# Patient Record
Sex: Female | Born: 1935
Health system: Southern US, Community
[De-identification: ages and names within clinical notes are randomized; demographics above are authoritative.]

## PROBLEM LIST (undated history)

## (undated) DIAGNOSIS — I10 Essential (primary) hypertension: Secondary | ICD-10-CM

## (undated) DIAGNOSIS — M810 Age-related osteoporosis without current pathological fracture: Secondary | ICD-10-CM

## (undated) DIAGNOSIS — Z85828 Personal history of other malignant neoplasm of skin: Secondary | ICD-10-CM

## (undated) DIAGNOSIS — E785 Hyperlipidemia, unspecified: Secondary | ICD-10-CM

## (undated) HISTORY — DX: Hyperlipidemia, unspecified: E78.5

## (undated) HISTORY — DX: Age-related osteoporosis without current pathological fracture: M81.0

## (undated) HISTORY — DX: Essential (primary) hypertension: I10

## (undated) HISTORY — DX: Personal history of other malignant neoplasm of skin: Z85.828

---

## 1996-12-08 HISTORY — PX: ABDOMINAL HYSTERECTOMY: SHX81

## 1998-10-04 ENCOUNTER — Other Ambulatory Visit: Admission: RE | Admit: 1998-10-04 | Discharge: 1998-10-04 | Payer: Self-pay | Admitting: Family Medicine

## 1999-02-27 ENCOUNTER — Encounter: Payer: Self-pay | Admitting: Family Medicine

## 1999-02-27 ENCOUNTER — Ambulatory Visit (HOSPITAL_COMMUNITY): Admission: RE | Admit: 1999-02-27 | Discharge: 1999-02-27 | Payer: Self-pay | Admitting: Family Medicine

## 1999-10-04 ENCOUNTER — Encounter: Payer: Self-pay | Admitting: Family Medicine

## 1999-10-04 ENCOUNTER — Ambulatory Visit (HOSPITAL_COMMUNITY): Admission: RE | Admit: 1999-10-04 | Discharge: 1999-10-04 | Payer: Self-pay | Admitting: Family Medicine

## 2000-01-26 ENCOUNTER — Encounter: Payer: Self-pay | Admitting: *Deleted

## 2000-01-26 ENCOUNTER — Emergency Department (HOSPITAL_COMMUNITY): Admission: EM | Admit: 2000-01-26 | Discharge: 2000-01-26 | Payer: Self-pay | Admitting: *Deleted

## 2000-01-26 ENCOUNTER — Encounter: Payer: Self-pay | Admitting: Emergency Medicine

## 2000-02-04 ENCOUNTER — Other Ambulatory Visit: Admission: RE | Admit: 2000-02-04 | Discharge: 2000-02-04 | Payer: Self-pay | Admitting: Family Medicine

## 2000-03-23 ENCOUNTER — Other Ambulatory Visit: Admission: RE | Admit: 2000-03-23 | Discharge: 2000-03-23 | Payer: Self-pay | Admitting: *Deleted

## 2000-05-06 ENCOUNTER — Encounter: Admission: RE | Admit: 2000-05-06 | Discharge: 2000-05-06 | Payer: Self-pay | Admitting: Oncology

## 2000-05-06 ENCOUNTER — Encounter: Payer: Self-pay | Admitting: Oncology

## 2000-05-21 ENCOUNTER — Encounter (INDEPENDENT_AMBULATORY_CARE_PROVIDER_SITE_OTHER): Payer: Self-pay | Admitting: Specialist

## 2000-05-21 ENCOUNTER — Ambulatory Visit (HOSPITAL_COMMUNITY): Admission: RE | Admit: 2000-05-21 | Discharge: 2000-05-21 | Payer: Self-pay | Admitting: Oncology

## 2002-06-27 ENCOUNTER — Other Ambulatory Visit: Admission: RE | Admit: 2002-06-27 | Discharge: 2002-06-27 | Payer: Self-pay | Admitting: Family Medicine

## 2004-10-07 ENCOUNTER — Ambulatory Visit: Payer: Self-pay | Admitting: Nurse Practitioner

## 2004-11-06 ENCOUNTER — Ambulatory Visit: Payer: Self-pay | Admitting: Nurse Practitioner

## 2004-11-11 ENCOUNTER — Encounter: Admission: RE | Admit: 2004-11-11 | Discharge: 2004-11-11 | Payer: Self-pay | Admitting: Cardiology

## 2004-12-11 ENCOUNTER — Ambulatory Visit: Payer: Self-pay | Admitting: Nurse Practitioner

## 2005-02-14 ENCOUNTER — Ambulatory Visit: Payer: Self-pay | Admitting: Oncology

## 2005-03-12 ENCOUNTER — Ambulatory Visit: Payer: Self-pay | Admitting: Nurse Practitioner

## 2005-04-07 ENCOUNTER — Encounter (INDEPENDENT_AMBULATORY_CARE_PROVIDER_SITE_OTHER): Payer: Self-pay | Admitting: Nurse Practitioner

## 2005-04-07 LAB — CONVERTED CEMR LAB: Pap Smear: NORMAL

## 2005-04-14 ENCOUNTER — Other Ambulatory Visit: Admission: RE | Admit: 2005-04-14 | Discharge: 2005-04-14 | Payer: Self-pay | Admitting: Family Medicine

## 2005-04-14 ENCOUNTER — Ambulatory Visit: Payer: Self-pay | Admitting: Nurse Practitioner

## 2005-11-05 ENCOUNTER — Ambulatory Visit: Payer: Self-pay | Admitting: Nurse Practitioner

## 2006-03-13 ENCOUNTER — Ambulatory Visit: Payer: Self-pay | Admitting: Oncology

## 2006-03-16 LAB — CBC WITH DIFFERENTIAL/PLATELET
Basophils Absolute: 0 10*3/uL (ref 0.0–0.1)
HCT: 35.9 % (ref 34.8–46.6)
HGB: 11.8 g/dL (ref 11.6–15.9)
MONO#: 0.6 10*3/uL (ref 0.1–0.9)
NEUT#: 2.7 10*3/uL (ref 1.5–6.5)
NEUT%: 60.5 % (ref 39.6–76.8)
RDW: 14.7 % — ABNORMAL HIGH (ref 11.3–14.5)
WBC: 4.5 10*3/uL (ref 3.9–10.0)
lymph#: 1.1 10*3/uL (ref 0.9–3.3)

## 2006-03-17 LAB — COMPREHENSIVE METABOLIC PANEL
ALT: 8 U/L (ref 0–40)
Albumin: 4 g/dL (ref 3.5–5.2)
BUN: 21 mg/dL (ref 6–23)
CO2: 27 mEq/L (ref 19–32)
Calcium: 9.7 mg/dL (ref 8.4–10.5)
Chloride: 103 mEq/L (ref 96–112)
Creatinine, Ser: 0.9 mg/dL (ref 0.4–1.2)
Potassium: 3.8 mEq/L (ref 3.5–5.3)

## 2006-03-17 LAB — IGG, IGA, IGM
IgA: 286 mg/dL (ref 68–378)
IgM, Serum: 36 mg/dL — ABNORMAL LOW (ref 60–263)

## 2006-03-25 ENCOUNTER — Ambulatory Visit: Payer: Self-pay | Admitting: Nurse Practitioner

## 2006-04-15 ENCOUNTER — Ambulatory Visit (HOSPITAL_COMMUNITY): Admission: RE | Admit: 2006-04-15 | Discharge: 2006-04-15 | Payer: Self-pay | Admitting: Internal Medicine

## 2006-04-15 ENCOUNTER — Encounter (INDEPENDENT_AMBULATORY_CARE_PROVIDER_SITE_OTHER): Payer: Self-pay | Admitting: Interventional Cardiology

## 2006-12-11 ENCOUNTER — Ambulatory Visit: Payer: Self-pay | Admitting: Nurse Practitioner

## 2007-02-08 ENCOUNTER — Ambulatory Visit: Payer: Self-pay | Admitting: Nurse Practitioner

## 2007-03-12 ENCOUNTER — Ambulatory Visit: Payer: Self-pay | Admitting: Oncology

## 2007-03-15 LAB — CBC WITH DIFFERENTIAL/PLATELET
Basophils Absolute: 0 10*3/uL (ref 0.0–0.1)
EOS%: 0.7 % (ref 0.0–7.0)
Eosinophils Absolute: 0 10*3/uL (ref 0.0–0.5)
HCT: 35.5 % (ref 34.8–46.6)
HGB: 12.1 g/dL (ref 11.6–15.9)
MCH: 27.6 pg (ref 26.0–34.0)
MONO#: 0.5 10*3/uL (ref 0.1–0.9)
NEUT#: 2.2 10*3/uL (ref 1.5–6.5)
RDW: 13.8 % (ref 11.3–14.5)
WBC: 3.9 10*3/uL (ref 3.9–10.0)
lymph#: 1.1 10*3/uL (ref 0.9–3.3)

## 2007-03-17 LAB — COMPREHENSIVE METABOLIC PANEL
ALT: 11 U/L (ref 0–35)
AST: 15 U/L (ref 0–37)
Albumin: 4.1 g/dL (ref 3.5–5.2)
BUN: 17 mg/dL (ref 6–23)
CO2: 26 mEq/L (ref 19–32)
Calcium: 9.4 mg/dL (ref 8.4–10.5)
Chloride: 102 mEq/L (ref 96–112)
Potassium: 3.8 mEq/L (ref 3.5–5.3)

## 2007-03-17 LAB — IMMUNOFIXATION ELECTROPHORESIS: IgG (Immunoglobin G), Serum: 2740 mg/dL — ABNORMAL HIGH (ref 694–1618)

## 2007-03-17 LAB — FERRITIN: Ferritin: 91 ng/mL (ref 10–291)

## 2007-07-26 ENCOUNTER — Encounter (INDEPENDENT_AMBULATORY_CARE_PROVIDER_SITE_OTHER): Payer: Self-pay | Admitting: Nurse Practitioner

## 2007-07-26 DIAGNOSIS — F329 Major depressive disorder, single episode, unspecified: Secondary | ICD-10-CM

## 2007-07-26 DIAGNOSIS — I10 Essential (primary) hypertension: Secondary | ICD-10-CM | POA: Insufficient documentation

## 2007-07-26 DIAGNOSIS — Z9889 Other specified postprocedural states: Secondary | ICD-10-CM

## 2007-07-26 DIAGNOSIS — F17200 Nicotine dependence, unspecified, uncomplicated: Secondary | ICD-10-CM | POA: Insufficient documentation

## 2007-07-26 DIAGNOSIS — R195 Other fecal abnormalities: Secondary | ICD-10-CM

## 2007-07-26 DIAGNOSIS — K219 Gastro-esophageal reflux disease without esophagitis: Secondary | ICD-10-CM

## 2007-07-26 DIAGNOSIS — D509 Iron deficiency anemia, unspecified: Secondary | ICD-10-CM

## 2008-04-07 ENCOUNTER — Ambulatory Visit: Payer: Self-pay | Admitting: Internal Medicine

## 2008-04-07 ENCOUNTER — Encounter (INDEPENDENT_AMBULATORY_CARE_PROVIDER_SITE_OTHER): Payer: Self-pay | Admitting: Nurse Practitioner

## 2008-04-07 LAB — CONVERTED CEMR LAB
Albumin: 4.1 g/dL (ref 3.5–5.2)
BUN: 16 mg/dL (ref 6–23)
CO2: 25 meq/L (ref 19–32)
Calcium: 9.7 mg/dL (ref 8.4–10.5)
Chloride: 103 meq/L (ref 96–112)
Cholesterol: 218 mg/dL — ABNORMAL HIGH (ref 0–200)
Creatinine, Ser: 0.81 mg/dL (ref 0.40–1.20)
HDL: 52 mg/dL (ref >39)
Hemoglobin: 11.8 g/dL — ABNORMAL LOW (ref 12.0–15.0)
Lymphocytes Relative: 33 % (ref 12–46)
Lymphs Abs: 1.4 10*3/uL (ref 0.7–4.0)
Monocytes Absolute: 0.4 10*3/uL (ref 0.1–1.0)
Monocytes Relative: 10 % (ref 3–12)
Neutro Abs: 2.4 10*3/uL (ref 1.7–7.7)
Potassium: 4.2 meq/L (ref 3.5–5.3)
RBC: 4.39 M/uL (ref 3.87–5.11)
Total CHOL/HDL Ratio: 4.2
WBC: 4.3 10*3/uL (ref 4.0–10.5)

## 2008-10-31 ENCOUNTER — Ambulatory Visit: Payer: Self-pay | Admitting: Internal Medicine

## 2008-12-06 ENCOUNTER — Encounter (INDEPENDENT_AMBULATORY_CARE_PROVIDER_SITE_OTHER): Payer: Self-pay | Admitting: Internal Medicine

## 2008-12-06 ENCOUNTER — Ambulatory Visit: Payer: Self-pay | Admitting: Internal Medicine

## 2008-12-06 LAB — CONVERTED CEMR LAB
ALT: <8 units/L (ref 0–35)
AST: 14 units/L (ref 0–37)
Albumin: 3.9 g/dL (ref 3.5–5.2)
BUN: 19 mg/dL (ref 6–23)
CO2: 22 meq/L (ref 19–32)
Calcium: 9.4 mg/dL (ref 8.4–10.5)
Chloride: 104 meq/L (ref 96–112)
Cholesterol: 169 mg/dL (ref 0–200)
Creatinine, Ser: 0.77 mg/dL (ref 0.40–1.20)
Potassium: 3.7 meq/L (ref 3.5–5.3)
Total CHOL/HDL Ratio: 3.5

## 2009-02-01 ENCOUNTER — Ambulatory Visit: Payer: Self-pay | Admitting: Internal Medicine

## 2009-02-01 ENCOUNTER — Encounter (INDEPENDENT_AMBULATORY_CARE_PROVIDER_SITE_OTHER): Payer: Self-pay | Admitting: Internal Medicine

## 2009-02-01 LAB — CONVERTED CEMR LAB
Basophils Relative: 1 % (ref 0–1)
Eosinophils Absolute: 0 10*3/uL (ref 0.0–0.7)
Eosinophils Relative: 1 % (ref 0–5)
HCT: 36.6 % (ref 36.0–46.0)
Hemoglobin: 11.8 g/dL — ABNORMAL LOW (ref 12.0–15.0)
Lymphs Abs: 1.2 10*3/uL (ref 0.7–4.0)
MCHC: 32.2 g/dL (ref 30.0–36.0)
MCV: 85.5 fL (ref 78.0–100.0)
Monocytes Absolute: 0.5 10*3/uL (ref 0.1–1.0)
Monocytes Relative: 14 % — ABNORMAL HIGH (ref 3–12)
Neutrophils Relative %: 53 % (ref 43–77)
RBC: 4.28 M/uL (ref 3.87–5.11)
WBC: 3.7 10*3/uL — ABNORMAL LOW (ref 4.0–10.5)

## 2009-02-13 ENCOUNTER — Encounter: Admission: RE | Admit: 2009-02-13 | Discharge: 2009-02-13 | Payer: Self-pay | Admitting: Internal Medicine

## 2009-02-14 ENCOUNTER — Encounter: Admission: RE | Admit: 2009-02-14 | Discharge: 2009-02-14 | Payer: Self-pay | Admitting: Internal Medicine

## 2009-02-16 ENCOUNTER — Encounter: Payer: Self-pay | Admitting: Internal Medicine

## 2009-02-16 ENCOUNTER — Ambulatory Visit: Payer: Self-pay | Admitting: Internal Medicine

## 2009-02-16 DIAGNOSIS — I10 Essential (primary) hypertension: Secondary | ICD-10-CM | POA: Insufficient documentation

## 2009-02-17 DIAGNOSIS — I498 Other specified cardiac arrhythmias: Secondary | ICD-10-CM

## 2009-05-03 ENCOUNTER — Ambulatory Visit: Payer: Self-pay | Admitting: Oncology

## 2009-05-08 LAB — CBC WITH DIFFERENTIAL/PLATELET
BASO%: 0.8 % (ref 0.0–2.0)
EOS%: 1 % (ref 0.0–7.0)
LYMPH%: 32.4 % (ref 14.0–49.7)
MCH: 27.8 pg (ref 25.1–34.0)
MCHC: 33 g/dL (ref 31.5–36.0)
MONO#: 0.5 10*3/uL (ref 0.1–0.9)
Platelets: 223 10*3/uL (ref 145–400)
RBC: 4.23 10*6/uL (ref 3.70–5.45)
WBC: 3.9 10*3/uL (ref 3.9–10.3)

## 2009-05-10 LAB — COMPREHENSIVE METABOLIC PANEL
ALT: 11 U/L (ref 0–35)
AST: 17 U/L (ref 0–37)
Alkaline Phosphatase: 67 U/L (ref 39–117)
CO2: 24 mEq/L (ref 19–32)
Sodium: 142 mEq/L (ref 135–145)
Total Bilirubin: 0.3 mg/dL (ref 0.3–1.2)
Total Protein: 8.6 g/dL — ABNORMAL HIGH (ref 6.0–8.3)

## 2009-05-10 LAB — IMMUNOFIXATION ELECTROPHORESIS

## 2010-12-27 ENCOUNTER — Encounter (INDEPENDENT_AMBULATORY_CARE_PROVIDER_SITE_OTHER): Payer: Self-pay | Admitting: *Deleted

## 2010-12-27 LAB — CONVERTED CEMR LAB
Alkaline Phosphatase: 72 units/L (ref 39–117)
BUN: 17 mg/dL (ref 6–23)
CO2: 28 meq/L (ref 19–32)
Creatinine, Ser: 1.05 mg/dL (ref 0.40–1.20)
Eosinophils Absolute: 0 10*3/uL (ref 0.0–0.7)
Glucose, Bld: 72 mg/dL (ref 70–99)
HDL: 45 mg/dL (ref >39)
Hemoglobin: 11.2 g/dL — ABNORMAL LOW (ref 12.0–15.0)
LDL Cholesterol: 120 mg/dL — ABNORMAL HIGH (ref 0–99)
Lymphs Abs: 1.3 10*3/uL (ref 0.7–4.0)
MCHC: 31.4 g/dL (ref 30.0–36.0)
MCV: 82.6 fL (ref 78.0–100.0)
Monocytes Absolute: 1 10*3/uL (ref 0.1–1.0)
Monocytes Relative: 11 % (ref 3–12)
Neutrophils Relative %: 74 % (ref 43–77)
RBC: 4.32 M/uL (ref 3.87–5.11)
Sodium: 138 meq/L (ref 135–145)
TSH: 2.588 microintl units/mL (ref 0.350–4.500)
Total Bilirubin: 0.3 mg/dL (ref 0.3–1.2)
Total Protein: 8.5 g/dL — ABNORMAL HIGH (ref 6.0–8.3)
Triglycerides: 86 mg/dL (ref <150)
VLDL: 17 mg/dL (ref 0–40)
WBC: 9.1 10*3/uL (ref 4.0–10.5)

## 2011-01-07 ENCOUNTER — Ambulatory Visit (HOSPITAL_COMMUNITY)
Admission: RE | Admit: 2011-01-07 | Discharge: 2011-01-07 | Payer: Self-pay | Source: Home / Self Care | Attending: Family Medicine | Admitting: Family Medicine

## 2013-03-15 ENCOUNTER — Encounter: Payer: Self-pay | Admitting: Internal Medicine

## 2013-03-15 ENCOUNTER — Ambulatory Visit (INDEPENDENT_AMBULATORY_CARE_PROVIDER_SITE_OTHER): Payer: Medicare Other | Admitting: Internal Medicine

## 2013-03-15 VITALS — BP 130/80 | HR 56 | Temp 97.8°F | Resp 20 | Ht 66.0 in | Wt 149.0 lb

## 2013-03-15 DIAGNOSIS — E785 Hyperlipidemia, unspecified: Secondary | ICD-10-CM

## 2013-03-15 DIAGNOSIS — I10 Essential (primary) hypertension: Secondary | ICD-10-CM

## 2013-03-15 DIAGNOSIS — M810 Age-related osteoporosis without current pathological fracture: Secondary | ICD-10-CM

## 2013-03-15 DIAGNOSIS — E559 Vitamin D deficiency, unspecified: Secondary | ICD-10-CM

## 2013-03-15 NOTE — Progress Notes (Signed)
  Subjective:    Patient ID: Jasmine Fletcher, female    DOB: 05-10-1931, 79 y.o.   MRN: [id number]  CC- htn, vit d deficiency  HPI Pt here with her daughter. She has been taking ca-vit d supplement. Reviewed her meds. Has been complaint. bp under control at home. Is trying to be careful with her diet by taking more fruits and vegetables. Walking for exercise. No falls reported. No other complaints   Review of Systems  Constitutional: Negative for fever, chills, activity change, appetite change and fatigue.  HENT: Negative.   Eyes: Negative.   Respiratory: Negative for cough and shortness of breath.   Cardiovascular: Negative.  Negative for chest pain, palpitations and leg swelling.  Gastrointestinal: Negative for abdominal pain.  Genitourinary: Negative for dysuria.  Neurological: Negative for dizziness and headaches.  Hematological: Negative for adenopathy.  Psychiatric/Behavioral: Negative.        Objective:   Physical Exam  Constitutional: She is oriented to person, place, and time. She appears well-developed and well-nourished. No distress.  HENT:  Head: Normocephalic and atraumatic.  Right Ear: External ear normal.  Left Ear: External ear normal.  Eyes: Conjunctivae and EOM are normal. Pupils are equal, round, and reactive to light.  Neck: Normal range of motion. Neck supple. No thyromegaly present.  Cardiovascular: Normal rate and regular rhythm.   Pulmonary/Chest: Effort normal and breath sounds normal.  Abdominal: Soft. Bowel sounds are normal.  Musculoskeletal: Normal range of motion.  Neurological: She is alert and oriented to person, place, and time.  Skin: Skin is warm and dry. She is not diaphoretic.  Psychiatric: She has a normal mood and affect.   BP 130/80  Pulse 56  Temp(Src) 97.8 F (36.6 C) (Oral)  Resp 20  Ht 5\' 6"  (1.676 m)  Wt 149 lb (67.586 kg)  BMI 24.06 kg/m2  SpO2 97%      Assessment & Plan:   Vitamin d def- completed vit d once a week  course. To continue ca-vitd and will add vit d 1000 u daily.  Hyperlipidemia- continue simvastatin for now. ldl at goal. To taper simvastatin to 10 mg daily if repeat lipid panel is at goal  HTN- continue hctz 25 mg daily. Monitor bp. Salt restriction  Osteoporosis- continue alendronate and ca-vit d supplemet, fall precautions  Labs ordered- cbc, cmp

## 2013-03-15 NOTE — Patient Instructions (Signed)
Start taking OTC vitamin D 1000 u daily from now.  Get blood work a week prior to your next visit

## 2013-05-17 ENCOUNTER — Other Ambulatory Visit: Payer: Self-pay | Admitting: Internal Medicine

## 2013-06-14 ENCOUNTER — Other Ambulatory Visit: Payer: Self-pay | Admitting: Geriatric Medicine

## 2013-06-14 MED ORDER — HYDROCHLOROTHIAZIDE 25 MG PO TABS: 25.0000 mg | ORAL_TABLET | Freq: Every day | ORAL | Status: DC

## 2013-06-14 MED ORDER — ALENDRONATE SODIUM 10 MG PO TABS: 10.0000 mg | ORAL_TABLET | Freq: Every day | ORAL | Status: DC

## 2013-07-14 ENCOUNTER — Other Ambulatory Visit: Payer: Medicare Other

## 2013-07-14 DIAGNOSIS — I10 Essential (primary) hypertension: Secondary | ICD-10-CM

## 2013-07-15 LAB — COMPREHENSIVE METABOLIC PANEL
Albumin/Globulin Ratio: 0.8 — ABNORMAL LOW (ref 1.1–2.5)
Albumin: 3.7 g/dL (ref 3.5–4.7)
Alkaline Phosphatase: 73 IU/L (ref 39–117)
BUN/Creatinine Ratio: 25 (ref 11–26)
BUN: 24 mg/dL (ref 8–27)
CO2: 27 mmol/L (ref 18–29)
Creatinine, Ser: 0.96 mg/dL (ref 0.57–1.00)
GFR calc Af Amer: 65 mL/min/{1.73_m2} (ref >59)
GFR calc non Af Amer: 56 mL/min/{1.73_m2} — ABNORMAL LOW (ref >59)
Globulin, Total: 4.8 g/dL — ABNORMAL HIGH (ref 1.5–4.5)
Total Bilirubin: 0.1 mg/dL (ref 0.0–1.2)

## 2013-07-15 LAB — CBC WITH DIFFERENTIAL/PLATELET
Basophils Absolute: 0 10*3/uL (ref 0.0–0.2)
Basos: 1 % (ref 0–3)
Eosinophils Absolute: 0.1 10*3/uL (ref 0.0–0.4)
Immature Grans (Abs): 0 10*3/uL (ref 0.0–0.1)
MCH: 26 pg — ABNORMAL LOW (ref 26.6–33.0)
MCHC: 31.9 g/dL (ref 31.5–35.7)
MCV: 82 fL (ref 79–97)
Monocytes Absolute: 0.7 10*3/uL (ref 0.1–0.9)
Neutrophils Absolute: 2.5 10*3/uL (ref 1.4–7.0)
Neutrophils Relative %: 47 % (ref 40–74)
RBC: 4.19 x10E6/uL (ref 3.77–5.28)

## 2013-07-19 ENCOUNTER — Ambulatory Visit (INDEPENDENT_AMBULATORY_CARE_PROVIDER_SITE_OTHER): Payer: 59 | Admitting: Internal Medicine

## 2013-07-19 ENCOUNTER — Encounter: Payer: Self-pay | Admitting: Internal Medicine

## 2013-07-19 VITALS — BP 122/82 | HR 54 | Temp 98.2°F | Resp 14 | Wt 147.4 lb

## 2013-07-19 DIAGNOSIS — I10 Essential (primary) hypertension: Secondary | ICD-10-CM

## 2013-07-19 DIAGNOSIS — M810 Age-related osteoporosis without current pathological fracture: Secondary | ICD-10-CM

## 2013-07-19 DIAGNOSIS — D649 Anemia, unspecified: Secondary | ICD-10-CM

## 2013-07-19 DIAGNOSIS — I498 Other specified cardiac arrhythmias: Secondary | ICD-10-CM

## 2013-07-19 DIAGNOSIS — E785 Hyperlipidemia, unspecified: Secondary | ICD-10-CM

## 2013-07-19 NOTE — Progress Notes (Signed)
Patient ID: Jasmine Fletcher, female   DOB: January 07, 1931, 79 y.o.   MRN: [id number]  Chief Complaint  Patient presents with  . Medical Managment of Chronic Issues   No Known Allergies  HPI Here by herself for routine visit. Appetite is fair.No falls reported. bp at home has been in normal range. Tolerating fosamax well.   Review of Systems  Constitutional: Negative for fever, chills, activity change, appetite change and fatigue.  HENT: Negative.  wears corrective lenses, has dentures at top Eyes: Negative.   Respiratory: Negative for cough and shortness of breath.   Cardiovascular: Negative for chest pain, palpitation. Has ankle swelling which is chronic and is more towards end of the day Gastrointestinal: Negative for abdominal pain, nausea and vomiting Genitourinary: Negative for dysuria.  Neurological: Negative for dizziness and headaches.  Hematological: Negative for adenopathy.  Psychiatric/Behavioral: Negative.    Past Medical History  Diagnosis Date  . Hypertension   . Osteoporosis   . Hyperlipidemia    BP 122/82  Pulse 54  Temp(Src) 98.2 F (36.8 C) (Oral)  Resp 14  Wt 147 lb 6.4 oz (66.86 kg)  BMI 23.8 kg/m2  Physical Exam  Constitutional: She is oriented to person, place, and time. She appears well-developed and well-nourished. No distress.  HENT:   Head: Normocephalic and atraumatic.  Eyes: Conjunctivae and EOM are normal. Pupils are equal, round, and reactive to light.  Neck: Normal range of motion. Neck supple. No thyromegaly present.  Cardiovascular: Normal rate and regular rhythm.   Pulmonary/Chest: Effort normal and breath sounds normal.  Abdominal: Soft. Bowel sounds are normal.  Musculoskeletal: Normal range of motion.  Neurological: She is alert and oriented to person, place, and time.  Skin: Skin is warm and dry. She is not diaphoretic.  Psychiatric: She has a normal mood and affect.   Labs- CBC    Component Value Date/Time   WBC 5.4 07/14/2013 0841    WBC 9.1 12/27/2010 2025   WBC 3.9 05/08/2009 1413   RBC 4.19 07/14/2013 0841   RBC 4.32 12/27/2010 2025   RBC 4.23 05/08/2009 1413   HGB 10.9* 07/14/2013 0841   HGB 11.8 05/08/2009 1413   HCT 34.2 07/14/2013 0841   HCT 35.6 05/08/2009 1413   PLT 277 12/27/2010 2025   PLT 223 05/08/2009 1413   MCV 82 07/14/2013 0841   MCV 84.3 05/08/2009 1413   MCH 26.0* 07/14/2013 0841   MCH 27.8 05/08/2009 1413   MCHC 31.9 07/14/2013 0841   MCHC 31.4 12/27/2010 2025   MCHC 33.0 05/08/2009 1413   RDW 14.8 07/14/2013 0841   RDW 14.5 12/27/2010 2025   RDW 13.5 05/08/2009 1413   LYMPHSABS 2.0 07/14/2013 0841   LYMPHSABS 1.3 12/27/2010 2025   LYMPHSABS 1.3 05/08/2009 1413   MONOABS 1.0 12/27/2010 2025   MONOABS 0.5 05/08/2009 1413   EOSABS 0.1 07/14/2013 0841   EOSABS 0.0 12/27/2010 2025   BASOSABS 0.0 07/14/2013 0841   BASOSABS 0.0 12/27/2010 2025   BASOSABS 0.0 05/08/2009 1413    CMP     Component Value Date/Time   NA 140 07/14/2013 0841   NA 138 12/27/2010 2025   K 3.8 07/14/2013 0841   CL 100 07/14/2013 0841   CO2 27 07/14/2013 0841   GLUCOSE 85 07/14/2013 0841   GLUCOSE 72 12/27/2010 2025   BUN 24 07/14/2013 0841   BUN 17 12/27/2010 2025   CREATININE 0.96 07/14/2013 0841   CALCIUM 9.2 07/14/2013 0841   PROT 8.5 07/14/2013 0841  PROT 8.5* 12/27/2010 2025   ALBUMIN 3.7 12/27/2010 2025   AST 17 07/14/2013 0841   ALT 10 07/14/2013 0841   ALKPHOS 73 07/14/2013 0841   BILITOT 0.1 07/14/2013 0841   GFRNONAA 56* 07/14/2013 0841   GFRAA 65 07/14/2013 0841   07/19/13 ekg sinus bradycardia, HR 51/min, PR interval 196, qtc 435   Assessment/plan  HTN- continue hctz 25 mg daily. Monitor bp. Salt restriction  Osteoporosis- continue alendronate and ca-vit d supplemet, fall precautions  Hyperlipidemia- continue simvastatin for now. ldl at goal.   Bradycardia- sinus, asymptomatic. Avoid AV nodal agents. Monitor clinically for now  Anemia- hemoglobin remains stable. Monitor clinically for now

## 2013-09-13 ENCOUNTER — Other Ambulatory Visit: Payer: Self-pay | Admitting: *Deleted

## 2013-09-13 MED ORDER — SIMVASTATIN 20 MG PO TABS: ORAL_TABLET | ORAL | Status: DC

## 2013-10-12 ENCOUNTER — Other Ambulatory Visit: Payer: Self-pay | Admitting: Internal Medicine

## 2013-10-12 NOTE — Telephone Encounter (Signed)
Last BMD on file 2012

## 2013-11-16 ENCOUNTER — Ambulatory Visit: Payer: Medicare Other | Admitting: Internal Medicine

## 2013-11-29 ENCOUNTER — Encounter: Payer: Self-pay | Admitting: Internal Medicine

## 2013-11-29 ENCOUNTER — Ambulatory Visit (INDEPENDENT_AMBULATORY_CARE_PROVIDER_SITE_OTHER): Payer: 59 | Admitting: Internal Medicine

## 2013-11-29 VITALS — BP 120/74 | HR 60 | Temp 98.1°F | Wt 149.4 lb

## 2013-11-29 DIAGNOSIS — D509 Iron deficiency anemia, unspecified: Secondary | ICD-10-CM

## 2013-11-29 DIAGNOSIS — K219 Gastro-esophageal reflux disease without esophagitis: Secondary | ICD-10-CM

## 2013-11-29 DIAGNOSIS — M810 Age-related osteoporosis without current pathological fracture: Secondary | ICD-10-CM

## 2013-11-29 DIAGNOSIS — E785 Hyperlipidemia, unspecified: Secondary | ICD-10-CM

## 2013-11-29 DIAGNOSIS — E782 Mixed hyperlipidemia: Secondary | ICD-10-CM | POA: Insufficient documentation

## 2013-11-29 DIAGNOSIS — I10 Essential (primary) hypertension: Secondary | ICD-10-CM

## 2013-11-29 NOTE — Progress Notes (Signed)
Patient ID: Jasmine Fletcher, female   DOB: 07-15-1931, 79 y.o.   MRN: [id number]    No Known Allergies  Chief Complaint  Patient presents with  . Medical Managment of Chronic Issues    4 month f/u    HPI 79 y/o female patient is here for RV. She denies any complaints. She exercise on daily basis and tries to eat healthy. No falls reported. No skin concerns  Review of Systems   Constitutional: Negative for fever, chills, activity change, appetite change and fatigue.   HENT: Negative.  wears corrective lenses, has dentures at top Eyes: Negative.    Respiratory: Negative for cough and shortness of breath.    Cardiovascular: Negative for chest pain, palpitation. Has ankle swelling which is chronic and is more towards end of the day Gastrointestinal: Negative for abdominal pain, nausea and vomiting Genitourinary: Negative for dysuria.   Neurological: Negative for dizziness and headaches.   Hematological: Negative for adenopathy.   Psychiatric/Behavioral: Negative.    Past Medical History  Diagnosis Date  . Hypertension   . Osteoporosis   . Hyperlipidemia    Medication reviewed. See [facility]  Past Surgical History  Procedure Laterality Date  . Abdominal hysterectomy  1998   Physical exam BP 120/74  Pulse 60  Temp(Src) 98.1 F (36.7 C) (Oral)  Wt 149 lb 6.4 oz (67.767 kg)  SpO2 99%  General- elderly female in no acute distress Head- atraumatic, normocephalic Eyes- PERRLA, EOMI, no pallor, no icterus, no discharge Neck- no lymphadenopathy, no thyromegaly, no jugular vein distension, no carotid bruit Ears- left ear normal tympanic membrane and normal external ear canal , right ear normal tympanic membrane and normal external ear canal Cardiovascular- normal s1,s2, no murmurs/ rubs/ gallops Respiratory- bilateral clear to auscultation, no wheeze, no rhonchi, no crackles Abdomen- bowel sounds present, soft, non tender Musculoskeletal- able to move all 4 extremities Neurological-  no focal deficit Psychiatry- alert and oriented to person, place and time, normal mood and affect  Labs reviewed CBC    Component Value Date/Time   WBC 5.4 07/14/2013 0841   WBC 9.1 12/27/2010 2025   WBC 3.9 05/08/2009 1413   RBC 4.19 07/14/2013 0841   RBC 4.32 12/27/2010 2025   RBC 4.23 05/08/2009 1413   HGB 10.9* 07/14/2013 0841   HGB 11.8 05/08/2009 1413   HCT 34.2 07/14/2013 0841   HCT 35.6 05/08/2009 1413   PLT 277 12/27/2010 2025   PLT 223 05/08/2009 1413   MCV 82 07/14/2013 0841   MCV 84.3 05/08/2009 1413   MCH 26.0* 07/14/2013 0841   MCH 27.8 05/08/2009 1413   MCHC 31.9 07/14/2013 0841   MCHC 31.4 12/27/2010 2025   MCHC 33.0 05/08/2009 1413   RDW 14.8 07/14/2013 0841   RDW 14.5 12/27/2010 2025   RDW 13.5 05/08/2009 1413   LYMPHSABS 2.0 07/14/2013 0841   LYMPHSABS 1.3 12/27/2010 2025   LYMPHSABS 1.3 05/08/2009 1413   MONOABS 1.0 12/27/2010 2025   MONOABS 0.5 05/08/2009 1413   EOSABS 0.1 07/14/2013 0841   EOSABS 0.0 12/27/2010 2025   BASOSABS 0.0 07/14/2013 0841   BASOSABS 0.0 12/27/2010 2025   BASOSABS 0.0 05/08/2009 1413    CMP     Component Value Date/Time   NA 140 07/14/2013 0841   NA 138 12/27/2010 2025   K 3.8 07/14/2013 0841   CL 100 07/14/2013 0841   CO2 27 07/14/2013 0841   GLUCOSE 85 07/14/2013 0841   GLUCOSE 72 12/27/2010 2025   BUN 24  07/14/2013 0841   BUN 17 12/27/2010 2025   CREATININE 0.96 07/14/2013 0841   CALCIUM 9.2 07/14/2013 0841   PROT 8.5 07/14/2013 0841   PROT 8.5* 12/27/2010 2025   ALBUMIN 3.7 12/27/2010 2025   AST 17 07/14/2013 0841   ALT 10 07/14/2013 0841   ALKPHOS 73 07/14/2013 0841   BILITOT 0.1 07/14/2013 0841   GFRNONAA 56* 07/14/2013 0841   GFRAA 65 07/14/2013 0841    Assessment/plan  1. HYPERTENSION bp well controlled. Continue hydrochlorthiazide and aspirin - CMP; Future - CBC with Differential; Future  2. GERD Currently off all medication and symptoms under control  3. Osteoporosis, unspecified Continue alendronate with calcium and vitamin d, fall precautions and weight bearing  exercise  4. ANEMIA, HYPOCHROMIC Check cbc in a week to rule out worsening anemia. Currently asymptomatic. Continue MVI - CBC with Differential; Future  5. Other and unspecified hyperlipidemia Continue zocor 20 mg daily. Pt not fasting currently. Will check flp in 1-2 weeks with cmp - Lipid Panel; Future

## 2014-01-11 ENCOUNTER — Other Ambulatory Visit: Payer: Self-pay | Admitting: Internal Medicine

## 2014-01-29 ENCOUNTER — Emergency Department (HOSPITAL_COMMUNITY): Payer: PRIVATE HEALTH INSURANCE

## 2014-01-29 ENCOUNTER — Emergency Department (HOSPITAL_COMMUNITY)
Admission: EM | Admit: 2014-01-29 | Discharge: 2014-01-29 | Disposition: A | Discharge status: Home / Self Care | Payer: PRIVATE HEALTH INSURANCE | Attending: Emergency Medicine | Admitting: Emergency Medicine

## 2014-01-29 ENCOUNTER — Encounter (HOSPITAL_COMMUNITY): Payer: Self-pay | Admitting: Emergency Medicine

## 2014-01-29 DIAGNOSIS — I10 Essential (primary) hypertension: Secondary | ICD-10-CM | POA: Insufficient documentation

## 2014-01-29 DIAGNOSIS — D649 Anemia, unspecified: Secondary | ICD-10-CM

## 2014-01-29 DIAGNOSIS — R42 Dizziness and giddiness: Secondary | ICD-10-CM | POA: Insufficient documentation

## 2014-01-29 DIAGNOSIS — J069 Acute upper respiratory infection, unspecified: Secondary | ICD-10-CM | POA: Insufficient documentation

## 2014-01-29 DIAGNOSIS — Z79899 Other long term (current) drug therapy: Secondary | ICD-10-CM | POA: Insufficient documentation

## 2014-01-29 DIAGNOSIS — Z8739 Personal history of other diseases of the musculoskeletal system and connective tissue: Secondary | ICD-10-CM | POA: Insufficient documentation

## 2014-01-29 DIAGNOSIS — B9789 Other viral agents as the cause of diseases classified elsewhere: Secondary | ICD-10-CM

## 2014-01-29 DIAGNOSIS — R110 Nausea: Secondary | ICD-10-CM | POA: Insufficient documentation

## 2014-01-29 DIAGNOSIS — R630 Anorexia: Secondary | ICD-10-CM | POA: Insufficient documentation

## 2014-01-29 DIAGNOSIS — E860 Dehydration: Secondary | ICD-10-CM | POA: Insufficient documentation

## 2014-01-29 DIAGNOSIS — Z87891 Personal history of nicotine dependence: Secondary | ICD-10-CM | POA: Insufficient documentation

## 2014-01-29 DIAGNOSIS — E785 Hyperlipidemia, unspecified: Secondary | ICD-10-CM | POA: Insufficient documentation

## 2014-01-29 LAB — URINALYSIS, ROUTINE W REFLEX MICROSCOPIC
Bilirubin Urine: NEGATIVE
GLUCOSE, UA: NEGATIVE mg/dL
Ketones, ur: NEGATIVE mg/dL
LEUKOCYTES UA: NEGATIVE
Nitrite: NEGATIVE
Protein, ur: NEGATIVE mg/dL
SPECIFIC GRAVITY, URINE: 1.008 (ref 1.005–1.030)
Urobilinogen, UA: 0.2 mg/dL (ref 0.0–1.0)
pH: 6.5 (ref 5.0–8.0)

## 2014-01-29 LAB — COMPREHENSIVE METABOLIC PANEL
ALBUMIN: 3.2 g/dL — AB (ref 3.5–5.2)
ALT: 10 U/L (ref 0–35)
AST: 17 U/L (ref 0–37)
Alkaline Phosphatase: 69 U/L (ref 39–117)
BILIRUBIN TOTAL: 0.2 mg/dL — AB (ref 0.3–1.2)
BUN: 25 mg/dL — AB (ref 6–23)
CALCIUM: 9.1 mg/dL (ref 8.4–10.5)
CHLORIDE: 96 meq/L (ref 96–112)
CO2: 26 mEq/L (ref 19–32)
CREATININE: 0.93 mg/dL (ref 0.50–1.10)
GFR calc Af Amer: 66 mL/min — ABNORMAL LOW (ref >90)
GFR calc non Af Amer: 57 mL/min — ABNORMAL LOW (ref >90)
Glucose, Bld: 99 mg/dL (ref 70–99)
Potassium: 3.5 mEq/L — ABNORMAL LOW (ref 3.7–5.3)
Sodium: 135 mEq/L — ABNORMAL LOW (ref 137–147)
TOTAL PROTEIN: 9.5 g/dL — AB (ref 6.0–8.3)

## 2014-01-29 LAB — CBC WITH DIFFERENTIAL/PLATELET
BASOS ABS: 0 10*3/uL (ref 0.0–0.1)
Basophils Relative: 1 % (ref 0–1)
EOS ABS: 0 10*3/uL (ref 0.0–0.7)
EOS PCT: 0 % (ref 0–5)
HEMATOCRIT: 32.9 % — AB (ref 36.0–46.0)
HEMOGLOBIN: 10.6 g/dL — AB (ref 12.0–15.0)
Lymphocytes Relative: 33 % (ref 12–46)
Lymphs Abs: 1.4 10*3/uL (ref 0.7–4.0)
MCH: 26 pg (ref 26.0–34.0)
MCHC: 32.2 g/dL (ref 30.0–36.0)
MCV: 80.8 fL (ref 78.0–100.0)
MONO ABS: 0.6 10*3/uL (ref 0.1–1.0)
MONOS PCT: 14 % — AB (ref 3–12)
Neutro Abs: 2.1 10*3/uL (ref 1.7–7.7)
Neutrophils Relative %: 51 % (ref 43–77)
Platelets: 200 10*3/uL (ref 150–400)
RBC: 4.07 MIL/uL (ref 3.87–5.11)
RDW: 14 % (ref 11.5–15.5)
WBC: 4 10*3/uL (ref 4.0–10.5)

## 2014-01-29 LAB — URINE MICROSCOPIC-ADD ON

## 2014-01-29 MED ORDER — POTASSIUM CHLORIDE CRYS ER 20 MEQ PO TBCR
40.0000 meq | EXTENDED_RELEASE_TABLET | Freq: Once | ORAL | Status: AC
  Administered 2014-01-29: 40 meq via ORAL
  Filled 2014-01-29: qty 2

## 2014-01-29 MED ORDER — SODIUM CHLORIDE 0.9 % IV BOLUS (SEPSIS)
1000.0000 mL | Freq: Once | INTRAVENOUS | Status: AC
  Administered 2014-01-29: 1000 mL via INTRAVENOUS

## 2014-01-29 MED ORDER — ONDANSETRON HCL 4 MG/2ML IJ SOLN
4.0000 mg | Freq: Once | INTRAMUSCULAR | Status: AC
  Administered 2014-01-29: 4 mg via INTRAVENOUS
  Filled 2014-01-29: qty 2

## 2014-01-29 MED ORDER — ONDANSETRON HCL 4 MG PO TABS: 4.0000 mg | ORAL_TABLET | Freq: Four times a day (QID) | ORAL | Status: DC

## 2014-01-29 MED ORDER — BENZONATATE 100 MG PO CAPS: 100.0000 mg | ORAL_CAPSULE | Freq: Three times a day (TID) | ORAL | Status: DC

## 2014-01-29 NOTE — ED Notes (Signed)
Bed: YQ03 Expected date: 01/29/14 Expected time: 9:59 AM Means of arrival:  Comments: Flu like symptoms

## 2014-01-29 NOTE — ED Provider Notes (Signed)
CSN: [id number]     Arrival date & time 01/29/14  1001 History   First MD Initiated Contact with Patient 01/29/14 1017     Chief Complaint  Patient presents with  . Weakness  . Dizziness  . Nausea     (Consider location/radiation/quality/duration/timing/severity/associated sxs/prior Treatment) Patient is a 80 y.o. female presenting with URI. The history is provided by the patient and a relative. No language interpreter was used.  URI Presenting symptoms: congestion, cough, fatigue and rhinorrhea   Presenting symptoms: no fever and no sore throat   Congestion:    Location:  Nasal and chest   Interferes with sleep: no     Interferes with eating/drinking: no   Cough:    Cough characteristics:  Dry   Severity:  Moderate   Onset quality:  Gradual   Duration:  1 week   Timing:  Constant   Progression:  Unchanged   Chronicity:  New Severity:  Moderate Onset quality:  Gradual Duration:  1 week Timing:  Constant Progression:  Unchanged Chronicity:  New Relieved by:  Nothing Worsened by:  Nothing tried Ineffective treatments:  None tried Associated symptoms: no arthralgias, no headaches, no myalgias, no neck pain, no sinus pain, no sneezing, no swollen glands and no wheezing   Associated symptoms comment:  Generalized weakness, 3 episodes of lightheadedness while standing.  Risk factors: being elderly and sick contacts   Risk factors: no diabetes mellitus, no immunosuppression, no recent illness and no recent travel     Past Medical History  Diagnosis Date  . Hypertension   . Osteoporosis   . Hyperlipidemia    Past Surgical History  Procedure Laterality Date  . Abdominal hysterectomy  1998   Family History  Problem Relation Age of Onset  . Cancer Father    History  Substance Use Topics  . Smoking status: Former Smoker -- 1.00 packs/day  . Smokeless tobacco: Not on file  . Alcohol Use: No   OB History   Grav Para Term Preterm Abortions TAB SAB Ect Mult Living               Review of Systems  Constitutional: Positive for fatigue. Negative for fever, chills, diaphoresis, activity change and appetite change.  HENT: Positive for congestion and rhinorrhea. Negative for facial swelling, sneezing and sore throat.   Eyes: Negative for photophobia and discharge.  Respiratory: Positive for cough. Negative for chest tightness, shortness of breath and wheezing.   Cardiovascular: Negative for chest pain, palpitations and leg swelling.  Gastrointestinal: Negative for nausea, vomiting, abdominal pain and diarrhea.  Endocrine: Negative for polydipsia and polyuria.  Genitourinary: Negative for dysuria, frequency, difficulty urinating and pelvic pain.  Musculoskeletal: Negative for arthralgias, back pain, myalgias, neck pain and neck stiffness.  Skin: Negative for color change and wound.  Allergic/Immunologic: Negative for immunocompromised state.  Neurological: Negative for facial asymmetry, weakness, numbness and headaches.  Hematological: Does not bruise/bleed easily.  Psychiatric/Behavioral: Negative for confusion and agitation.      Allergies  Review of patient's allergies indicates no known allergies.  Home Medications   Current Outpatient Rx  Name  Route  Sig  Dispense  Refill  . alendronate (FOSAMAX) 10 MG tablet   Oral   Take 10 mg by mouth daily before breakfast. BONE DENSITY DUE, 1 by mouth daily on empty stomach with a FULL GLASS OF WATER         . hydrochlorothiazide (HYDRODIURIL) 25 MG tablet   Oral   Take 25 mg  by mouth daily.         . simvastatin (ZOCOR) 20 MG tablet   Oral   Take 20 mg by mouth daily.         . benzonatate (TESSALON) 100 MG capsule   Oral   Take 1 capsule (100 mg total) by mouth every 8 (eight) hours.   21 capsule   0   . ondansetron (ZOFRAN) 4 MG tablet   Oral   Take 1 tablet (4 mg total) by mouth every 6 (six) hours.   12 tablet   0    BP 149/72  Pulse 68  Temp(Src) 97.9 F (36.6 C) (Oral)   Resp 16  SpO2 96% Physical Exam  Constitutional: She is oriented to person, place, and time. She appears well-developed and well-nourished. No distress.  HENT:  Head: Normocephalic and atraumatic.  Mouth/Throat: No oropharyngeal exudate.  Eyes: Pupils are equal, round, and reactive to light.  Neck: Normal range of motion. Neck supple.  Cardiovascular: Normal rate, regular rhythm and normal heart sounds.  Exam reveals no gallop and no friction rub.   No murmur heard. Pulmonary/Chest: Effort normal and breath sounds normal. No respiratory distress. She has no wheezes. She has no rales.  Abdominal: Soft. Bowel sounds are normal. She exhibits no distension and no mass. There is no tenderness. There is no rebound and no guarding.  Musculoskeletal: Normal range of motion. She exhibits no edema and no tenderness.  Neurological: She is alert and oriented to person, place, and time. She has normal strength. She displays no tremor. No cranial nerve deficit or sensory deficit. She exhibits normal muscle tone. She displays a negative Romberg sign. Coordination and gait normal. GCS eye subscore is 4. GCS verbal subscore is 5. GCS motor subscore is 6.  Skin: Skin is warm and dry.  Psychiatric: She has a normal mood and affect.    ED Course  Procedures (including critical care time) Labs Review Labs Reviewed  CBC WITH DIFFERENTIAL - Abnormal; Notable for the following:    Hemoglobin 10.6 (*)    HCT 32.9 (*)    Monocytes Relative 14 (*)    All other components within normal limits  COMPREHENSIVE METABOLIC PANEL - Abnormal; Notable for the following:    Sodium 135 (*)    Potassium 3.5 (*)    BUN 25 (*)    Total Protein 9.5 (*)    Albumin 3.2 (*)    Total Bilirubin 0.2 (*)    GFR calc non Af Amer 57 (*)    GFR calc Af Amer 66 (*)    All other components within normal limits  URINALYSIS, ROUTINE W REFLEX MICROSCOPIC - Abnormal; Notable for the following:    Hgb urine dipstick MODERATE (*)    All  other components within normal limits  URINE CULTURE  URINE MICROSCOPIC-ADD ON   Imaging Review Dg Chest 2 View  01/29/2014   CLINICAL DATA:  Cough and congestion for 1 week  EXAM: CHEST  2 VIEW  COMPARISON:  None.  FINDINGS: Mild cardiac enlargement. No pleural effusion or edema identified. No airspace consolidation. The lungs are hyperinflated and there are coarsened interstitial markings bilaterally. Biapical scarring is noted. On the lateral radiograph there is an indeterminate nodular opacity in the projection of the thoracic spine measuring 2.1 cm.  IMPRESSION: 1. No acute findings. 2. Indeterminate nodular density in the projection of the upper thoracic spine. Suggest further assessment with nonemergent CT of the chest. 3. COPD.   Electronically  Signed   By: Kerby Moors M.D.   On: 01/29/2014 11:26    EKG Interpretation   None       MDM   Final diagnoses:  Viral URI with cough  Lightheadedness  Mild dehydration  Anemia    Pt is a 80 y.o. female with Pmhx as above who presents with about 1 week of dry cough, congestion, rhinorrhea, anorexia, generalized weakness, and now with several episodes of lightheadedness while standing. No CP, SOB, palpitations, syncope, n/v, d/a, urinary symptoms. On PE, VSS pt in NAD.  No focal neuro findings, though pt did have lightheadedness that was exhaustable when standing. W/U with mild hyponatermia, hypokalemia, mild BUN elevation. CXR negative.  Suspect viral URI/influenza-like illness.  Pt given 1L NS, PO KCL, then ambulated to bathroom several times w/o difficulty.  Will d/c home w/ rx for tessalon & zofran.  Have rec supportive care. Return precautions given for new or worsening symptoms including SOB, CP, inability to tolerate PO.          Neta Ehlers, MD 01/29/14 1800

## 2014-01-29 NOTE — ED Notes (Signed)
Ambulated Pt to restroom, She did very well ambulating to and from..no need for much assistance.

## 2014-01-29 NOTE — Discharge Instructions (Signed)
Dizziness Dizziness is a common problem. It is a feeling of unsteadiness or lightheadedness. You may feel like you are about to faint. Dizziness can lead to injury if you stumble or fall. A person of any age group can suffer from dizziness, but dizziness is more common in older adults. CAUSES  Dizziness can be caused by many different things, including:  Middle ear problems.  Standing for too long.  Infections.  An allergic reaction.  Aging.  An emotional response to something, such as the sight of blood.  Side effects of medicines.  Fatigue.  Problems with circulation or blood pressure.  Excess use of alcohol, medicines, or illegal drug use.  Breathing too fast (hyperventilation).  An arrhythmia or problems with your heart rhythm.  Low red blood cell count (anemia).  Pregnancy.  Vomiting, diarrhea, fever, or other illnesses that cause dehydration.  Diseases or conditions such as Parkinson's disease, high blood pressure (hypertension), diabetes, and thyroid problems.  Exposure to extreme heat. DIAGNOSIS  To find the cause of your dizziness, your caregiver may do a physical exam, lab tests, radiologic imaging scans, or an electrocardiography test (ECG).  TREATMENT  Treatment of dizziness depends on the cause of your symptoms and can vary greatly. HOME CARE INSTRUCTIONS   Drink enough fluids to keep your urine clear or pale yellow. This is especially important in very hot weather. In the elderly, it is also important in cold weather.  If your dizziness is caused by medicines, take them exactly as directed. When taking blood pressure medicines, it is especially important to get up slowly.  Rise slowly from chairs and steady yourself until you feel okay.  In the morning, first sit up on the side of the bed. When this seems okay, stand slowly while holding onto something until you know your balance is fine.  If you need to stand in one place for a long time, be sure to  move your legs often. Tighten and relax the muscles in your legs while standing.  If dizziness continues to be a problem, have someone stay with you for a day or two. Do this until you feel you are well enough to stay alone. Have the person call your caregiver if he or she notices changes in you that are concerning.  Do not drive or use heavy machinery if you feel dizzy.  Do not drink alcohol. SEEK IMMEDIATE MEDICAL CARE IF:   Your dizziness or lightheadedness gets worse.  You feel nauseous or vomit.  You develop problems with talking, walking, weakness, or using your arms, hands, or legs.  You are not thinking clearly or you have difficulty forming sentences. It may take a friend or family member to determine if your thinking is normal.  You develop chest pain, abdominal pain, shortness of breath, or sweating.  Your vision changes.  You notice any bleeding.  You have side effects from medicine that seems to be getting worse rather than better. MAKE SURE YOU:   Understand these instructions.  Will watch your condition.  Will get help right away if you are not doing well or get worse. Document Released: 05/20/2001 Document Revised: 02/16/2012 Document Reviewed: 06/13/2011 [facility] Patient Information 2014 [place], [place]. Upper Respiratory Infection, Adult An upper respiratory infection (URI) is also known as the common cold. It is often caused by a type of germ (virus). Colds are easily spread (contagious). You can pass it to others by kissing, coughing, sneezing, or drinking out of the same glass. Usually, you  get better in 1 or 2 weeks.  HOME CARE   Only take medicine as told by your doctor.  Use a warm mist humidifier or breathe in steam from a hot shower.  Drink enough water and fluids to keep your pee (urine) clear or pale yellow.  Get plenty of rest.  Return to work when your temperature is back to normal or as told by your doctor. You may use a face mask and wash  your hands to stop your cold from spreading. GET HELP RIGHT AWAY IF:   After the first few days, you feel you are getting worse.  You have questions about your medicine.  You have chills, shortness of breath, or brown or red spit (mucus).  You have yellow or brown snot (nasal discharge) or pain in the face, especially when you bend forward.  You have a fever, puffy (swollen) neck, pain when you swallow, or white spots in the back of your throat.  You have a bad headache, ear pain, sinus pain, or chest pain.  You have a high-pitched whistling sound when you breathe in and out (wheezing).  You have a lasting cough or cough up blood.  You have sore muscles or a stiff neck. MAKE SURE YOU:   Understand these instructions.  Will watch your condition.  Will get help right away if you are not doing well or get worse. Document Released: 05/12/2008 Document Revised: 02/16/2012 Document Reviewed: 03/31/2011 [facility] Patient Information 2014 [place], [place].

## 2014-01-29 NOTE — ED Notes (Signed)
Pt from home c/o dry cough, nausea, inability to eat, weakness x3 days. Pt denies CP, SOB, abd pain, dysuria, frequency. Pt is A&O and in NAD

## 2014-01-29 NOTE — ED Notes (Signed)
Per EMS-pt from home with c/o of genearlized weakness, nausea when eating, dizziness upon standing. Denies chest pain, SOB. CBG 120.

## 2014-01-30 LAB — URINE CULTURE
Colony Count: NO GROWTH
Culture: NO GROWTH

## 2014-02-01 ENCOUNTER — Ambulatory Visit (INDEPENDENT_AMBULATORY_CARE_PROVIDER_SITE_OTHER): Payer: 59 | Admitting: Nurse Practitioner

## 2014-02-01 ENCOUNTER — Encounter: Payer: Self-pay | Admitting: Nurse Practitioner

## 2014-02-01 VITALS — BP 118/78 | HR 68 | Temp 97.8°F | Resp 16 | Wt 145.6 lb

## 2014-02-01 DIAGNOSIS — B9789 Other viral agents as the cause of diseases classified elsewhere: Principal | ICD-10-CM

## 2014-02-01 DIAGNOSIS — J069 Acute upper respiratory infection, unspecified: Secondary | ICD-10-CM

## 2014-02-01 NOTE — Progress Notes (Signed)
Patient ID: Jasmine Fletcher, female   DOB: [birth year], 80 y.o.   MRN: [id number]    PCP: Blanchie Serve, MD  No Known Allergies  Chief Complaint  Patient presents with  . Hospitalization Follow-up    ER f/u from 01/29/14 cough/Anemia/lightheadedness/mild dehydration/SOB    HPI:  Pt is a 80 y.o. female with Pmhx of HTN, OP, anemia, previous smoker, hyperlipidemia who went to the ED on 01/29/14  with about 1 week of dry cough, congestion, rhinorrhea, anorexia, generalized weakness, and now with several episodes of lightheadedness while standing; ED doctor Suspect viral URI/influenza-like illness and she was given 1L NS, PO KCL, then ambulated to bathroom several times w/o difficulty.  was d/c home w/ rx for tessalon & zofran and was rec supportive care. She is here today to follow up; doing okay but still feels weak has been laying around since she has not felt well. Cough and congestion has improved; able to maintain good PO intake.    Review of Systems:  Review of Systems  Constitutional: Negative for fever, chills and malaise/fatigue.  HENT: Negative for congestion and sore throat.   Respiratory: Positive for cough. Negative for shortness of breath and wheezing.   Cardiovascular: Negative for chest pain and palpitations.  Gastrointestinal: Negative for nausea, vomiting, abdominal pain, diarrhea and constipation.  Genitourinary: Negative for dysuria and urgency.  Musculoskeletal: Negative for myalgias.  Neurological: Positive for weakness. Negative for dizziness and headaches.     Past Medical History  Diagnosis Date  . Hypertension   . Osteoporosis   . Hyperlipidemia    Past Surgical History  Procedure Laterality Date  . Abdominal hysterectomy  1998   Social History:   reports that she has quit smoking. She does not have any smokeless tobacco history on file. She reports that she does not drink alcohol. Her drug history is not on file.  Family History  Problem Relation Age  of Onset  . Cancer Father     Medications: Patient's Medications  New Prescriptions   No medications on file  Previous Medications   ALENDRONATE (FOSAMAX) 10 MG TABLET    Take 10 mg by mouth daily before breakfast. BONE DENSITY DUE, 1 by mouth daily on empty stomach with a FULL GLASS OF WATER   BENZONATATE (TESSALON) 100 MG CAPSULE    Take 1 capsule (100 mg total) by mouth every 8 (eight) hours.   HYDROCHLOROTHIAZIDE (HYDRODIURIL) 25 MG TABLET    Take 25 mg by mouth daily.   ONDANSETRON (ZOFRAN) 4 MG TABLET    Take 1 tablet (4 mg total) by mouth every 6 (six) hours.   SIMVASTATIN (ZOCOR) 20 MG TABLET    Take 20 mg by mouth daily.  Modified Medications   No medications on file  Discontinued Medications   No medications on file     Physical Exam:  Filed Vitals:   02/01/14 0929  BP: 118/78  Pulse: 68  Temp: 97.8 F (36.6 C)  TempSrc: Oral  Resp: 16  Weight: 145 lb 9.6 oz (66.044 kg)  SpO2: 97%    Physical Exam  Vitals reviewed. Constitutional: She is well-developed, well-nourished, and in no distress.  HENT:  Mouth/Throat: Oropharynx is clear and moist. No oropharyngeal exudate.  Eyes: Conjunctivae and EOM are normal. Pupils are equal, round, and reactive to light.  Neck: Normal range of motion. Neck supple.  Cardiovascular: Normal rate, regular rhythm and normal heart sounds.   Pulmonary/Chest: Effort normal and breath sounds normal. No respiratory distress.  Abdominal: Soft. Bowel sounds are normal. She exhibits no distension.  Musculoskeletal: She exhibits no edema and no tenderness.  Neurological: She is alert.  Skin: Skin is warm and dry.  Psychiatric: Affect normal.     Labs reviewed: Basic Metabolic Panel:  Recent Labs  07/14/13 0841 01/29/14 1024  NA 140 135*  K 3.8 3.5*  CL 100 96  CO2 27 26  GLUCOSE 85 99  BUN 24 25*  CREATININE 0.96 0.93  CALCIUM 9.2 9.1   Liver Function Tests:  Recent Labs  07/14/13 0841 01/29/14 1024  AST 17 17  ALT  10 10  ALKPHOS 73 69  BILITOT 0.1 0.2*  PROT 8.5 9.5*  ALBUMIN  --  3.2*   CBC:  Recent Labs  07/14/13 0841 01/29/14 1024  WBC 5.4 4.0  NEUTROABS 2.5 2.1  HGB 10.9* 10.6*  HCT 34.2 32.9*  MCV 82 80.8  PLT  --  200     Assessment/Plan  1. Viral URI with cough -improved; cont symptom management -to increase activity slowly -encouraged good nutrition and PO intake - Basic metabolic panel to follow up electrolytes -discussed return precautions

## 2014-02-01 NOTE — Patient Instructions (Signed)
Increase activity slowly; eat 3 meals a day with snacks; important to have good nutrition to help you regain strength  Follow up if symptoms get worse   Will get blood work today

## 2014-02-02 LAB — BASIC METABOLIC PANEL
BUN/Creatinine Ratio: 17 (ref 11–26)
BUN: 16 mg/dL (ref 8–27)
CO2: 25 mmol/L (ref 18–29)
Calcium: 8.8 mg/dL (ref 8.7–10.3)
Chloride: 92 mmol/L — ABNORMAL LOW (ref 97–108)
Creatinine, Ser: 0.93 mg/dL (ref 0.57–1.00)
GFR, EST AFRICAN AMERICAN: 67 mL/min/{1.73_m2} (ref >59)
GFR, EST NON AFRICAN AMERICAN: 58 mL/min/{1.73_m2} — AB (ref >59)
Glucose: 97 mg/dL (ref 65–99)
POTASSIUM: 3.8 mmol/L (ref 3.5–5.2)
Sodium: 133 mmol/L — ABNORMAL LOW (ref 134–144)

## 2014-02-08 ENCOUNTER — Other Ambulatory Visit: Payer: Self-pay | Admitting: Internal Medicine

## 2014-02-08 ENCOUNTER — Encounter: Payer: Self-pay | Admitting: *Deleted

## 2014-03-24 ENCOUNTER — Other Ambulatory Visit: Payer: 59

## 2014-03-24 DIAGNOSIS — I10 Essential (primary) hypertension: Secondary | ICD-10-CM

## 2014-03-24 DIAGNOSIS — D509 Iron deficiency anemia, unspecified: Secondary | ICD-10-CM

## 2014-03-24 DIAGNOSIS — E785 Hyperlipidemia, unspecified: Secondary | ICD-10-CM

## 2014-03-25 LAB — CBC WITH DIFFERENTIAL/PLATELET
Basophils Absolute: 0 10*3/uL (ref 0.0–0.2)
Basos: 1 %
EOS: 1 %
Eosinophils Absolute: 0 10*3/uL (ref 0.0–0.4)
HEMATOCRIT: 33.2 % — AB (ref 34.0–46.6)
Hemoglobin: 10.9 g/dL — ABNORMAL LOW (ref 11.1–15.9)
Immature Grans (Abs): 0 10*3/uL (ref 0.0–0.1)
Immature Granulocytes: 0 %
LYMPHS ABS: 1.2 10*3/uL (ref 0.7–3.1)
Lymphs: 28 %
MCH: 26.4 pg — ABNORMAL LOW (ref 26.6–33.0)
MCHC: 32.8 g/dL (ref 31.5–35.7)
MCV: 80 fL (ref 79–97)
Monocytes Absolute: 0.7 10*3/uL (ref 0.1–0.9)
Monocytes: 17 %
NEUTROS ABS: 2.3 10*3/uL (ref 1.4–7.0)
Neutrophils Relative %: 53 %
RBC: 4.13 x10E6/uL (ref 3.77–5.28)
RDW: 15.2 % (ref 12.3–15.4)
WBC: 4.3 10*3/uL (ref 3.4–10.8)

## 2014-03-25 LAB — LIPID PANEL
CHOL/HDL RATIO: 3.2 ratio (ref 0.0–4.4)
Cholesterol, Total: 145 mg/dL (ref 100–199)
HDL: 46 mg/dL (ref >39)
LDL Calculated: 74 mg/dL (ref 0–99)
Triglycerides: 127 mg/dL (ref 0–149)
VLDL Cholesterol Cal: 25 mg/dL (ref 5–40)

## 2014-03-25 LAB — COMPREHENSIVE METABOLIC PANEL
A/G RATIO: 0.7 — AB (ref 1.1–2.5)
ALK PHOS: 67 IU/L (ref 39–117)
ALT: 9 IU/L (ref 0–32)
AST: 18 IU/L (ref 0–40)
Albumin: 3.6 g/dL (ref 3.5–4.7)
BUN/Creatinine Ratio: 20 (ref 11–26)
BUN: 17 mg/dL (ref 8–27)
CALCIUM: 9.6 mg/dL (ref 8.7–10.3)
CO2: 30 mmol/L — ABNORMAL HIGH (ref 18–29)
CREATININE: 0.84 mg/dL (ref 0.57–1.00)
Chloride: 98 mmol/L (ref 97–108)
GFR, EST AFRICAN AMERICAN: 76 mL/min/{1.73_m2} (ref >59)
GFR, EST NON AFRICAN AMERICAN: 66 mL/min/{1.73_m2} (ref >59)
GLOBULIN, TOTAL: 4.9 g/dL — AB (ref 1.5–4.5)
Glucose: 82 mg/dL (ref 65–99)
Potassium: 3.9 mmol/L (ref 3.5–5.2)
Sodium: 139 mmol/L (ref 134–144)
Total Bilirubin: 0.3 mg/dL (ref 0.0–1.2)
Total Protein: 8.5 g/dL (ref 6.0–8.5)

## 2014-03-28 ENCOUNTER — Ambulatory Visit (INDEPENDENT_AMBULATORY_CARE_PROVIDER_SITE_OTHER): Payer: 59 | Admitting: Internal Medicine

## 2014-03-28 ENCOUNTER — Encounter: Payer: Self-pay | Admitting: Internal Medicine

## 2014-03-28 ENCOUNTER — Other Ambulatory Visit: Payer: Self-pay | Admitting: *Deleted

## 2014-03-28 VITALS — BP 140/82 | HR 60 | Temp 97.5°F | Resp 10 | Ht 67.5 in | Wt 148.8 lb

## 2014-03-28 DIAGNOSIS — D509 Iron deficiency anemia, unspecified: Secondary | ICD-10-CM

## 2014-03-28 DIAGNOSIS — E785 Hyperlipidemia, unspecified: Secondary | ICD-10-CM

## 2014-03-28 DIAGNOSIS — M810 Age-related osteoporosis without current pathological fracture: Secondary | ICD-10-CM

## 2014-03-28 DIAGNOSIS — I10 Essential (primary) hypertension: Secondary | ICD-10-CM

## 2014-03-28 MED ORDER — ALENDRONATE SODIUM 10 MG PO TABS: ORAL_TABLET | ORAL | Status: DC

## 2014-03-28 MED ORDER — ALENDRONATE SODIUM 10 MG PO TABS
10.0000 mg | ORAL_TABLET | Freq: Every day | ORAL | Status: DC
Start: 2014-03-28 — End: 2014-03-28

## 2014-03-28 MED ORDER — FERROUS SULFATE 325 (65 FE) MG PO TABS: 325.0000 mg | ORAL_TABLET | Freq: Every day | ORAL | Status: DC

## 2014-03-28 MED ORDER — SIMVASTATIN 10 MG PO TABS: 10.0000 mg | ORAL_TABLET | Freq: Every day | ORAL | Status: DC

## 2014-03-28 NOTE — Telephone Encounter (Signed)
[facility] faxed a note wanting clarification on medication. Rx says to take one tablet by mouth daily before breakfast on empty stomach with a full glass of water. Rx corrected to Once Weekly and faxed back.

## 2014-03-28 NOTE — Progress Notes (Signed)
Patient ID: Jasmine Fletcher, female   DOB: 1931-11-29, 80 y.o.   MRN: [id number]    Chief Complaint  Patient presents with  . Medical Management of Chronic Issues    4 month follow-up, discuss labs (copy printed)    No Known Allergies  HPI Patient is here for routine follow up visit.  Has been getting tired preparing meals. She lives alone and finds it tedious preparing meal for 1 person. Appetite is fair. Weight has been stable She denies any other concerns She lives by herself, has her daughter in [place] and son in [place]. She is independent with her activities of daily living. She does not drive (has never driven) Compliant with her medications  Review of Systems  Constitutional: Negative for fever, chills, weight loss, malaise/fatigue and diaphoresis.       Walking for her exercise and is careful with her dietary intake. She also does yoga and does gardening  HENT: Negative for congestion, ear discharge, ear pain and sore throat.   Eyes: Negative for blurred vision, double vision and discharge.       Wears corrective glasses, has eye appointment in June 2015  Respiratory: Negative for cough, hemoptysis, sputum production, shortness of breath and wheezing.   Cardiovascular: Positive for leg swelling. Negative for chest pain, palpitations, orthopnea and claudication.       Chronic ankle swelling towards the end of the day  Gastrointestinal: Negative for heartburn, nausea, vomiting, abdominal pain, constipation, blood in stool and melena.  Genitourinary: Negative for dysuria, urgency, frequency and flank pain.  Musculoskeletal: Negative for back pain, falls, joint pain, myalgias and neck pain.  Skin: Negative for itching and rash.  Neurological: Negative for dizziness, focal weakness, loss of consciousness, weakness and headaches.  Psychiatric/Behavioral: Negative for depression, suicidal ideas, hallucinations, memory loss and substance abuse. The patient is not nervous/anxious and  does not have insomnia.    Past Medical History  Diagnosis Date  . Hypertension   . Osteoporosis   . Hyperlipidemia    Past Surgical History  Procedure Laterality Date  . Abdominal hysterectomy  1998   Current Outpatient Prescriptions on File Prior to Visit  Medication Sig Dispense Refill  . hydrochlorothiazide (HYDRODIURIL) 25 MG tablet take 1 tablet by mouth once daily for blood pressure  30 tablet  3   No current facility-administered medications on file prior to visit.   Physical exam BP 140/82  Pulse 60  Temp(Src) 97.5 F (36.4 C) (Oral)  Resp 10  Ht 5' 7.5" (1.715 m)  Wt 148 lb 12.8 oz (67.495 kg)  BMI 22.95 kg/m2  SpO2 99%  General- elderly female in no acute distress Head- atraumatic, normocephalic Eyes- PERRLA, EOMI, no pallor, no icterus, no discharge Neck- no lymphadenopathy, no thyromegaly, no jugular vein distension, no carotid bruit Ears- left ear normal tympanic membrane and normal external ear canal , right ear normal tympanic membrane and normal external ear canal Cardiovascular- normal s1,s2, no murmurs/ rubs/ gallops Respiratory- bilateral clear to auscultation, no wheeze, no rhonchi, no crackles Abdomen- bowel sounds present, soft, non tender Musculoskeletal- able to move all 4 extremities Neurological- no focal deficit Psychiatry- alert and oriented to person, place and time, normal mood and affect   Labs- CBC    Component Value Date/Time   WBC 4.3 03/24/2014 0916   WBC 4.0 01/29/2014 1024   WBC 3.9 05/08/2009 1413   RBC 4.13 03/24/2014 0916   RBC 4.07 01/29/2014 1024   RBC 4.23 05/08/2009 1413   HGB  10.9* 03/24/2014 0916   HGB 11.8 05/08/2009 1413   HCT 33.2* 03/24/2014 0916   HCT 35.6 05/08/2009 1413   PLT 200 01/29/2014 1024   PLT 223 05/08/2009 1413   MCV 80 03/24/2014 0916   MCV 84.3 05/08/2009 1413   MCH 26.4* 03/24/2014 0916   MCH 26.0 01/29/2014 1024   MCH 27.8 05/08/2009 1413   MCHC 32.8 03/24/2014 0916   MCHC 32.2 01/29/2014 1024   MCHC 33.0  05/08/2009 1413   RDW 15.2 03/24/2014 0916   RDW 14.0 01/29/2014 1024   RDW 13.5 05/08/2009 1413   LYMPHSABS 1.2 03/24/2014 0916   LYMPHSABS 1.4 01/29/2014 1024   LYMPHSABS 1.3 05/08/2009 1413   MONOABS 0.6 01/29/2014 1024   MONOABS 0.5 05/08/2009 1413   EOSABS 0.0 03/24/2014 0916   EOSABS 0.0 01/29/2014 1024   BASOSABS 0.0 03/24/2014 0916   BASOSABS 0.0 01/29/2014 1024   BASOSABS 0.0 05/08/2009 1413    CMP     Component Value Date/Time   NA 139 03/24/2014 0916   NA 135* 01/29/2014 1024   K 3.9 03/24/2014 0916   CL 98 03/24/2014 0916   CO2 30* 03/24/2014 0916   GLUCOSE 82 03/24/2014 0916   GLUCOSE 99 01/29/2014 1024   BUN 17 03/24/2014 0916   BUN 25* 01/29/2014 1024   CREATININE 0.84 03/24/2014 0916   CALCIUM 9.6 03/24/2014 0916   PROT 8.5 03/24/2014 0916   PROT 9.5* 01/29/2014 1024   ALBUMIN 3.2* 01/29/2014 1024   AST 18 03/24/2014 0916   ALT 9 03/24/2014 0916   ALKPHOS 67 03/24/2014 0916   BILITOT 0.3 03/24/2014 0916   GFRNONAA 66 03/24/2014 0916   GFRAA 76 03/24/2014 0916   Lipid Panel     Component Value Date/Time   CHOL 182 12/27/2010 2025   TRIG 127 03/24/2014 0916   HDL 46 03/24/2014 0916   HDL 45 12/27/2010 2025   CHOLHDL 3.2 03/24/2014 0916   CHOLHDL 4.0 Ratio 12/27/2010 2025   VLDL 17 12/27/2010 2025   LDLCALC 74 03/24/2014 0916   LDLCALC 120* 12/27/2010 2025   Assessment/plan  1. Osteoporosis, unspecified Continue alendronate, check dexa scan and consider stopping alendronate given her being on it for > 4 years. Continue ca0vit d - DG Bone Density; Future  2. Other and unspecified hyperlipidemia Reviewed lipid panel. Will reduce zocor to 10 mg daily for now and monitor  3. HYPERTENSION, BENIGN ESSENTIAL  stable bp readings. Continue HCTZ 25 mg daily. Continue her daily activities  4. ANEMIA, HYPOCHROMIC Start ferrous sulfate 325 mg daily for now. Monitor cbc periodically

## 2014-03-28 NOTE — Patient Instructions (Signed)
You can cut your simvastatin into half for a total of 10 mg until you run out of your current supply and then pick the new script of 10 mg daily

## 2014-04-21 ENCOUNTER — Ambulatory Visit (HOSPITAL_COMMUNITY)
Admission: RE | Admit: 2014-04-21 | Discharge: 2014-04-21 | Disposition: A | Discharge status: Home / Self Care | Payer: PRIVATE HEALTH INSURANCE | Source: Ambulatory Visit | Attending: Internal Medicine | Admitting: Internal Medicine

## 2014-04-21 DIAGNOSIS — M810 Age-related osteoporosis without current pathological fracture: Secondary | ICD-10-CM

## 2014-04-21 DIAGNOSIS — Z780 Asymptomatic menopausal state: Secondary | ICD-10-CM | POA: Insufficient documentation

## 2014-04-21 DIAGNOSIS — Z13820 Encounter for screening for osteoporosis: Secondary | ICD-10-CM | POA: Insufficient documentation

## 2014-05-16 ENCOUNTER — Other Ambulatory Visit: Payer: Self-pay | Admitting: Internal Medicine

## 2014-05-23 ENCOUNTER — Ambulatory Visit (INDEPENDENT_AMBULATORY_CARE_PROVIDER_SITE_OTHER): Payer: 59 | Admitting: Internal Medicine

## 2014-05-23 ENCOUNTER — Encounter: Payer: Self-pay | Admitting: Internal Medicine

## 2014-05-23 VITALS — BP 158/76 | HR 58 | Temp 97.6°F | Resp 18 | Ht 67.5 in | Wt 149.8 lb

## 2014-05-23 DIAGNOSIS — M810 Age-related osteoporosis without current pathological fracture: Secondary | ICD-10-CM

## 2014-05-23 NOTE — Progress Notes (Signed)
Patient ID: Jasmine Fletcher, female   DOB: 1930/12/29, 80 y.o.   MRN: [id number]    Chief Complaint  Patient presents with  . Follow-up   No Known Allergies  HPI 80 y.o female patient is here for follow up on her dexa scan. She has history of osteoporosis. No falls reported. Taking alendronate at present.  ROS Denies bone pain Denies chest pain or dyspnea  Past Medical History  Diagnosis Date  . Hypertension   . Osteoporosis   . Hyperlipidemia    Medication reviewed. See [facility]  Physical exam BP 158/76  Pulse 58  Temp(Src) 97.6 F (36.4 C) (Oral)  Resp 18  Ht 5' 7.5" (1.715 m)  Wt 149 lb 12.8 oz (67.949 kg)  BMI 23.10 kg/m2  SpO2 96%  gen- elderly female thin built in no acute distress HEENT- no pallor, no icterus, MMM cvs- normal s1,s2, rrr respi-CTAB Extremities- able to move all 4, no leg edema  Assessment/plan  1. Osteoporosis, unspecified Reviewed dexa scan. 04/21/14 t score is -3.4 with t score -3.4 in 2012 and -2.7 in 2010. Has been on alendronate for > 4 years. Will stop alendronate with her BMD unchanged. Will have her on prolia injection every 6 months instead to help with her bone density to help increase bone mass by decreasing bone resorption. Reviewed her renal function- last in 4/15 normal. Will get form filled out and once approved by insurance, will need to call patient with appointment

## 2014-05-25 ENCOUNTER — Other Ambulatory Visit: Payer: Self-pay | Admitting: Internal Medicine

## 2014-06-14 ENCOUNTER — Other Ambulatory Visit: Payer: Self-pay | Admitting: Internal Medicine

## 2014-07-12 ENCOUNTER — Encounter: Payer: Self-pay | Admitting: Internal Medicine

## 2014-07-25 ENCOUNTER — Encounter: Payer: Self-pay | Admitting: Internal Medicine

## 2014-07-25 ENCOUNTER — Ambulatory Visit (INDEPENDENT_AMBULATORY_CARE_PROVIDER_SITE_OTHER): Payer: 59 | Admitting: Internal Medicine

## 2014-07-25 VITALS — BP 122/70 | HR 54 | Temp 97.8°F | Ht 67.5 in | Wt 152.0 lb

## 2014-07-25 DIAGNOSIS — I498 Other specified cardiac arrhythmias: Secondary | ICD-10-CM

## 2014-07-25 DIAGNOSIS — I10 Essential (primary) hypertension: Secondary | ICD-10-CM

## 2014-07-25 DIAGNOSIS — M810 Age-related osteoporosis without current pathological fracture: Secondary | ICD-10-CM

## 2014-07-25 DIAGNOSIS — Z23 Encounter for immunization: Secondary | ICD-10-CM

## 2014-07-25 DIAGNOSIS — H6120 Impacted cerumen, unspecified ear: Secondary | ICD-10-CM

## 2014-07-25 DIAGNOSIS — Z1211 Encounter for screening for malignant neoplasm of colon: Secondary | ICD-10-CM

## 2014-07-25 DIAGNOSIS — G3184 Mild cognitive impairment, so stated: Secondary | ICD-10-CM | POA: Insufficient documentation

## 2014-07-25 DIAGNOSIS — Z0000 Encounter for general adult medical examination without abnormal findings: Secondary | ICD-10-CM

## 2014-07-25 DIAGNOSIS — D509 Iron deficiency anemia, unspecified: Secondary | ICD-10-CM

## 2014-07-25 DIAGNOSIS — H6123 Impacted cerumen, bilateral: Secondary | ICD-10-CM

## 2014-07-25 MED ORDER — CALCIUM CARBONATE-VITAMIN D 500-200 MG-UNIT PO TABS: 1.0000 | ORAL_TABLET | Freq: Two times a day (BID) | ORAL | Status: DC

## 2014-07-25 MED ORDER — CARBAMIDE PEROXIDE 6.5 % OT SOLN: 5.0000 [drp] | Freq: Two times a day (BID) | OTIC | Status: DC

## 2014-07-25 MED ORDER — FERROUS SULFATE 325 (65 FE) MG PO TABS: 325.0000 mg | ORAL_TABLET | Freq: Every day | ORAL | Status: DC

## 2014-07-25 MED ORDER — HYDROCHLOROTHIAZIDE 25 MG PO TABS: ORAL_TABLET | ORAL | Status: DC

## 2014-07-25 NOTE — Progress Notes (Signed)
Patient ID: Jasmine Fletcher, female   DOB: 02-15-1931, 80 y.o.   MRN: [id number]      No Known Allergies  Chief Complaint: annual exam  HPI:  80 y/o female patient is here for her annual exam. She has history of HTN, chronic bradycardia, GERD, osteoporosis, anemia and hyperlipidemia She denies any complaints this visit. Walks four times a week with her group of friends for exercise. Due for prevnar vaccine. uptodate with other immunizations. Does not want shingles vaccine Colonoscopy in past normal as per pt Does not want further mammogram and pelvic exam Her reflux symptom has completely resolved Normal kidney function on review of labs Denies any pain Independent with her ADLs. Has stopped driving. No use of assistive device   Wt Readings from Last 3 Encounters:  07/25/14 152 lb (68.947 kg)  05/23/14 149 lb 12.8 oz (67.949 kg)  03/28/14 148 lb 12.8 oz (67.495 kg)   Review of Systems:  Constitutional: Negative for fever, chills, weight loss, malaise/fatigue and diaphoresis.  HENT: Negative for congestion, hearing loss and sore throat.   Eyes: Negative for eye pain, blurred vision, double vision and discharge. eye doctor appointment next month Respiratory: Negative for cough, sputum production, shortness of breath and wheezing.   Cardiovascular: Negative for chest pain, palpitations, orthopnea. Has chronic ankle and foot swelling which improves with rest and leg elevation  Gastrointestinal: Negative for heartburn, nausea, vomiting, abdominal pain, diarrhea and constipation.  Genitourinary: Negative for dysuria, urgency, frequency, hematuria and flank pain.  Musculoskeletal: Negative for back pain, falls, joint pain and myalgias.  Skin: Negative for itching and rash.  Neurological: Negative for weakness,dizziness, tingling, focal weakness and headaches.  Psychiatric/Behavioral:  The patient is not nervous/anxious.  mood has been ok overall, felt low last week with her sister being  diagnosed with cancer. Has mild memory loss   Past Medical History  Diagnosis Date  . Hypertension   . Osteoporosis   . Hyperlipidemia    Past Surgical History  Procedure Laterality Date  . Abdominal hysterectomy  1998   Social History:   reports that she has quit smoking. She does not have any smokeless tobacco history on file. She reports that she does not drink alcohol. Her drug history is not on file.  Family History  Problem Relation Age of Onset  . Cancer Father     Medications: Patient's Medications  New Prescriptions   No medications on file  Previous Medications   ALENDRONATE (FOSAMAX) 10 MG TABLET    take 1 tablet by mouth once daily ON A EMPTY STOMACH WITH A FULL GLASS OF WATER   FERROUS SULFATE 325 (65 FE) MG TABLET    Take 1 tablet (325 mg total) by mouth daily with breakfast.   HYDROCHLOROTHIAZIDE (HYDRODIURIL) 25 MG TABLET    take 1 tablet by mouth once daily for blood pressure   SIMVASTATIN (ZOCOR) 10 MG TABLET    Take 1 tablet (10 mg total) by mouth daily.  Modified Medications   No medications on file  Discontinued Medications   No medications on file     Physical Exam: Filed Vitals:   07/25/14 0950  BP: 122/70  Pulse: 54  Temp: 97.8 F (36.6 C)  TempSrc: Oral  Height: 5' 7.5" (1.715 m)  Weight: 152 lb (68.947 kg)  SpO2: 98%    General- elderly female in no acute distress Head- atraumatic, normocephalic Eyes- PERRLA, EOMI, no pallor, no icterus, no discharge Neck- no lymphadenopathy, no thyromegaly, no jugular vein distension,  no carotid bruit Ears- impacted cerumen in both ears Throat- moist mucus membrane, normal oropharynx, upper dentures, poor lower dentition Nose- normal nasal mucosa, no maxillary or frontal sinus tenderness Chest- no chest wall deformities, no chest wall tenderness Breast- no masses, no palpable lumps, normal nipple and areola exam, no axillary lymphadenopathy Cardiovascular- normal s1,s2, no murmurs/ rubs/  gallops Respiratory- bilateral clear to auscultation, no wheeze, no rhonchi, no crackles, no use of accessory muscles Abdomen- bowel sounds present, soft, non tender, no organomegaly, no abdominal bruits, no guarding or rigidity, no CVA tenderness Pelvic exam- refused Musculoskeletal- able to move all 4 extremities, no spinal and paraspinal tenderness, steady gait, no use of assistive device, normal range of motion, foot and ankle edema 1+ with trace leg edema  Neurological- no focal deficit, normal reflexes, normal muscle strength, normal sensation to fine touch and vibration Skin- warm and dry, several moles in her back and chest, pt mentions unchanged from before Psychiatry- alert and oriented to person, place and time, normal mood and affect   Labs reviewed: Basic Metabolic Panel:  Recent Labs  01/29/14 1024 02/01/14 0954 03/24/14 0916  NA 135* 133* 139  K 3.5* 3.8 3.9  CL 96 92* 98  CO2 26 25 30*  GLUCOSE 99 97 82  BUN 25* 16 17  CREATININE 0.93 0.93 0.84  CALCIUM 9.1 8.8 9.6   Liver Function Tests:  Recent Labs  01/29/14 1024 03/24/14 0916  AST 17 18  ALT 10 9  ALKPHOS 69 67  BILITOT 0.2* 0.3  PROT 9.5* 8.5  ALBUMIN 3.2*  --    No results found for this basename: LIPASE, AMYLASE,  in the last 8760 hours No results found for this basename: AMMONIA,  in the last 8760 hours CBC:  Recent Labs  01/29/14 1024 03/24/14 0916  WBC 4.0 4.3  NEUTROABS 2.1 2.3  HGB 10.6* 10.9*  HCT 32.9* 33.2*  MCV 80.8 80  PLT 200  --    Lipid Panel     Component Value Date/Time   CHOL 182 12/27/2010 2025   TRIG 127 03/24/2014 0916   HDL 46 03/24/2014 0916   HDL 45 12/27/2010 2025   CHOLHDL 3.2 03/24/2014 0916   CHOLHDL 4.0 Ratio 12/27/2010 2025   VLDL 17 12/27/2010 2025   LDLCALC 74 03/24/2014 0916   LDLCALC 120* 12/27/2010 2025   Lab Results  Component Value Date   TSH 2.588 12/27/2010   07/11/14 vit d 50.8  EKG: from 2014 reviewed with sinus bradycardia  07/25/14 mmse  27/30 and failed clock draw  Assessment/Plan  1. HYPERTENSION, BENIGN ESSENTIAL bp stable. Continue hctz current dose, leg elevation at rest and use ted hose for now  2. BRADYCARDIA Sinus and asymptomatic, monitor clinically  3. Osteoporosis, unspecified Stop fosamax. prolia paperwork has been faxed. dexa t score -3.4. Will have her take ca-vit d supplement  4. ANEMIA, HYPOCHROMIC Stable. Normal hb/hct and mcv. Continue ferrous sulfate 325 mg daily  5. Routine general medical examination at a health care facility the patient was counseled regarding the appropriate use of alcohol, regular self-examination of the breasts on a monthly basis, prevention of dental and periodontal disease, diet, regular sustained exercise for at least 30 minutes 5 times per week, routine screening interval for mammogram as recommended by the [facility] and ACOG, the proper use of sunscreen and protective clothing, tobacco use, and recommended schedule for GI hemoccult testing, colonoscopy, cholesterol, thyroid and diabetes screening. FOBT card is provided. Normal breast exam.  Advance care planning done- pt has HCPOA and living will, will bring copies to office next OV  6. Mild cognitive impairment Pt is alert and oriented. Her vascular problems likely contributing some. Monitor clinically for now. Patient does not want any medication. Continue statin. Patient will be started on baby aspirin once a day  7. Impacted cerumen, bilateral Debrox ear drop for both ears, if persists, consider ent referral for cerumen removal  8. Need for prophylactic vaccination and inoculation against viral hepatitis - Pneumococcal conjugate vaccine 13-valent  9. Special screening for malignant neoplasms, colon - Fecal occult blood, imunochemical; Future   Blanchie Serve, MD  [facility] [phone] (Monday-Friday 8 am - 5 pm) [phone] (afterhours)

## 2014-07-25 NOTE — Progress Notes (Signed)
Passed clock drawing 

## 2014-07-28 ENCOUNTER — Encounter: Payer: Self-pay | Admitting: *Deleted

## 2014-07-31 ENCOUNTER — Other Ambulatory Visit: Payer: Self-pay | Admitting: Internal Medicine

## 2014-08-04 ENCOUNTER — Ambulatory Visit (INDEPENDENT_AMBULATORY_CARE_PROVIDER_SITE_OTHER): Payer: 59 | Admitting: Internal Medicine

## 2014-08-04 ENCOUNTER — Ambulatory Visit: Payer: Self-pay

## 2014-08-04 VITALS — BP 130/74 | HR 55

## 2014-08-04 DIAGNOSIS — M810 Age-related osteoporosis without current pathological fracture: Secondary | ICD-10-CM

## 2014-08-04 MED ORDER — DENOSUMAB 60 MG/ML ~~LOC~~ SOLN
60.0000 mg | Freq: Once | SUBCUTANEOUS | Status: AC
  Administered 2014-08-04: 60 mg via SUBCUTANEOUS

## 2014-08-04 NOTE — Progress Notes (Signed)
Patient ID: Jasmine Fletcher, female   DOB: 12-29-1930, 80 y.o.   MRN: [id number] prolia shot given by MA

## 2014-12-09 ENCOUNTER — Other Ambulatory Visit: Payer: Self-pay | Admitting: Internal Medicine

## 2015-01-30 ENCOUNTER — Ambulatory Visit (INDEPENDENT_AMBULATORY_CARE_PROVIDER_SITE_OTHER): Payer: Medicare Other | Admitting: Internal Medicine

## 2015-01-30 ENCOUNTER — Encounter: Payer: Self-pay | Admitting: Internal Medicine

## 2015-01-30 VITALS — BP 152/80 | HR 69 | Temp 97.7°F | Resp 18 | Ht 67.2 in | Wt 159.2 lb

## 2015-01-30 DIAGNOSIS — D509 Iron deficiency anemia, unspecified: Secondary | ICD-10-CM

## 2015-01-30 DIAGNOSIS — I10 Essential (primary) hypertension: Secondary | ICD-10-CM | POA: Diagnosis not present

## 2015-01-30 DIAGNOSIS — M810 Age-related osteoporosis without current pathological fracture: Secondary | ICD-10-CM | POA: Diagnosis not present

## 2015-01-30 DIAGNOSIS — E785 Hyperlipidemia, unspecified: Secondary | ICD-10-CM | POA: Diagnosis not present

## 2015-01-30 DIAGNOSIS — Z23 Encounter for immunization: Secondary | ICD-10-CM

## 2015-01-30 MED ORDER — LISINOPRIL 5 MG PO TABS: 5.0000 mg | ORAL_TABLET | Freq: Every day | ORAL | Status: DC

## 2015-01-30 MED ORDER — ALENDRONATE SODIUM 70 MG PO TABS: 70.0000 mg | ORAL_TABLET | ORAL | Status: DC

## 2015-01-30 NOTE — Patient Instructions (Signed)
Check blood pressure twice a week at the centre and fax/ drop a copy of your bp reading over the next 3-4 weeks. i would review that and adjust bp medication further if needed

## 2015-01-30 NOTE — Progress Notes (Signed)
Patient ID: Jasmine Fletcher, female   DOB: [birth year], 81 y.o.   MRN: [id number]    Chief Complaint  Patient presents with  . Medical Management of Chronic Issues   No Known Allergies  HPI 81 y/o female patient is here for her routine visit.  She has history of HTN, osteoporosis, anemia and hyperlipidemia She has been going to adult enrichment centre and has joined cooking classes. Her bp has been running high at home- 140-160/ 80-90 She walks for exercise and tries to eat healthy She denies any complaints this visit. Independent with her ADLs. Has stopped driving. No use of assistive device Has somewhat stressful situation at home with her sisters having health issues    Review of Systems Constitutional: Negative for fever, chills, malaise/fatigue and diaphoresis.  HENT: Negative for congestion, hearing loss and sore throat.   Eyes: Negative for eye pain, blurred vision, double vision and discharge. Wears eye glasses Respiratory: Negative for cough, sputum production, shortness of breath and wheezing.   Cardiovascular: Negative for chest pain, palpitations, orthopnea. Has increased ankle and foot swelling which somewhat improves with rest and leg elevation  Gastrointestinal: Negative for heartburn, nausea, vomiting, abdominal pain, diarrhea and constipation.  Genitourinary: Negative for dysuria, urgency, frequency, hematuria and flank pain.  Musculoskeletal: Negative for back pain, falls, joint pain and myalgias.  Skin: Negative for itching and rash.  Neurological: Negative for weakness,dizziness, tingling, focal weakness and headaches.  Psychiatric/Behavioral:  The patient is not nervous/anxious.  mood has been ok overall   Past Medical History  Diagnosis Date  . Hypertension   . Osteoporosis   . Hyperlipidemia    Current Outpatient Prescriptions on File Prior to Visit  Medication Sig Dispense Refill  . aspirin 81 MG tablet Take 81 mg by mouth daily.    . calcium-vitamin D  (OSCAL 500/200 D-3) 500-200 MG-UNIT per tablet Take 1 tablet by mouth 2 (two) times daily. 60 tablet 3  . ferrous sulfate 325 (65 FE) MG tablet Take 1 tablet (325 mg total) by mouth daily with breakfast. 30 tablet 3  . hydrochlorothiazide (HYDRODIURIL) 25 MG tablet take 1 tablet by mouth once daily for blood pressure 30 tablet 3  . simvastatin (ZOCOR) 10 MG tablet Take 1 tablet (10 mg total) by mouth daily. 90 tablet 3  . carbamide peroxide (DEBROX) 6.5 % otic solution Place 5 drops into both ears 2 (two) times daily. (Patient not taking: Reported on 01/30/2015) 15 mL 0   No current facility-administered medications on file prior to visit.   Physical exam BP 152/80 mmHg  Pulse 69  Temp(Src) 97.7 F (36.5 C) (Oral)  Resp 18  Ht 5' 7.2" (1.707 m)  Wt 159 lb 3.2 oz (72.213 kg)  BMI 24.78 kg/m2  SpO2 95%  General- elderly female in no acute distress Head- atraumatic, normocephalic Eyes- PERRLA, EOMI, no pallor, no icterus, no discharge Neck- no lymphadenopathy, no thyromegaly Throat- moist mucus membrane Cardiovascular- normal s1,s2, no murmurs, 1+ pitting ankle edema Respiratory- bilateral clear to auscultation, no wheeze, no rhonchi, no crackles, no use of accessory muscles Abdomen- bowel sounds present, soft, non tender Musculoskeletal- able to move all 4 extremities, no spinal and paraspinal tenderness, steady gait, no use of assistive device, normal range of motion   Neurological- no focal deficit Skin- warm and dry Psychiatry- alert and oriented to person, place and time, normal mood and affect  Wt Readings from Last 3 Encounters:  01/30/15 159 lb 3.2 oz (72.213 kg)  07/25/14 152  lb (68.947 kg)  05/23/14 149 lb 12.8 oz (67.949 kg)   Labs  CMP Latest Ref Rng 03/24/2014 02/01/2014 01/29/2014  Glucose 65 - 99 mg/dL 82 97 99  BUN 8 - 27 mg/dL 17 16 25(H)  Creatinine 0.57 - 1.00 mg/dL 0.84 0.93 0.93  Sodium 134 - 144 mmol/L 139 133(L) 135(L)  Potassium 3.5 - 5.2 mmol/L 3.9 3.8  3.5(L)  Chloride 97 - 108 mmol/L 98 92(L) 96  CO2 18 - 29 mmol/L 30(H) 25 26  Calcium 8.7 - 10.3 mg/dL 9.6 8.8 9.1  Total Protein 6.0 - 8.5 g/dL 8.5 - 9.5(H)  Albumin 3.5 - 4.7 g/dL 3.6 - -  Total Bilirubin 0.0 - 1.2 mg/dL 0.3 - 0.2(L)  Alkaline Phos 39 - 117 IU/L 67 - 69  AST 0 - 40 IU/L 18 - 17  ALT 0 - 32 IU/L 9 - 10   CBC Latest Ref Rng 03/24/2014 01/29/2014 07/14/2013  WBC 3.4 - 10.8 x10E3/uL 4.3 4.0 5.4  Hemoglobin 11.1 - 15.9 g/dL 10.9(L) 10.6(L) 10.9(L)  Hematocrit 34.0 - 46.6 % 33.2(L) 32.9(L) 34.2  Platelets 150 - 400 K/uL - 200 -   Lipid Panel     Component Value Date/Time   CHOL 182 12/27/2010 2025   TRIG 127 03/24/2014 0916   HDL 46 03/24/2014 0916   HDL 45 12/27/2010 2025   CHOLHDL 3.2 03/24/2014 0916   CHOLHDL 4.0 Ratio 12/27/2010 2025   VLDL 17 12/27/2010 2025   LDLCALC 74 03/24/2014 0916   LDLCALC 120* 12/27/2010 2025    Assessment/plan  1. Uncontrolled hypertension Elevated bp reading in office and at home. Add lisinopril 5 mg daily, continue hctz 25 mg daily, monitor bp 2/week and send copy of readings in a month for review to adjust med if needed. Has gained several pounds since last visit, advised on weight loss, diet and exercise and reduction of salt intake. Warning signs with elevated bp explained - CMP - CMP; Future - CMP  2. Anemia, iron deficiency Has not been taking ferrous sulfate at present. Resume this. Also check iron store to assess for need to increase the dosing - CBC with Differential - Ferritin - Iron and TIBC - CBC with Differential; Future - CBC with Differential  3. Osteoporosis Unable to take prolia further with cost issue. Will have her on weekly regimen of fosamax for now and see if pt is eligible for assistance program to help with the cost as pt has been on fosamax before and continued to have bone loss. Weight bearing exercise encouraged. Pt has stopped taking ca-vit d, advised to resume it. Pt agrees  4.  Hyperlipidemia Continue zocor 10 mg daily, check flp prior to next visit - CMP; Future - Lipid Panel; Future - CMP - Lipid Panel  5. Encounter for immunization Flu vaccine provided

## 2015-01-31 LAB — CBC WITH DIFFERENTIAL/PLATELET
BASOS: 1 %
Basophils Absolute: 0.1 10*3/uL (ref 0.0–0.2)
EOS ABS: 0 10*3/uL (ref 0.0–0.4)
EOS: 1 %
HEMATOCRIT: 33.6 % — AB (ref 34.0–46.6)
Hemoglobin: 10.9 g/dL — ABNORMAL LOW (ref 11.1–15.9)
IMMATURE GRANULOCYTES: 0 %
Immature Grans (Abs): 0 10*3/uL (ref 0.0–0.1)
LYMPHS: 30 %
Lymphocytes Absolute: 1.3 10*3/uL (ref 0.7–3.1)
MCH: 26.7 pg (ref 26.6–33.0)
MCHC: 32.4 g/dL (ref 31.5–35.7)
MCV: 82 fL (ref 79–97)
MONOCYTES: 14 %
Monocytes Absolute: 0.6 10*3/uL (ref 0.1–0.9)
NEUTROS ABS: 2.4 10*3/uL (ref 1.4–7.0)
NEUTROS PCT: 54 %
Platelets: 238 10*3/uL (ref 150–379)
RBC: 4.09 x10E6/uL (ref 3.77–5.28)
RDW: 15.1 % (ref 12.3–15.4)
WBC: 4.3 10*3/uL (ref 3.4–10.8)

## 2015-01-31 LAB — COMPREHENSIVE METABOLIC PANEL
ALT: 7 IU/L (ref 0–32)
AST: 13 IU/L (ref 0–40)
Albumin/Globulin Ratio: 0.7 — ABNORMAL LOW (ref 1.1–2.5)
Albumin: 3.6 g/dL (ref 3.5–4.7)
Alkaline Phosphatase: 60 IU/L (ref 39–117)
BILIRUBIN TOTAL: 0.3 mg/dL (ref 0.0–1.2)
BUN/Creatinine Ratio: 26 (ref 11–26)
BUN: 24 mg/dL (ref 8–27)
CALCIUM: 9.3 mg/dL (ref 8.7–10.3)
CHLORIDE: 102 mmol/L (ref 97–108)
CO2: 25 mmol/L (ref 18–29)
Creatinine, Ser: 0.91 mg/dL (ref 0.57–1.00)
GFR, EST AFRICAN AMERICAN: 68 mL/min/{1.73_m2} (ref >59)
GFR, EST NON AFRICAN AMERICAN: 59 mL/min/{1.73_m2} — AB (ref >59)
Globulin, Total: 5.3 g/dL — ABNORMAL HIGH (ref 1.5–4.5)
Glucose: 85 mg/dL (ref 65–99)
Potassium: 4.2 mmol/L (ref 3.5–5.2)
Sodium: 140 mmol/L (ref 134–144)
TOTAL PROTEIN: 8.9 g/dL — AB (ref 6.0–8.5)

## 2015-01-31 LAB — LIPID PANEL
Chol/HDL Ratio: 3.3 ratio units (ref 0.0–4.4)
Cholesterol, Total: 160 mg/dL (ref 100–199)
HDL: 48 mg/dL (ref >39)
LDL Calculated: 88 mg/dL (ref 0–99)
TRIGLYCERIDES: 119 mg/dL (ref 0–149)
VLDL CHOLESTEROL CAL: 24 mg/dL (ref 5–40)

## 2015-01-31 LAB — IRON AND TIBC
IRON: 45 ug/dL (ref 27–139)
Iron Saturation: 18 % (ref 15–55)
Total Iron Binding Capacity: 245 ug/dL — ABNORMAL LOW (ref 250–450)
UIBC: 200 ug/dL (ref 118–369)

## 2015-01-31 LAB — FERRITIN: Ferritin: 107 ng/mL (ref 15–150)

## 2015-02-02 ENCOUNTER — Telehealth: Payer: Self-pay

## 2015-02-02 NOTE — Telephone Encounter (Signed)
-----   Message from Blanchie Serve, MD sent at 02/02/2015  9:36 AM EST ----- You continue to have mild anemia and this likely is in setting of your chronic disease hypertension. Will monitor clinically. Normal cholesterol level. Your iron store is normal. Will stop your iron supplement

## 2015-02-02 NOTE — Telephone Encounter (Signed)
Spoke with patient, patient verbalized understanding of results. Removed Iron supplement from medication list. Copy of report mailed.  FYI- patient is unable to check blood pressure twice weekly as directed by Dr.Pandey. Patient said she will try her best to check as often as she can.

## 2015-03-28 ENCOUNTER — Other Ambulatory Visit: Payer: Self-pay | Admitting: Internal Medicine

## 2015-04-11 ENCOUNTER — Other Ambulatory Visit: Payer: Self-pay | Admitting: *Deleted

## 2015-04-11 MED ORDER — HYDROCHLOROTHIAZIDE 25 MG PO TABS: ORAL_TABLET | ORAL | Status: DC

## 2015-08-01 ENCOUNTER — Ambulatory Visit: Payer: Medicare Other | Admitting: Internal Medicine

## 2015-08-15 ENCOUNTER — Other Ambulatory Visit: Payer: Self-pay | Admitting: Internal Medicine

## 2015-08-17 ENCOUNTER — Encounter: Payer: Self-pay | Admitting: Internal Medicine

## 2015-08-17 ENCOUNTER — Ambulatory Visit (INDEPENDENT_AMBULATORY_CARE_PROVIDER_SITE_OTHER): Payer: Medicare Other | Admitting: Internal Medicine

## 2015-08-17 VITALS — BP 110/72 | HR 64 | Temp 97.8°F | Resp 20 | Ht 67.0 in | Wt 155.2 lb

## 2015-08-17 DIAGNOSIS — E785 Hyperlipidemia, unspecified: Secondary | ICD-10-CM

## 2015-08-17 DIAGNOSIS — I10 Essential (primary) hypertension: Secondary | ICD-10-CM | POA: Diagnosis not present

## 2015-08-17 DIAGNOSIS — M810 Age-related osteoporosis without current pathological fracture: Secondary | ICD-10-CM

## 2015-08-17 DIAGNOSIS — D509 Iron deficiency anemia, unspecified: Secondary | ICD-10-CM | POA: Diagnosis not present

## 2015-08-17 MED ORDER — DENOSUMAB 60 MG/ML ~~LOC~~ SOLN
60.0000 mg | Freq: Once | SUBCUTANEOUS | Status: AC
  Administered 2015-08-17: 60 mg via SUBCUTANEOUS

## 2015-08-17 NOTE — Progress Notes (Signed)
Patient ID: Jasmine Fletcher, female   DOB: Dec 12, 1930, 81 y.o.   MRN: [id number]    Location:    [facility]   Place of Service:  OFFICE   Chief Complaint  Patient presents with  . Medical Management of Chronic Issues    6 month follow-up and prolia injection    HPI:  81 yo female seen today for f/u. She reports feeling well overall. She goes to the [facility] daily and volunteers in various projects. She needs Prolia injection today. She has not had her flu shot yet.   Increased stressors due to loss of sister from cancer (metastatic liver/lung/renal) at 75.  HTN - stable on lisinopril. No CP/SOB/palpitations  Hyperlipidemia - stable on statin. She maintains low fat low cholesterol diet. No myalgias   Past Medical History  Diagnosis Date  . Hypertension   . Osteoporosis   . Hyperlipidemia     Past Surgical History  Procedure Laterality Date  . Abdominal hysterectomy  1998    Patient Care Team: Blanchie Serve, MD as PCP - General (Internal Medicine)  Social History   Social History  . Marital Status: Single    Spouse Name: N/A  . Number of Children: N/A  . Years of Education: N/A   Occupational History  . Not on file.   Social History Main Topics  . Smoking status: Former Smoker -- 1.00 packs/day  . Smokeless tobacco: Never Used  . Alcohol Use: No  . Drug Use: No  . Sexual Activity: Not on file   Other Topics Concern  . Not on file   Social History Narrative     reports that she has quit smoking. She has never used smokeless tobacco. She reports that she does not drink alcohol or use illicit drugs.  No Known Allergies  Medications: Patient's Medications  New Prescriptions   No medications on file  Previous Medications   ALENDRONATE (FOSAMAX) 70 MG TABLET    Take 1 tablet (70 mg total) by mouth every 7 (seven) days. Take with a full glass of water on an empty stomach.   ASPIRIN 81 MG TABLET    Take 81 mg by mouth daily.   CALCIUM-VITAMIN D (OSCAL  500/200 D-3) 500-200 MG-UNIT PER TABLET    Take 1 tablet by mouth 2 (two) times daily.   CARBAMIDE PEROXIDE (DEBROX) 6.5 % OTIC SOLUTION    Place 5 drops into both ears 2 (two) times daily.   HYDROCHLOROTHIAZIDE (HYDRODIURIL) 25 MG TABLET    take 1 tablet by mouth once daily   LISINOPRIL (PRINIVIL,ZESTRIL) 5 MG TABLET    Take 1 tablet (5 mg total) by mouth daily.   SIMVASTATIN (ZOCOR) 10 MG TABLET    take 1 tablet by mouth once daily  Modified Medications   No medications on file  Discontinued Medications   No medications on file    Review of Systems  Constitutional: Negative for fever, chills, diaphoresis, activity change, appetite change and fatigue.  HENT: Negative for ear pain and sore throat.   Eyes: Negative for visual disturbance.  Respiratory: Negative for cough, chest tightness and shortness of breath.   Cardiovascular: Negative for chest pain, palpitations and leg swelling.  Gastrointestinal: Negative for nausea, vomiting, abdominal pain, diarrhea, constipation and blood in stool.  Genitourinary: Negative for dysuria.  Musculoskeletal: Negative for arthralgias.  Neurological: Negative for dizziness, tremors, numbness and headaches.  Psychiatric/Behavioral: Negative for sleep disturbance. The patient is not nervous/anxious.     Filed Vitals:  08/17/15 1042  BP: 110/72  Pulse: 64  Temp: 97.8 F (36.6 C)  TempSrc: Oral  Resp: 20  Height: 5\' 7"  (1.702 m)  Weight: 155 lb 3.2 oz (70.398 kg)  SpO2: 98%   Body mass index is 24.3 kg/(m^2).  Physical Exam  Constitutional: She appears well-developed and well-nourished.  HENT:  Mouth/Throat: Oropharynx is clear and moist. No oropharyngeal exudate.  Eyes: Pupils are equal, round, and reactive to light. No scleral icterus.  Neck: Neck supple. Carotid bruit is not present. No tracheal deviation present. No thyromegaly present.  Cardiovascular: Normal rate, regular rhythm, normal heart sounds and intact distal pulses.  Exam  reveals no gallop and no friction rub.   No murmur heard. No LE edema b/l. no calf TTP.   Pulmonary/Chest: Effort normal and breath sounds normal. No stridor. No respiratory distress. She has no wheezes. She has no rales.  Abdominal: Soft. Bowel sounds are normal. She exhibits no distension and no mass. There is no hepatomegaly. There is no tenderness. There is no rebound and no guarding.  Lymphadenopathy:    She has no cervical adenopathy.  Neurological: She is alert.  Skin: Skin is warm and dry. No rash noted.  Psychiatric: She has a normal mood and affect. Her behavior is normal. Thought content normal.     Labs reviewed: No visits with results within 3 Month(s) from this visit. Latest known visit with results is:  Office Visit on 01/30/2015  Component Date Value Ref Range Status  . WBC 01/30/2015 4.3  3.4 - 10.8 x10E3/uL Final  . RBC 01/30/2015 4.09  3.77 - 5.28 x10E6/uL Final  . Hemoglobin 01/30/2015 10.9* 11.1 - 15.9 g/dL Final  . HCT 01/30/2015 33.6* 34.0 - 46.6 % Final  . MCV 01/30/2015 82  79 - 97 fL Final  . MCH 01/30/2015 26.7  26.6 - 33.0 pg Final  . MCHC 01/30/2015 32.4  31.5 - 35.7 g/dL Final  . RDW 01/30/2015 15.1  12.3 - 15.4 % Final  . Platelets 01/30/2015 238  150 - 379 x10E3/uL Final  . Neutrophils Relative % 01/30/2015 54   Final  . Lymphs 01/30/2015 30   Final  . Monocytes 01/30/2015 14   Final  . Eos 01/30/2015 1   Final  . Basos 01/30/2015 1   Final  . Neutrophils Absolute 01/30/2015 2.4  1.4 - 7.0 x10E3/uL Final  . Lymphocytes Absolute 01/30/2015 1.3  0.7 - 3.1 x10E3/uL Final  . Monocytes Absolute 01/30/2015 0.6  0.1 - 0.9 x10E3/uL Final  . Eosinophils Absolute 01/30/2015 0.0  0.0 - 0.4 x10E3/uL Final  . Basophils Absolute 01/30/2015 0.1  0.0 - 0.2 x10E3/uL Final  . Immature Granulocytes 01/30/2015 0   Final  . Immature Grans (Abs) 01/30/2015 0.0  0.0 - 0.1 x10E3/uL Final  . Glucose 01/30/2015 85  65 - 99 mg/dL Final  . BUN 01/30/2015 24  8 - 27 mg/dL  Final  . Creatinine, Ser 01/30/2015 0.91  0.57 - 1.00 mg/dL Final  . GFR calc non Af Amer 01/30/2015 59* >59 mL/min/1.73 Final  . GFR calc Af Amer 01/30/2015 68  >59 mL/min/1.73 Final  . BUN/Creatinine Ratio 01/30/2015 26  11 - 26 Final  . Sodium 01/30/2015 140  134 - 144 mmol/L Final  . Potassium 01/30/2015 4.2  3.5 - 5.2 mmol/L Final  . Chloride 01/30/2015 102  97 - 108 mmol/L Final  . CO2 01/30/2015 25  18 - 29 mmol/L Final  . Calcium 01/30/2015 9.3  8.7 -  10.3 mg/dL Final  . Total Protein 01/30/2015 8.9* 6.0 - 8.5 g/dL Final  . Albumin 01/30/2015 3.6  3.5 - 4.7 g/dL Final  . Globulin, Total 01/30/2015 5.3* 1.5 - 4.5 g/dL Final  . Albumin/Globulin Ratio 01/30/2015 0.7* 1.1 - 2.5 Final  . Bilirubin Total 01/30/2015 0.3  0.0 - 1.2 mg/dL Final  . Alkaline Phosphatase 01/30/2015 60  39 - 117 IU/L Final  . AST 01/30/2015 13  0 - 40 IU/L Final  . ALT 01/30/2015 7  0 - 32 IU/L Final  . Ferritin 01/30/2015 107  15 - 150 ng/mL Final  . Total Iron Binding Capacity 01/30/2015 245* 250 - 450 ug/dL Final  . UIBC 01/30/2015 200  118 - 369 ug/dL Final  . Iron 01/30/2015 45  27 - 139 ug/dL Final  . Iron Saturation 01/30/2015 18  15 - 55 % Final  . Cholesterol, Total 01/30/2015 160  100 - 199 mg/dL Final  . Triglycerides 01/30/2015 119  0 - 149 mg/dL Final  . HDL 01/30/2015 48  >39 mg/dL Final   Comment: According to ATP-III Guidelines, HDL-C >59 mg/dL is considered a negative risk factor for CHD.   Marland Kitchen VLDL Cholesterol Cal 01/30/2015 24  5 - 40 mg/dL Final  . LDL Calculated 01/30/2015 88  0 - 99 mg/dL Final  . Chol/HDL Ratio 01/30/2015 3.3  0.0 - 4.4 ratio units Final   Comment:                                   T. Chol/HDL Ratio                                             Men  Women                               1/2 Avg.Risk  3.4    3.3                                   Avg.Risk  5.0    4.4                                2X Avg.Risk  9.6    7.1                                3X Avg.Risk 23.4    11.0     No results found.   Assessment/Plan   ICD-9-CM ICD-10-CM   1. Osteoporosis - stable 733.00 M81.0   2. HYPERTENSION, BENIGN ESSENTIAL - stable 401.1 I10 CMP  3. Hyperlipidemia - stable 272.4 E78.5 Lipid Panel  4. Anemia, iron deficiency - stable 280.9 D50.9 CBC with Differential     Ferritin    STOP fosamax as you are taking Prolia injections for osteoporosis  Continue current medications as ordered  Continue healthy diet and exercise routine  RTO in 2-3 weeks for flu shot. [facility]  Follow up in 6 mos for [place]. Eulas Post, D. Jenetta Downer., F. A.  Suzzanne Cloud  [facility] [place], [place] [address] [phone] Cell (Monday-Friday 8 AM - 5 PM) [phone] After 5 PM and follow prompts

## 2015-08-17 NOTE — Patient Instructions (Addendum)
STOP fosamax as you are taking Prolia injections for osteoporosis  Continue current medications as ordered  Continue healthy diet and exercise routine  Return to office for flu shot in 2-3 weeks. She prefers to [facility] at the senior center  Follow up in 6 mos for CPE/MMSE/ECG

## 2015-08-18 LAB — CBC WITH DIFFERENTIAL/PLATELET
BASOS ABS: 0 10*3/uL (ref 0.0–0.2)
Basos: 1 %
EOS (ABSOLUTE): 0 10*3/uL (ref 0.0–0.4)
Eos: 1 %
HEMOGLOBIN: 10.9 g/dL — AB (ref 11.1–15.9)
Hematocrit: 34.7 % (ref 34.0–46.6)
Immature Grans (Abs): 0 10*3/uL (ref 0.0–0.1)
Immature Granulocytes: 0 %
Lymphocytes Absolute: 1.1 10*3/uL (ref 0.7–3.1)
Lymphs: 25 %
MCH: 26.5 pg — AB (ref 26.6–33.0)
MCHC: 31.4 g/dL — ABNORMAL LOW (ref 31.5–35.7)
MCV: 84 fL (ref 79–97)
MONOCYTES: 7 %
Monocytes Absolute: 0.3 10*3/uL (ref 0.1–0.9)
NEUTROS ABS: 3.1 10*3/uL (ref 1.4–7.0)
Neutrophils: 66 %
Platelets: 264 10*3/uL (ref 150–379)
RBC: 4.11 x10E6/uL (ref 3.77–5.28)
RDW: 14.7 % (ref 12.3–15.4)
WBC: 4.6 10*3/uL (ref 3.4–10.8)

## 2015-08-18 LAB — LIPID PANEL
Chol/HDL Ratio: 3.3 ratio units (ref 0.0–4.4)
Cholesterol, Total: 155 mg/dL (ref 100–199)
HDL: 47 mg/dL (ref >39)
LDL CALC: 87 mg/dL (ref 0–99)
Triglycerides: 107 mg/dL (ref 0–149)
VLDL CHOLESTEROL CAL: 21 mg/dL (ref 5–40)

## 2015-08-18 LAB — COMPREHENSIVE METABOLIC PANEL
ALBUMIN: 3.7 g/dL (ref 3.5–4.7)
ALT: 10 IU/L (ref 0–32)
AST: 16 IU/L (ref 0–40)
Albumin/Globulin Ratio: 0.7 — ABNORMAL LOW (ref 1.1–2.5)
Alkaline Phosphatase: 63 IU/L (ref 39–117)
BUN / CREAT RATIO: 17 (ref 11–26)
BUN: 17 mg/dL (ref 8–27)
Bilirubin Total: 0.3 mg/dL (ref 0.0–1.2)
CO2: 26 mmol/L (ref 18–29)
CREATININE: 1.03 mg/dL — AB (ref 0.57–1.00)
Calcium: 9.2 mg/dL (ref 8.7–10.3)
Chloride: 101 mmol/L (ref 97–108)
GFR calc non Af Amer: 51 mL/min/{1.73_m2} — ABNORMAL LOW (ref >59)
GFR, EST AFRICAN AMERICAN: 58 mL/min/{1.73_m2} — AB (ref >59)
GLUCOSE: 99 mg/dL (ref 65–99)
Globulin, Total: 5.2 g/dL — ABNORMAL HIGH (ref 1.5–4.5)
Potassium: 3.9 mmol/L (ref 3.5–5.2)
Sodium: 140 mmol/L (ref 134–144)
TOTAL PROTEIN: 8.9 g/dL — AB (ref 6.0–8.5)

## 2015-08-18 LAB — FERRITIN: FERRITIN: 144 ng/mL (ref 15–150)

## 2015-12-21 ENCOUNTER — Other Ambulatory Visit: Payer: Self-pay | Admitting: *Deleted

## 2015-12-21 MED ORDER — HYDROCHLOROTHIAZIDE 25 MG PO TABS: 25.0000 mg | ORAL_TABLET | Freq: Every day | ORAL | Status: DC

## 2015-12-21 NOTE — Telephone Encounter (Signed)
[facility] 

## 2016-01-20 ENCOUNTER — Other Ambulatory Visit: Payer: Self-pay | Admitting: Internal Medicine

## 2016-02-11 ENCOUNTER — Encounter: Payer: Self-pay | Admitting: Internal Medicine

## 2016-02-13 ENCOUNTER — Encounter: Payer: Self-pay | Admitting: Internal Medicine

## 2016-02-15 ENCOUNTER — Encounter: Payer: Medicare Other | Admitting: Internal Medicine

## 2016-02-20 ENCOUNTER — Telehealth: Payer: Self-pay

## 2016-02-20 MED ORDER — DENOSUMAB 60 MG/ML ~~LOC~~ SOLN: 60.0000 mg | Freq: Once | SUBCUTANEOUS | Status: DC

## 2016-02-20 NOTE — Telephone Encounter (Signed)
Per Earlie Server (referral coordinator) [place] is not covered. Margaret (Prolia Rep) recommends that we give patient rx and have her take it to the pharmacy to see if covered.   Spoke with patient, patient would like rx mailed. Patient will see how much Prolia is through her pharmacy and if its too expensive patient will call to request alternative. RX mailed

## 2016-03-03 NOTE — Telephone Encounter (Signed)
Patient called the office today and stated she took her Prolia RX to the pharmacy and it would be about $4 for her to get it through the pharmacy. Patient stated she could not give prolia injection to herself. I explained to patient that she would pick up her RX and bring it her to our office to have it administered. She said she didn't think she could do that because she has a hard time getting a ride. Patient asked if she declined the prolia injection of she could stay on the pills? Transferred patient to triage for clinical advice on that question.

## 2016-03-14 ENCOUNTER — Encounter: Payer: Self-pay | Admitting: Internal Medicine

## 2016-03-14 ENCOUNTER — Ambulatory Visit (INDEPENDENT_AMBULATORY_CARE_PROVIDER_SITE_OTHER): Payer: Medicare Other | Admitting: Internal Medicine

## 2016-03-14 VITALS — BP 132/78 | HR 68 | Temp 97.9°F | Resp 20 | Ht 67.0 in | Wt 155.6 lb

## 2016-03-14 DIAGNOSIS — M810 Age-related osteoporosis without current pathological fracture: Secondary | ICD-10-CM | POA: Diagnosis not present

## 2016-03-14 DIAGNOSIS — H6123 Impacted cerumen, bilateral: Secondary | ICD-10-CM | POA: Diagnosis not present

## 2016-03-14 DIAGNOSIS — D509 Iron deficiency anemia, unspecified: Secondary | ICD-10-CM

## 2016-03-14 DIAGNOSIS — G3184 Mild cognitive impairment, so stated: Secondary | ICD-10-CM

## 2016-03-14 DIAGNOSIS — I10 Essential (primary) hypertension: Secondary | ICD-10-CM

## 2016-03-14 DIAGNOSIS — Z0000 Encounter for general adult medical examination without abnormal findings: Secondary | ICD-10-CM

## 2016-03-14 DIAGNOSIS — E785 Hyperlipidemia, unspecified: Secondary | ICD-10-CM

## 2016-03-14 MED ORDER — DENOSUMAB 60 MG/ML ~~LOC~~ SOLN
60.0000 mg | Freq: Once | SUBCUTANEOUS | Status: AC
  Administered 2016-03-14: 60 mg via SUBCUTANEOUS

## 2016-03-14 NOTE — Patient Instructions (Addendum)
Encouraged her to exercise 30-45 minutes 4-5 times per week. Eat a well balanced diet. Avoid smoking. Limit alcohol intake. Wear seatbelt when riding in the car. Wear sun block (SPF >50) when spending extended times outside.   Continue using debrox drops as needed  Right ear lavage today  Prolia injection given today  Continue current medications as ordered  Follow up with eye doctor and dentist as scheduled  Follow up in 6 mos for routine visit

## 2016-03-14 NOTE — Progress Notes (Signed)
Patient ID: Jasmine Fletcher, female   DOB: 1931-10-28, 82 y.o.   MRN: [id number]   Location:   [facility]   Place of Service:   OFFICE   Patient Care Team: Gildardo Cranker, DO as PCP - General (Internal Medicine)  Extended Emergency Contact Information Primary Emergency Contact: Rella Larve [place] Phone: [phone] Work Phone: [phone] Relation: Other  Goals of Care: Advanced Directive information Advanced Directives 03/14/2016  Does patient have an advance directive? Yes  Type of Advance Directive [place]  Does patient want to make changes to advanced directive? No - Patient declined  Copy of advanced directive(s) in chart? No - copy requested  Would patient like information on creating an advanced directive? -     Chief Complaint  Patient presents with  . Annual Exam    MMSE, EKG done this visit  . Medical Management of Chronic Issues    HPI: Patient is a 82 y.o. female seen in today for an annual wellness exam.    She goes to the [facility] [employer] daily and volunteers in various projects. She needs Prolia injection today. She never got her flu shot  Increased stressors due to loss of sister from cancer (metastatic liver/lung/renal) at 49. She also notes several deaths in family over the last few mos. She feels depressed at times.  HTN - stable on lisinopril and HCTZ. No CP/SOB/palpitations. She takes ASA daily.   Hyperlipidemia - stable on statin. She maintains low fat low cholesterol diet. No myalgias  Depression screen [facility] 2/9 03/14/2016 01/30/2015 07/25/2014 03/15/2013  Decreased Interest 0 0 1 0  Down, Depressed, Hopeless 0 0 1 1  PHQ - 2 Score 0 0 2 1  Altered sleeping - - 0 -  Tired, decreased energy - - 1 -  Change in appetite - - 0 -  Feeling bad or failure about yourself  - - 0 -  Trouble concentrating - - 0 -  Moving slowly or fidgety/restless - - 0 -  Suicidal thoughts - - 0 -  PHQ-9 Score - - 3 -    Fall Risk   03/14/2016 08/17/2015 01/30/2015 07/25/2014 03/15/2013  Falls in the past year? No No No No No   MMSE - Mini Mental State Exam 03/14/2016 07/25/2014  Not completed: (No Data) -  Orientation to time 5 5  Orientation to Place 5 5  Registration 3 3  Attention/ Calculation 4 4  Recall 2 2  Language- name 2 objects 2 2  Language- repeat 1 1  Language- follow 3 step command 3 3  Language- read & follow direction 1 1  Write a sentence 1 1  Copy design 0 0  Total score 27 27     Health Maintenance  Topic Date Due  . INFLUENZA VACCINE  07/08/2016  . TETANUS/TDAP  10/15/2021  . DEXA SCAN  Completed  . ZOSTAVAX  Addressed  . PNA vac Low Risk Adult  Completed    Urinary incontinence? no  Functional Status Survey: Is the patient deaf or have difficulty hearing?: No Does the patient have difficulty seeing, even when wearing glasses/contacts?: Yes (has eye appt next month) Does the patient have difficulty concentrating, remembering, or making decisions?: No Does the patient have difficulty walking or climbing stairs?: No Does the patient have difficulty dressing or bathing?: No Does the patient have difficulty doing errands alone such as visiting a doctor's office or shopping?: No Exercise? Yes - several times per week at  [place]? Makes healthy food choices   No exam data present Dentition: plans to have teeth removed and use dentures in next month  Pain: none  Past Medical History  Diagnosis Date  . Hypertension   . Osteoporosis   . Hyperlipidemia     Past Surgical History  Procedure Laterality Date  . Abdominal hysterectomy  1998   Family History  Problem Relation Age of Onset  . Cancer Father     Social History   Social History  . Marital Status: Single    Spouse Name: N/A  . Number of Children: N/A  . Years of Education: N/A   Occupational History  . Not on file.   Social History Main Topics  . Smoking status: Former Smoker -- 1.00 packs/day  .  Smokeless tobacco: Never Used  . Alcohol Use: No  . Drug Use: No  . Sexual Activity: Not on file   Other Topics Concern  . Not on file   Social History Narrative    No Known Allergies    Medication List       This list is accurate as of: 03/14/16 10:37 AM.  Always use your most recent med list.               aspirin 81 MG tablet  Take 81 mg by mouth daily.     calcium-vitamin D 500-200 MG-UNIT tablet  Commonly known as:  OSCAL 500/200 D-3  Take 1 tablet by mouth 2 (two) times daily.     carbamide peroxide 6.5 % otic solution  Commonly known as:  DEBROX  Place 5 drops into both ears 2 (two) times daily.     denosumab 60 MG/ML Soln injection  Commonly known as:  PROLIA  Inject 60 mg into the skin once. Administer in upper arm, thigh, or abdomen     hydrochlorothiazide 25 MG tablet  Commonly known as:  HYDRODIURIL  Take 1 tablet (25 mg total) by mouth daily.     lisinopril 5 MG tablet  Commonly known as:  PRINIVIL,ZESTRIL  take 1 tablet by mouth once daily     simvastatin 10 MG tablet  Commonly known as:  ZOCOR  take 1 tablet by mouth once daily         Review of Systems:  Review of Systems  Unable to perform ROS: Other  cognitive impairment  Physical Exam: Filed Vitals:   03/14/16 0938  BP: 132/78  Pulse: 68  Temp: 97.9 F (36.6 C)  TempSrc: Oral  Resp: 20  Height: 5\' 7"  (1.702 m)  Weight: 155 lb 9.6 oz (70.58 kg)  SpO2: 96%   Body mass index is 24.36 kg/(m^2). Physical Exam  Constitutional: She is oriented to person, place, and time. She appears well-developed and well-nourished. No distress.  HENT:  Head: Normocephalic and atraumatic.  Right Ear: Hearing, tympanic membrane, external ear and ear canal normal.  Left Ear: Hearing, tympanic membrane, external ear and ear canal normal.  Mouth/Throat: Uvula is midline, oropharynx is clear and moist and mucous membranes are normal. She does not have dentures.  Eyes: Conjunctivae, EOM and lids are  normal. Pupils are equal, round, and reactive to light. No scleral icterus.  Neck: Trachea normal and normal range of motion. Neck supple. Carotid bruit is not present. No thyroid mass and no thyromegaly present.  Cardiovascular: Normal rate, regular rhythm, normal heart sounds and intact distal pulses.  Exam reveals no gallop and no friction rub.   No murmur  heard. No carotid bruit b/l. No LE edema b/l. No calf TTP.   Pulmonary/Chest: Effort normal and breath sounds normal. She has no wheezes. She has no rhonchi. She has no rales.  Abdominal: Soft. Normal appearance, normal aorta and bowel sounds are normal. She exhibits no pulsatile midline mass and no mass. There is no hepatosplenomegaly. There is no tenderness. There is no rigidity, no rebound and no guarding. No hernia.  Genitourinary:  Deferred due to age  Musculoskeletal: She exhibits edema (small and large joints) and tenderness.  Thoracic kyphoscoliosis  Lymphadenopathy:       Head (right side): No posterior auricular adenopathy present.       Head (left side): No posterior auricular adenopathy present.    She has no cervical adenopathy.       Right: No supraclavicular adenopathy present.       Left: No supraclavicular adenopathy present.  Neurological: She is alert and oriented to person, place, and time. She has normal strength and normal reflexes. No cranial nerve deficit. Gait normal.  Skin: Skin is warm, dry and intact. No rash noted. Nails show no clubbing.  Psychiatric: She has a normal mood and affect. Her speech is normal and behavior is normal. Thought content normal. Cognition and memory are normal.    Labs reviewed: Basic Metabolic Panel:  Recent Labs  08/17/15 1127  NA 140  K 3.9  CL 101  CO2 26  GLUCOSE 99  BUN 17  CREATININE 1.03*  CALCIUM 9.2   Liver Function Tests:  Recent Labs  08/17/15 1127  AST 16  ALT 10  ALKPHOS 63  BILITOT 0.3  PROT 8.9*  ALBUMIN 3.7   No results for input(s): LIPASE,  AMYLASE in the last 8760 hours. No results for input(s): AMMONIA in the last 8760 hours. CBC:  Recent Labs  08/17/15 1127  WBC 4.6  NEUTROABS 3.1  HCT 34.7  MCV 84  PLT 264   Lipid Panel:  Recent Labs  08/17/15 1127  CHOL 155  HDL 47  LDLCALC 87  TRIG 107  CHOLHDL 3.3   No results found for: HGBA1C  Procedures:  ECG OBTAINED AND REVIEWED BY MYSELF:  SB @ 54 bpm, nml axis, LAE, incomplete RBBB, poor R wave progression. No acute ischemic changes. No change since 07/2013  Assessment/Plan   ICD-9-CM ICD-10-CM   1. Well adult exam V70.0 Z00.00   2. Osteoporosis 733.00 M81.0   3. Hyperlipidemia 272.4 E78.5 Lipid Panel     TSH  4. HYPERTENSION, BENIGN ESSENTIAL 401.1 I10 CBC with Differential     CMP     Urinalysis with Reflex Microscopic  5. Anemia, iron deficiency 280.9 D50.9 CBC with Differential  6. Mild cognitive impairment 331.83 G31.84   7. Cerumen impaction, bilateral 380.4 H61.23     Pt is UTD on health maintenance. Vaccinations are UTD. Pt maintains a healthy lifestyle. Encouraged pt to exercise 30-45 minutes 4-5 times per week. Eat a well balanced diet. Avoid smoking. Limit alcohol intake. Wear seatbelt when riding in the car. Wear sun block (SPF >50) when spending extended times outside.  Continue using debrox drops as needed  Right ear lavage today  Prolia injection given today  Continue current medications as ordered  Follow up with eye doctor and dentist as scheduled  Follow up in 6 mos for routine visit  Austina Constantin S. Perlie Gold  [facility] [address] [place], [place] [address] [phone] Cell (  Monday-Friday 8 AM - 5 PM) [id number] After 5 PM and follow prompts

## 2016-03-15 LAB — CBC WITH DIFFERENTIAL/PLATELET
BASOS ABS: 0 10*3/uL (ref 0.0–0.2)
Basos: 1 %
EOS (ABSOLUTE): 0.1 10*3/uL (ref 0.0–0.4)
Eos: 2 %
HEMATOCRIT: 34.4 % (ref 34.0–46.6)
Hemoglobin: 11.3 g/dL (ref 11.1–15.9)
IMMATURE GRANS (ABS): 0 10*3/uL (ref 0.0–0.1)
IMMATURE GRANULOCYTES: 0 %
LYMPHS: 32 %
Lymphocytes Absolute: 1.4 10*3/uL (ref 0.7–3.1)
MCH: 26.8 pg (ref 26.6–33.0)
MCHC: 32.8 g/dL (ref 31.5–35.7)
MCV: 82 fL (ref 79–97)
MONOCYTES: 11 %
Monocytes Absolute: 0.5 10*3/uL (ref 0.1–0.9)
NEUTROS ABS: 2.4 10*3/uL (ref 1.4–7.0)
NEUTROS PCT: 54 %
Platelets: 217 10*3/uL (ref 150–379)
RBC: 4.21 x10E6/uL (ref 3.77–5.28)
RDW: 14.9 % (ref 12.3–15.4)
WBC: 4.3 10*3/uL (ref 3.4–10.8)

## 2016-03-15 LAB — MICROSCOPIC EXAMINATION
Bacteria, UA: NONE SEEN
Casts: NONE SEEN /lpf

## 2016-03-15 LAB — COMPREHENSIVE METABOLIC PANEL
A/G RATIO: 0.7 — AB (ref 1.2–2.2)
ALT: 11 IU/L (ref 0–32)
AST: 13 IU/L (ref 0–40)
Albumin: 3.8 g/dL (ref 3.5–4.7)
Alkaline Phosphatase: 64 IU/L (ref 39–117)
BUN / CREAT RATIO: 20 (ref 12–28)
BUN: 19 mg/dL (ref 8–27)
Bilirubin Total: <0.2 mg/dL (ref 0.0–1.2)
CALCIUM: 9.4 mg/dL (ref 8.7–10.3)
CO2: 25 mmol/L (ref 18–29)
Chloride: 97 mmol/L (ref 96–106)
Creatinine, Ser: 0.95 mg/dL (ref 0.57–1.00)
GFR, EST AFRICAN AMERICAN: 65 mL/min/{1.73_m2} (ref >59)
GFR, EST NON AFRICAN AMERICAN: 56 mL/min/{1.73_m2} — AB (ref >59)
GLOBULIN, TOTAL: 5.8 g/dL — AB (ref 1.5–4.5)
Glucose: 79 mg/dL (ref 65–99)
POTASSIUM: 3.8 mmol/L (ref 3.5–5.2)
SODIUM: 137 mmol/L (ref 134–144)
TOTAL PROTEIN: 9.6 g/dL — AB (ref 6.0–8.5)

## 2016-03-15 LAB — URINALYSIS, ROUTINE W REFLEX MICROSCOPIC
Bilirubin, UA: NEGATIVE
GLUCOSE, UA: NEGATIVE
Ketones, UA: NEGATIVE
Leukocytes, UA: NEGATIVE
Nitrite, UA: NEGATIVE
SPEC GRAV UA: 1.021 (ref 1.005–1.030)
UUROB: 0.2 mg/dL (ref 0.2–1.0)
pH, UA: 7.5 (ref 5.0–7.5)

## 2016-03-15 LAB — LIPID PANEL
CHOL/HDL RATIO: 3.4 ratio (ref 0.0–4.4)
Cholesterol, Total: 150 mg/dL (ref 100–199)
HDL: 44 mg/dL (ref >39)
LDL Calculated: 75 mg/dL (ref 0–99)
Triglycerides: 154 mg/dL — ABNORMAL HIGH (ref 0–149)
VLDL Cholesterol Cal: 31 mg/dL (ref 5–40)

## 2016-03-15 LAB — TSH: TSH: 4.82 u[IU]/mL — AB (ref 0.450–4.500)

## 2016-03-17 ENCOUNTER — Other Ambulatory Visit: Payer: Self-pay

## 2016-03-17 DIAGNOSIS — E785 Hyperlipidemia, unspecified: Secondary | ICD-10-CM

## 2016-04-16 ENCOUNTER — Other Ambulatory Visit: Payer: Self-pay | Admitting: Internal Medicine

## 2016-04-28 ENCOUNTER — Other Ambulatory Visit: Payer: Medicare Other

## 2016-04-28 DIAGNOSIS — E785 Hyperlipidemia, unspecified: Secondary | ICD-10-CM | POA: Diagnosis not present

## 2016-04-29 LAB — TSH: TSH: 6.32 u[IU]/mL — ABNORMAL HIGH (ref 0.450–4.500)

## 2016-08-19 ENCOUNTER — Other Ambulatory Visit: Payer: Self-pay | Admitting: Internal Medicine

## 2016-08-26 ENCOUNTER — Encounter: Payer: Self-pay | Admitting: Internal Medicine

## 2016-09-17 ENCOUNTER — Ambulatory Visit: Payer: Self-pay | Admitting: Internal Medicine

## 2016-09-25 ENCOUNTER — Telehealth: Payer: Self-pay

## 2016-09-25 MED ORDER — PROLIA 60 MG/ML ~~LOC~~ SOLN: 60.0000 mg | SUBCUTANEOUS | 0 refills | Status: DC

## 2016-09-25 NOTE — Telephone Encounter (Signed)
Spoke with patient, patient will pick-up Prolia from her pharmacy the day before her appointment, refrigerate, and bring in for administration

## 2016-10-08 ENCOUNTER — Ambulatory Visit (INDEPENDENT_AMBULATORY_CARE_PROVIDER_SITE_OTHER): Payer: Medicare Other | Admitting: Internal Medicine

## 2016-10-08 ENCOUNTER — Encounter: Payer: Self-pay | Admitting: Internal Medicine

## 2016-10-08 VITALS — BP 158/76 | HR 52 | Temp 97.6°F | Ht 67.0 in | Wt 152.6 lb

## 2016-10-08 DIAGNOSIS — Z79899 Other long term (current) drug therapy: Secondary | ICD-10-CM

## 2016-10-08 DIAGNOSIS — M810 Age-related osteoporosis without current pathological fracture: Secondary | ICD-10-CM | POA: Diagnosis not present

## 2016-10-08 DIAGNOSIS — E782 Mixed hyperlipidemia: Secondary | ICD-10-CM

## 2016-10-08 DIAGNOSIS — I10 Essential (primary) hypertension: Secondary | ICD-10-CM

## 2016-10-08 DIAGNOSIS — R946 Abnormal results of thyroid function studies: Secondary | ICD-10-CM | POA: Diagnosis not present

## 2016-10-08 DIAGNOSIS — R7989 Other specified abnormal findings of blood chemistry: Secondary | ICD-10-CM

## 2016-10-08 LAB — TSH: TSH: 3.79 m[IU]/L

## 2016-10-08 LAB — BASIC METABOLIC PANEL WITH GFR
BUN: 22 mg/dL (ref 7–25)
CALCIUM: 9.1 mg/dL (ref 8.6–10.4)
CO2: 25 mmol/L (ref 20–31)
Chloride: 102 mmol/L (ref 98–110)
Creat: 1.05 mg/dL — ABNORMAL HIGH (ref 0.60–0.88)
GFR, EST AFRICAN AMERICAN: 57 mL/min — AB (ref >=60)
GFR, EST NON AFRICAN AMERICAN: 49 mL/min — AB (ref >=60)
Glucose, Bld: 75 mg/dL (ref 65–99)
Potassium: 4.2 mmol/L (ref 3.5–5.3)
SODIUM: 138 mmol/L (ref 135–146)

## 2016-10-08 LAB — T4, FREE: FREE T4: 1 ng/dL (ref 0.8–1.8)

## 2016-10-08 LAB — ALT: ALT: 6 U/L (ref 6–29)

## 2016-10-08 MED ORDER — DENOSUMAB 60 MG/ML ~~LOC~~ SOLN
60.0000 mg | Freq: Once | SUBCUTANEOUS | Status: AC
Start: 2016-10-08 — End: 2016-10-08
  Administered 2016-10-08: 60 mg via SUBCUTANEOUS

## 2016-10-08 NOTE — Patient Instructions (Signed)
Continue current medications as ordered  Will call with lab results  Follow up in 6 mos for CPE   

## 2016-10-08 NOTE — Progress Notes (Signed)
Patient ID: Jasmine Fletcher, female   DOB: [birth year], 82 y.o.   MRN: [id number]    Location:  [facility] Place of Service: OFFICE  Chief Complaint  Patient presents with  . Medical Management of Chronic Issues    6 Month routine visit    HPI:  82 yo female seen today for f/u. She has reduced appetite and is stressed due to illness in family. Youngest sister just had sx and her older sister has end stage Alzheimer's.   She goes to the [facility] daily and volunteers in various projects. She got Prolia injection today. She already had her flu shot  Increased stressors due to loss of sister from cancer (metastatic liver/lung/renal) at 62. She also notes several deaths in family over the last few mos. She feels depressed at times.  HTN - stable on lisinopril and HCTZ. No CP/SOB/palpitations. She takes ASA daily.   Hyperlipidemia - stable on simvastatin. She maintains low fat low cholesterol diet. No myalgias. LDL 75  Abnormal TSH - last level 6.320 (prev 4.82)  Past Medical History:  Diagnosis Date  . Hyperlipidemia   . Hypertension   . Osteoporosis     Past Surgical History:  Procedure Laterality Date  . ABDOMINAL HYSTERECTOMY  1998    Patient Care Team: Gildardo Cranker, DO as PCP - General (Internal Medicine)  Social History   Social History  . Marital status: Single    Spouse name: N/A  . Number of children: N/A  . Years of education: N/A   Occupational History  . Not on file.   Social History Main Topics  . Smoking status: Former Smoker    Packs/day: 1.00  . Smokeless tobacco: Never Used  . Alcohol use No  . Drug use: No  . Sexual activity: Not on file   Other Topics Concern  . Not on file   Social History Narrative  . No narrative on file     reports that she has quit smoking. She smoked 1.00 pack per day. She has never used smokeless tobacco. She reports that she does not drink alcohol or use drugs.  Family History  Problem Relation Age of Onset  .  Cancer Father    Family Status  Relation Status  . Mother Deceased  . Father Deceased   Cause of Death: Cancer  . Sister Alive  . Daughter Alive  . Son Alive  . Sister Alive  . Sister Alive     No Known Allergies  Medications: Patient's Medications  New Prescriptions   No medications on file  Previous Medications   ASPIRIN 81 MG TABLET    Take 81 mg by mouth daily.   CALCIUM-VITAMIN D (OSCAL 500/200 D-3) 500-200 MG-UNIT PER TABLET    Take 1 tablet by mouth 2 (two) times daily.   CARBAMIDE PEROXIDE (DEBROX) 6.5 % OTIC SOLUTION    Place 5 drops into both ears 2 (two) times daily.   HYDROCHLOROTHIAZIDE (HYDRODIURIL) 25 MG TABLET    take 1 tablet by mouth once daily   LISINOPRIL (PRINIVIL,ZESTRIL) 5 MG TABLET    take 1 tablet by mouth once daily   PROLIA 60 MG/ML SOLN INJECTION    Inject 60 mg into the skin every 6 (six) months.   SIMVASTATIN (ZOCOR) 10 MG TABLET    take 1 tablet by mouth once daily  Modified Medications   No medications on file  Discontinued Medications   No medications on file    Review of Systems  Psychiatric/Behavioral:  Positive for dysphoric mood.  All other systems reviewed and are negative.   Vitals:   10/08/16 1111  BP: (!) 158/76  Pulse: (!) 52  Temp: 97.6 F (36.4 C)  TempSrc: Oral  SpO2: 98%  Weight: 152 lb 9.6 oz (69.2 kg)  Height: _0  (1.702 m)   Body mass index is 23.9 kg/m.  Physical Exam  Constitutional: She appears well-developed and well-nourished.  HENT:  Mouth/Throat: Oropharynx is clear and moist. No oropharyngeal exudate.  Eyes: Pupils are equal, round, and reactive to light. No scleral icterus.  Neck: Neck supple. Carotid bruit is not present. No tracheal deviation present. No thyromegaly present.  Cardiovascular: Normal rate, regular rhythm, normal heart sounds and intact distal pulses.  Exam reveals no gallop and no friction rub.   No murmur heard. No LE edema b/l. no calf TTP.   Pulmonary/Chest: Effort normal and  breath sounds normal. No stridor. No respiratory distress. She has no wheezes. She has no rales.  Abdominal: Soft. Bowel sounds are normal. She exhibits no distension and no mass. There is no hepatomegaly. There is no tenderness. There is no rebound and no guarding.  Musculoskeletal: She exhibits deformity (thoracic kyphosis).  Lymphadenopathy:    She has no cervical adenopathy.  Neurological: She is alert.  Skin: Skin is warm and dry. No rash noted.  Psychiatric: She has a normal mood and affect. Her behavior is normal. Thought content normal.     Labs reviewed: No visits with results within 3 Month(s) from this visit.  Latest known visit with results is:  Appointment on 04/28/2016  Component Date Value Ref Range Status  . TSH 04/29/2016 6.320* 0.450 - 4.500 uIU/mL Final    No results found.   Assessment/Plan   ICD-9-CM ICD-10-CM   1. Senile osteoporosis 733.01 M81.0 denosumab (PROLIA) injection 60 mg  2. HYPERTENSION, BENIGN ESSENTIAL 401.1 I10 BMP with eGFR     ALT     ALT     BMP with eGFR     TSH     T4, Free  3. Mixed hyperlipidemia 272.2 E78.2 ALT     ALT     BMP with eGFR     TSH     T4, Free  4. Abnormal TSH 790.6 R94.6 BMP with eGFR     ALT     TSH     T4, Free     ALT     BMP with eGFR     TSH     T4, Free  5. High risk medication use V58.69 Z79.899 BMP with eGFR     ALT     ALT     BMP with eGFR     TSH     T4, Free   Continue current medications as ordered  Will call with lab results  Follow up in 6 mos for CPE. Will check fasting labs at that appt (cbc w diff, cmp, lipid panel, tsh, ua)    Maizey Menendez S. Perlie Gold  [facility] [address] [place], [place] [address] [phone] Cell (Monday-Friday 8 AM - 5 PM) [phone] After 5 PM and follow prompts

## 2016-12-17 ENCOUNTER — Other Ambulatory Visit: Payer: Self-pay | Admitting: Internal Medicine

## 2017-01-16 ENCOUNTER — Other Ambulatory Visit: Payer: Self-pay | Admitting: Internal Medicine

## 2017-01-22 ENCOUNTER — Encounter: Payer: Self-pay | Admitting: Internal Medicine

## 2017-01-26 ENCOUNTER — Encounter: Payer: Self-pay | Admitting: Internal Medicine

## 2017-03-05 ENCOUNTER — Emergency Department (HOSPITAL_COMMUNITY): Payer: Medicare Other

## 2017-03-05 ENCOUNTER — Emergency Department (HOSPITAL_COMMUNITY)
Admission: EM | Admit: 2017-03-05 | Discharge: 2017-03-05 | Disposition: A | Discharge status: Home / Self Care | Payer: Medicare Other | Attending: Emergency Medicine | Admitting: Emergency Medicine

## 2017-03-05 ENCOUNTER — Encounter (HOSPITAL_COMMUNITY): Payer: Self-pay | Admitting: Emergency Medicine

## 2017-03-05 DIAGNOSIS — R55 Syncope and collapse: Secondary | ICD-10-CM | POA: Insufficient documentation

## 2017-03-05 DIAGNOSIS — I10 Essential (primary) hypertension: Secondary | ICD-10-CM | POA: Insufficient documentation

## 2017-03-05 DIAGNOSIS — Z87891 Personal history of nicotine dependence: Secondary | ICD-10-CM | POA: Diagnosis not present

## 2017-03-05 DIAGNOSIS — Z7982 Long term (current) use of aspirin: Secondary | ICD-10-CM | POA: Insufficient documentation

## 2017-03-05 DIAGNOSIS — R404 Transient alteration of awareness: Secondary | ICD-10-CM | POA: Diagnosis not present

## 2017-03-05 DIAGNOSIS — R42 Dizziness and giddiness: Secondary | ICD-10-CM | POA: Diagnosis not present

## 2017-03-05 LAB — HEPATIC FUNCTION PANEL
ALBUMIN: 3.2 g/dL — AB (ref 3.5–5.0)
ALK PHOS: 52 U/L (ref 38–126)
ALT: 11 U/L — ABNORMAL LOW (ref 14–54)
AST: 18 U/L (ref 15–41)
BILIRUBIN TOTAL: 0.2 mg/dL — AB (ref 0.3–1.2)
Total Protein: 10.1 g/dL — ABNORMAL HIGH (ref 6.5–8.1)

## 2017-03-05 LAB — I-STAT TROPONIN, ED: TROPONIN I, POC: 0 ng/mL (ref 0.00–0.08)

## 2017-03-05 LAB — URINALYSIS, ROUTINE W REFLEX MICROSCOPIC
BACTERIA UA: NONE SEEN
BILIRUBIN URINE: NEGATIVE
Glucose, UA: NEGATIVE mg/dL
HGB URINE DIPSTICK: NEGATIVE
KETONES UR: NEGATIVE mg/dL
LEUKOCYTES UA: NEGATIVE
NITRITE: NEGATIVE
Protein, ur: 30 mg/dL — AB
SPECIFIC GRAVITY, URINE: 1.013 (ref 1.005–1.030)
pH: 7 (ref 5.0–8.0)

## 2017-03-05 LAB — CBG MONITORING, ED: GLUCOSE-CAPILLARY: 101 mg/dL — AB (ref 65–99)

## 2017-03-05 LAB — CBC
HEMATOCRIT: 33.8 % — AB (ref 36.0–46.0)
HEMOGLOBIN: 10.8 g/dL — AB (ref 12.0–15.0)
MCH: 26.9 pg (ref 26.0–34.0)
MCHC: 32 g/dL (ref 30.0–36.0)
MCV: 84.3 fL (ref 78.0–100.0)
Platelets: 229 10*3/uL (ref 150–400)
RBC: 4.01 MIL/uL (ref 3.87–5.11)
RDW: 14.6 % (ref 11.5–15.5)
WBC: 4.3 10*3/uL (ref 4.0–10.5)

## 2017-03-05 LAB — BASIC METABOLIC PANEL
Anion gap: 7 (ref 5–15)
BUN: 22 mg/dL — AB (ref 6–20)
CHLORIDE: 101 mmol/L (ref 101–111)
CO2: 29 mmol/L (ref 22–32)
Calcium: 9.2 mg/dL (ref 8.9–10.3)
Creatinine, Ser: 1.05 mg/dL — ABNORMAL HIGH (ref 0.44–1.00)
GFR calc Af Amer: 55 mL/min — ABNORMAL LOW (ref >60)
GFR, EST NON AFRICAN AMERICAN: 48 mL/min — AB (ref >60)
GLUCOSE: 103 mg/dL — AB (ref 65–99)
Potassium: 3.6 mmol/L (ref 3.5–5.1)
Sodium: 137 mmol/L (ref 135–145)

## 2017-03-05 NOTE — ED Triage Notes (Signed)
Pt lives independently and performs all ADLs. No hx of dementia or alzheimers. Was at church and became dizzy and slowly fell to grassy ground and caught herself. Denies pain anywhere. Daughter was notified and she states "this has happened before and due to her grief for son which becomes particularly intense during this time of year". Pt is tearful and kleenex given. VSS; cbg 117.

## 2017-03-05 NOTE — ED Notes (Signed)
Patient transported to CT on stretcher with tech. 

## 2017-03-05 NOTE — ED Notes (Signed)
Patient transported to X-ray 

## 2017-03-05 NOTE — ED Provider Notes (Signed)
[facility] DEPT Provider Note   CSN: [id number] Arrival date & time: 03/05/17  1005     History   Chief Complaint Chief Complaint  Patient presents with  . Loss of Consciousness    HPI Jasmine Fletcher is a 83 y.o. female.  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 No language interpreter was used.  Loss of Consciousness   This is a new problem. The current episode started 3 to 5 hours ago. The problem occurs rarely. The problem has been resolved. She lost consciousness for a period of less than one minute. The problem is associated with normal activity. Associated symptoms include visual change. Pertinent negatives include abdominal pain, back pain, chest pain, congestion, headaches and seizures. She has tried nothing for the symptoms.    Past Medical History:  Diagnosis Date  . Hyperlipidemia   . Hypertension   . Osteoporosis     Patient Active Problem List   Diagnosis Date Noted  . Routine general medical examination at a health care facility 07/25/2014  . Mild cognitive impairment 07/25/2014  . Other and unspecified hyperlipidemia 11/29/2013  . Osteoporosis 03/15/2013  . BRADYCARDIA 02/17/2009  . HYPERTENSION, BENIGN ESSENTIAL 02/16/2009  . ANEMIA, HYPOCHROMIC 07/26/2007  . DISORDER, TOBACCO USE 07/26/2007  . DEPRESSION 07/26/2007  . DELIVERY, NORMAL 07/26/2007  . CARDIAC CATHETERIZATION, LEFT, HX OF 07/26/2007    Past Surgical History:  Procedure Laterality Date  . ABDOMINAL HYSTERECTOMY  1998    OB History    No data available       Home Medications    Prior to Admission medications   Medication Sig Start Date End Date Taking? Authorizing Provider  aspirin 81 MG tablet Take 81 mg by mouth daily.    Historical Provider, MD  calcium-vitamin D (OSCAL 500/200 D-3) 500-200 MG-UNIT per tablet Take 1 tablet by mouth 2 (two)  times daily. 07/25/14   Blanchie Serve, MD  carbamide peroxide (DEBROX) 6.5 % otic solution Place 5 drops into both ears 2 (two) times daily. 07/25/14   Blanchie Serve, MD  hydrochlorothiazide (HYDRODIURIL) 25 MG tablet take 1 tablet by mouth once daily 12/17/16   Gildardo Cranker, DO  lisinopril (PRINIVIL,ZESTRIL) 5 MG tablet take 1 tablet by mouth once daily 01/16/17   Gildardo Cranker, DO  PROLIA 60 MG/ML SOLN injection Inject 60 mg into the skin every 6 (six) months. 09/25/16   Gildardo Cranker, DO  simvastatin (ZOCOR) 10 MG tablet take 1 tablet by mouth once daily 03/28/15   Estill Dooms, MD    Family History Family History  Problem Relation Age of Onset  . Cancer Father     Social History Social History  Substance Use Topics  . Smoking status: Former Smoker    Packs/day: 1.00  . Smokeless tobacco: Never Used  . Alcohol use No     Allergies   Patient has no known allergies.   Review of Systems Review of Systems  Constitutional: Negative for appetite change and fatigue.  HENT: Negative for congestion, ear discharge and sinus pressure.   Eyes: Negative for discharge.  Respiratory: Negative for cough.   Cardiovascular: Positive for syncope. Negative for chest pain.  Gastrointestinal: Negative for abdominal pain and diarrhea.  Genitourinary: Negative for frequency and hematuria.  Musculoskeletal: Negative for back pain.  Skin: Negative for rash.  Neurological: Negative for seizures and headaches.  Psychiatric/Behavioral: Negative for hallucinations.     Physical Exam Updated Vital Signs BP 140/76   Pulse (!) 53   Temp 97.9 F (36.6 C) (Oral)   Resp 18   SpO2 100%   Physical Exam  Constitutional: She is oriented to person, place, and time. She appears well-developed.  HENT:  Head: Normocephalic.  Eyes: Conjunctivae and EOM are normal. No scleral icterus.  Neck: Neck supple. No thyromegaly present.  Cardiovascular: Normal rate and regular rhythm.  Exam reveals no gallop and  no friction rub.   No murmur heard. Pulmonary/Chest: No stridor. She has no wheezes. She has no rales. She exhibits no tenderness.  Abdominal: She exhibits no distension. There is no tenderness. There is no rebound.  Musculoskeletal: Normal range of motion. She exhibits no edema.  Lymphadenopathy:    She has no cervical adenopathy.  Neurological: She is oriented to person, place, and time. She exhibits normal muscle tone. Coordination normal.  Skin: No rash noted. No erythema.  Psychiatric: She has a normal mood and affect. Her behavior is normal.     ED Treatments / Results  Labs (all labs ordered are listed, but only abnormal results are displayed) Labs Reviewed  BASIC METABOLIC PANEL - Abnormal; Notable for the following:       Result Value   Glucose, Bld 103 (*)    BUN 22 (*)    Creatinine, Ser 1.05 (*)    GFR calc non Af Amer 48 (*)    GFR calc Af Amer 55 (*)    All other components within normal limits  CBC - Abnormal; Notable for the following:    Hemoglobin 10.8 (*)    HCT 33.8 (*)    All other components within normal limits  URINALYSIS, ROUTINE W REFLEX MICROSCOPIC - Abnormal; Notable for the following:    Protein, ur 30 (*)    Squamous Epithelial / LPF 0-5 (*)    All other components within normal limits  HEPATIC FUNCTION PANEL - Abnormal; Notable for the following:    Total Protein 10.1 (*)    Albumin 3.2 (*)    ALT 11 (*)    Total Bilirubin 0.2 (*)    Bilirubin, Direct <0.1 (*)    All other components within normal limits  CBG MONITORING, ED - Abnormal; Notable for the following:    Glucose-Capillary 101 (*)    All other components within normal limits  I-STAT TROPOININ, ED    EKG  EKG Interpretation None       Radiology Dg Chest 2 View  Result Date: 03/05/2017 CLINICAL DATA:  Dizziness, weakness EXAM: CHEST  2 VIEW COMPARISON:  01/29/2014 FINDINGS: There is hyperinflation of the lungs compatible with COPD. Biapical scarring. Heart is borderline in  size. No acute airspace opacities, effusions or edema. No acute bony abnormality. Thoracolumbar scoliosis again noted, stable. IMPRESSION: COPD/chronic changes.  No active disease. Electronically Signed   By: Rolm Baptise M.D.   On: 03/05/2017 11:20   Ct Head Wo Contrast  Result Date: 03/05/2017 CLINICAL DATA:  Recent syncopal episode EXAM: CT HEAD WITHOUT CONTRAST TECHNIQUE: Contiguous axial images were obtained from the base of the skull through the vertex without intravenous contrast. COMPARISON:  None. FINDINGS: Brain: Mild atrophic changes are noted. No findings to suggest acute hemorrhage, acute infarction or space-occupying mass lesion are seen. Vascular: No hyperdense vessel or unexpected calcification. Skull: Normal. Negative for fracture or focal lesion. Sinuses/Orbits: No acute finding. Other: No acute abnormality noted. IMPRESSION: Mild atrophic changes  without acute abnormality. Electronically Signed   By: Inez Catalina M.D.   On: 03/05/2017 12:04    Procedures Procedures (including critical care time)  Medications Ordered in ED Medications - No data to display   Initial Impression / Assessment and Plan / ED Course  I have reviewed the triage vital signs and the nursing notes.  Pertinent labs & imaging results that were available during my care of the patient were reviewed by me and considered in my medical decision making (see chart for details).     Patient had a syncopal episode. Her labs EKG CT scan all unremarkable. Patient wishes to go home. She is not orthostatic and feels fine. I told her she needs to stay with a family member through the weekend and follow-up with her PCP next week. If any problems she is to return  Final Clinical Impressions(s) / ED Diagnoses   Final diagnoses:  Syncope and collapse    New Prescriptions New Prescriptions   No medications on file     Milton Ferguson, MD 03/05/17 1448

## 2017-03-05 NOTE — ED Notes (Signed)
Pt CBG was 101, notified Julia(RN)

## 2017-03-05 NOTE — Discharge Instructions (Signed)
Make sure you have somebody with you through the weekend. Return if any problems. Follow-up with your family doctor next week

## 2017-04-08 ENCOUNTER — Encounter: Payer: Self-pay | Admitting: Internal Medicine

## 2017-04-08 ENCOUNTER — Ambulatory Visit: Payer: Self-pay

## 2017-05-25 ENCOUNTER — Ambulatory Visit: Payer: Medicare Other

## 2017-05-27 ENCOUNTER — Ambulatory Visit (INDEPENDENT_AMBULATORY_CARE_PROVIDER_SITE_OTHER): Payer: Medicare Other | Admitting: Internal Medicine

## 2017-05-27 ENCOUNTER — Encounter: Payer: Self-pay | Admitting: Internal Medicine

## 2017-05-27 ENCOUNTER — Ambulatory Visit: Payer: Self-pay

## 2017-05-27 VITALS — BP 144/72 | HR 64 | Temp 97.8°F | Ht 67.0 in | Wt 148.2 lb

## 2017-05-27 DIAGNOSIS — D509 Iron deficiency anemia, unspecified: Secondary | ICD-10-CM

## 2017-05-27 DIAGNOSIS — E782 Mixed hyperlipidemia: Secondary | ICD-10-CM

## 2017-05-27 DIAGNOSIS — Z0000 Encounter for general adult medical examination without abnormal findings: Secondary | ICD-10-CM

## 2017-05-27 DIAGNOSIS — Z1231 Encounter for screening mammogram for malignant neoplasm of breast: Secondary | ICD-10-CM

## 2017-05-27 DIAGNOSIS — R946 Abnormal results of thyroid function studies: Secondary | ICD-10-CM | POA: Diagnosis not present

## 2017-05-27 DIAGNOSIS — M810 Age-related osteoporosis without current pathological fracture: Secondary | ICD-10-CM

## 2017-05-27 DIAGNOSIS — R7989 Other specified abnormal findings of blood chemistry: Secondary | ICD-10-CM

## 2017-05-27 DIAGNOSIS — I10 Essential (primary) hypertension: Secondary | ICD-10-CM

## 2017-05-27 DIAGNOSIS — Z1239 Encounter for other screening for malignant neoplasm of breast: Secondary | ICD-10-CM

## 2017-05-27 DIAGNOSIS — Z79899 Other long term (current) drug therapy: Secondary | ICD-10-CM

## 2017-05-27 DIAGNOSIS — E2839 Other primary ovarian failure: Secondary | ICD-10-CM

## 2017-05-27 DIAGNOSIS — H6123 Impacted cerumen, bilateral: Secondary | ICD-10-CM | POA: Diagnosis not present

## 2017-05-27 DIAGNOSIS — F0390 Unspecified dementia without behavioral disturbance: Secondary | ICD-10-CM

## 2017-05-27 LAB — LIPID PANEL
Cholesterol: 178 mg/dL (ref <200)
HDL: 44 mg/dL — ABNORMAL LOW (ref >50)
LDL CALC: 109 mg/dL — AB (ref <100)
TRIGLYCERIDES: 123 mg/dL (ref <150)
Total CHOL/HDL Ratio: 4 Ratio (ref <5.0)
VLDL: 25 mg/dL (ref <30)

## 2017-05-27 LAB — CBC WITH DIFFERENTIAL/PLATELET
BASOS ABS: 42 {cells}/uL (ref 0–200)
BASOS PCT: 1 %
Eosinophils Absolute: 42 cells/uL (ref 15–500)
Eosinophils Relative: 1 %
HCT: 35.4 % (ref 35.0–45.0)
HEMOGLOBIN: 11.1 g/dL — AB (ref 11.7–15.5)
LYMPHS ABS: 1302 {cells}/uL (ref 850–3900)
Lymphocytes Relative: 31 %
MCH: 26.4 pg — ABNORMAL LOW (ref 27.0–33.0)
MCHC: 31.4 g/dL — ABNORMAL LOW (ref 32.0–36.0)
MCV: 84.1 fL (ref 80.0–100.0)
MONOS PCT: 11 %
MPV: 9.3 fL (ref 7.5–12.5)
Monocytes Absolute: 462 cells/uL (ref 200–950)
NEUTROS ABS: 2352 {cells}/uL (ref 1500–7800)
Neutrophils Relative %: 56 %
PLATELETS: 252 10*3/uL (ref 140–400)
RBC: 4.21 MIL/uL (ref 3.80–5.10)
RDW: 15 % (ref 11.0–15.0)
WBC: 4.2 10*3/uL (ref 3.8–10.8)

## 2017-05-27 LAB — COMPLETE METABOLIC PANEL WITH GFR
ALT: 7 U/L (ref 6–29)
AST: 14 U/L (ref 10–35)
Albumin: 3.6 g/dL (ref 3.6–5.1)
Alkaline Phosphatase: 68 U/L (ref 33–130)
BILIRUBIN TOTAL: 0.3 mg/dL (ref 0.2–1.2)
BUN: 15 mg/dL (ref 7–25)
CO2: 27 mmol/L (ref 20–31)
CREATININE: 0.93 mg/dL — AB (ref 0.60–0.88)
Calcium: 9.3 mg/dL (ref 8.6–10.4)
Chloride: 100 mmol/L (ref 98–110)
GFR, EST AFRICAN AMERICAN: 66 mL/min (ref >=60)
GFR, Est Non African American: 57 mL/min — ABNORMAL LOW (ref >=60)
Glucose, Bld: 73 mg/dL (ref 65–99)
Potassium: 3.8 mmol/L (ref 3.5–5.3)
Sodium: 135 mmol/L (ref 135–146)
TOTAL PROTEIN: 9.8 g/dL — AB (ref 6.1–8.1)

## 2017-05-27 LAB — IRON AND TIBC
%SAT: 15 % (ref 11–50)
IRON: 35 ug/dL — AB (ref 45–160)
TIBC: 238 ug/dL — AB (ref 250–450)
UIBC: 203 ug/dL

## 2017-05-27 LAB — TSH: TSH: 4.42 m[IU]/L

## 2017-05-27 MED ORDER — DENOSUMAB 60 MG/ML ~~LOC~~ SOLN
60.0000 mg | Freq: Once | SUBCUTANEOUS | Status: AC
  Administered 2017-05-27: 60 mg via SUBCUTANEOUS

## 2017-05-27 MED ORDER — DONEPEZIL HCL 5 MG PO TABS: 5.0000 mg | ORAL_TABLET | Freq: Every day | ORAL | 6 refills | Status: DC

## 2017-05-27 NOTE — Patient Instructions (Addendum)
Start Aricept(donepizil) for memory. Take 1 tab at bedtime  Continue current medications as ordered  Prolia injection given today  Will call with lab results  Will call with mammogram appt  iFOB for colon cancer screening  Follow up in 1 month for dementia  Keeping You Healthy  Get These Tests  Blood Pressure- Have your blood pressure checked by your healthcare provider at least once a year.  Normal blood pressure is 120/80.  Weight- Have your body mass index (BMI) calculated to screen for obesity.  BMI is a measure of body fat based on height and weight.  You can calculate your own BMI at [employer]  Cholesterol- Have your cholesterol checked every year.  Diabetes- Have your blood sugar checked every year if you have high blood pressure, high cholesterol, a family history of diabetes or if you are overweight.  Pap Test - Have a pap test every 1 to 5 years if you have been sexually active.  If you are older than 65 and recent pap tests have been normal you may not need additional pap tests.  In addition, if you have had a hysterectomy  for benign disease additional pap tests are not necessary.  Mammogram-Yearly mammograms are essential for early detection of breast cancer  Screening for Colon Cancer- Colonoscopy starting at age 64. Screening may begin sooner depending on your family history and other health conditions.  Follow up colonoscopy as directed by your Gastroenterologist.  Screening for Osteoporosis- Screening begins at age 62 with bone density scanning, sooner if you are at higher risk for developing Osteoporosis.  Get these medicines  Calcium with Vitamin D- Your body requires 1200-1500 mg of Calcium a day and [phone] IU of Vitamin D a day.  You can only absorb 500 mg of Calcium at a time therefore Calcium must be taken in 2 or 3 separate doses throughout the day.  Hormones- Hormone therapy has been associated with increased risk for certain cancers  and heart disease.  Talk to your healthcare provider about if you need relief from menopausal symptoms.  Aspirin- Ask your healthcare provider about taking Aspirin to prevent Heart Disease and Stroke.  Get these Immuniztions  Flu shot- Every fall  Pneumonia shot- Once after the age of 76; if you are younger ask your healthcare provider if you need a pneumonia shot.  Tetanus- Every ten years.  Zostavax- Once after the age of 34 to prevent shingles.  Take these steps  Don't smoke- Your healthcare provider can help you quit. For tips on how to quit, ask your healthcare provider or go to www.smokefree.gov or call 1-800 QUIT-NOW.  Be physically active- Exercise 5 days a week for a minimum of 30 minutes.  If you are not already physically active, start slow and gradually work up to 30 minutes of moderate physical activity.  Try walking, dancing, bike riding, swimming, etc.  Eat a healthy diet- Eat a variety of healthy foods such as fruits, vegetables, whole grains, low fat milk, low fat cheeses, yogurt, lean meats, chicken, fish, eggs, dried beans, tofu, etc.  For more information go to www.thenutritionsource.org  Dental visit- Brush and floss teeth twice daily; visit your dentist twice a year.  Eye exam- Visit your Optometrist or Ophthalmologist yearly.  Drink alcohol in moderation- Limit alcohol intake to one drink or less a day.  Never drink and drive.  Depression- Your emotional health is as important as your physical health.  If you're feeling down or losing interest in things  you normally enjoy, please talk to your healthcare provider.  Seat Belts- can save your life; always wear one  Smoke/Carbon Monoxide detectors- These detectors need to be installed on the appropriate level of your home.  Replace batteries at least once a year.  Violence- If anyone is threatening or hurting you, please tell your healthcare provider.  Living Will/ Health care power of attorney- Discuss with  your healthcare provider and family.

## 2017-05-27 NOTE — Progress Notes (Signed)
Patient ID: Jasmine Fletcher, female   DOB: 09/08/1931, 83 y.o.   MRN: [id number]   Location:  [facility]  Place of Service:  OFFICE  Provider: Arletha Grippe, DO  Patient Care Team: Gildardo Cranker, DO as PCP - General (Internal Medicine)  Extended Emergency Contact Information Primary Emergency Contact: Rella Larve [place] Phone: [phone] Work Phone: [phone] Relation: Other  Code Status:  Goals of Care: Advanced Directive information Advanced Directives 05/27/2017  Does Patient Have a Medical Advance Directive? Yes  Type of Advance Directive [place]  Does patient want to make changes to medical advance directive? No - Patient declined  Copy of [place] in Chart? No - copy requested  Would patient like information on creating a medical advance directive? -     Chief Complaint  Patient presents with  . Medical Management of Chronic Issues    Extended visit  . MMSE    24/30 failed clock drawing  . Prolia    Prolia injection    HPI: Patient is a 83 y.o. female seen in today for an annual wellness exam. She has not had a mammogram in some time.    She goes to the [facility] daily and volunteers in various projects. She got Prolia injection today.  Increased stressors due to sister, Enid Derry, with Alzheimer's dementia was recently dx with met breast CA at age 59. loss of sister from cancer (metastatic liver/lung/renal) at 44. She also notes several deaths in family over the last few mos. She feels depressed at times.  HTN - stable on lisinopril and HCTZ. No CP/SOB/palpitations. She takes ASA daily.   Hyperlipidemia - stable on simvastatin. She maintains low fat low cholesterol diet. No myalgias. LDL 75  Abnormal TSH - last level 3.79 (prev 6.32)   Depression screen [facility] 2/9 05/27/2017 03/14/2016 01/30/2015 07/25/2014 03/15/2013  Decreased Interest 0 0 0 1 0  Down, Depressed, Hopeless 0 0 0 1 1  PHQ - 2  Score 0 0 0 2 1  Altered sleeping - - - 0 -  Tired, decreased energy - - - 1 -  Change in appetite - - - 0 -  Feeling bad or failure about yourself  - - - 0 -  Trouble concentrating - - - 0 -  Moving slowly or fidgety/restless - - - 0 -  Suicidal thoughts - - - 0 -  PHQ-9 Score - - - 3 -    Fall Risk  05/27/2017 10/08/2016 03/14/2016 08/17/2015 01/30/2015  Falls in the past year? Yes No No No No  Number falls in past yr: 1 - - - -  Injury with Fall? No - - - -   MMSE - Mini Mental State Exam 05/27/2017 03/14/2016 07/25/2014  Not completed: - (No Data) -  Orientation to time _0 Orientation to Place _1 Registration _2 Attention/ Calculation 0 4 4  Recall _3 Language- name 2 objects _4 Language- repeat _5 Language- follow 3 step command _6 Language- read & follow direction _7 Write a sentence _8 Copy design 0 0 0  Total score _9 Health Maintenance  Topic Date Due  . INFLUENZA VACCINE  07/08/2017  . TETANUS/TDAP  10/15/2021  . DEXA SCAN  Completed  . PNA vac Low Risk Adult  Completed    Urinary incontinence? No concerns  Functional Status Survey: Is the patient deaf or have difficulty hearing?: No Does the patient have difficulty seeing, even when wearing glasses/contacts?: No Does the patient have difficulty concentrating, remembering, or making decisions?: No Does the patient have difficulty walking or climbing stairs?: No Does the patient have difficulty dressing or bathing?: No Does the patient have difficulty doing errands alone such as visiting a doctor's office or shopping?: Yes (requires assistance with transportation)  Exercise? Regular routine on Mon, Tues, Wed and Thursdays at [place]? Maintains healthy food choices   Visual Acuity Screening   Right eye Left eye Both eyes  Without correction:     With correction: 20/40  20/40  Comments: Patient was unable to read eye chart with left eye   Hearing: no  issues    Dentition: she does not have a regular dentist  Pain: controlled  Past Medical History:  Diagnosis Date  . Hyperlipidemia   . Hypertension   . Osteoporosis     Past Surgical History:  Procedure Laterality Date  . ABDOMINAL HYSTERECTOMY  1998    Family History  Problem Relation Age of Onset  . Cancer Father    Family Status  Relation Status  . Mother Deceased  . Father Deceased       Cause of Death: Cancer  . Sister Alive  . Daughter Alive  . Son Alive  . Sister Alive  . Sister Alive    shoulder  Social History   Social History  . Marital status: Single    Spouse name: N/A  . Number of children: N/A  . Years of education: N/A   Occupational History  . Not on file.   Social History Main Topics  . Smoking status: Former Smoker    Packs/day: 1.00  . Smokeless tobacco: Never Used  . Alcohol use No  . Drug use: No  . Sexual activity: Not on file   Other Topics Concern  . Not on file   Social History Narrative  . No narrative on file    No Known Allergies  Allergies as of 05/27/2017   No Known Allergies     Medication List       Accurate as of 05/27/17 10:33 AM. Always use your most recent med list.          aspirin 81 MG tablet Take 81 mg by mouth daily.   calcium-vitamin D 500-200 MG-UNIT tablet Commonly known as:  OSCAL 500/200 D-3 Take 1 tablet by mouth 2 (two) times daily.   carbamide peroxide 6.5 % OTIC solution Commonly known as:  DEBROX Place 5 drops into both ears 2 (two) times daily.   hydrochlorothiazide 25 MG tablet Commonly known as:  HYDRODIURIL take 1 tablet by mouth once daily   lisinopril 5 MG tablet Commonly known as:  PRINIVIL,ZESTRIL take 1 tablet by mouth once daily   PROLIA 60 MG/ML Soln injection Generic drug:  denosumab Inject 60 mg into the skin every 6 (six) months.   simvastatin 10 MG tablet Commonly known as:  ZOCOR take 1 tablet by mouth once daily        Review of Systems:    Review of Systems  Constitutional: Positive for fatigue.  HENT: Positive for hearing loss.   All other systems reviewed and are negative.   Physical Exam: Vitals:   05/27/17 0946  BP: (!) 144/72  Pulse: 64  Temp: 97.8 F (36.6 C)  TempSrc:  Oral  SpO2: 98%  Weight: 148 lb 3.2 oz (67.2 kg)  Height: _0  (1.702 m)   Body mass index is 23.21 kg/m. Physical Exam  Constitutional: She is oriented to person, place, and time. She appears well-developed and well-nourished. No distress.  HENT:  Head: Normocephalic and atraumatic.  Right Ear: External ear normal.  Left Ear: External ear normal.  Mouth/Throat: Oropharynx is clear and moist. No oropharyngeal exudate.  MMM; no oral thrush; b/l cerumen imapction  Eyes: EOM are normal. Pupils are equal, round, and reactive to light. No scleral icterus.  Neck: Normal range of motion. Neck supple. Carotid bruit is not present. No tracheal deviation present. No thyromegaly present.  Cardiovascular: Normal rate, regular rhythm, normal heart sounds and intact distal pulses.  Exam reveals no gallop and no friction rub.   No murmur heard. +1 pitting LE edema b/l. no calf TTP.   Pulmonary/Chest: Effort normal and breath sounds normal. No stridor. No respiratory distress. She has no wheezes. She has no rhonchi. She has no rales. She exhibits no tenderness. Right breast exhibits no inverted nipple, no mass, no nipple discharge, no skin change and no tenderness. Left breast exhibits no inverted nipple, no mass, no nipple discharge, no skin change and no tenderness. Breasts are symmetrical.  No rhonchi  Abdominal: Soft. Bowel sounds are normal. She exhibits no distension and no mass. There is no hepatosplenomegaly or hepatomegaly. There is no tenderness. There is no rebound and no guarding. No hernia.  Genitourinary:  Genitourinary Comments: Deferred due to age  Musculoskeletal: She exhibits deformity (thoracic kyphosis).  Lymphadenopathy:    She has  no cervical adenopathy.  Neurological: She is alert and oriented to person, place, and time. She has normal reflexes.  Skin: Skin is warm and dry. No rash noted.  Psychiatric: She has a normal mood and affect. Her behavior is normal. Thought content normal.  Vitals reviewed.   Labs reviewed:  Basic Metabolic Panel:  Recent Labs  10/08/16 1203 03/05/17 1042  NA 138 137  K 4.2 3.6  CL 102 101  CO2 25 29  GLUCOSE 75 103*  BUN 22 22*  CREATININE 1.05* 1.05*  CALCIUM 9.1 9.2  TSH 3.79  --    Liver Function Tests:  Recent Labs  10/08/16 1203 03/05/17 1051  AST  --  18  ALT 6 11*  ALKPHOS  --  52  BILITOT  --  0.2*  PROT  --  10.1*  ALBUMIN  --  3.2*   No results for input(s): LIPASE, AMYLASE in the last 8760 hours. No results for input(s): AMMONIA in the last 8760 hours. CBC:  Recent Labs  03/05/17 1042  WBC 4.3  HGB 10.8*  HCT 33.8*  MCV 84.3  PLT 229   Lipid Panel: No results for input(s): CHOL, HDL, LDLCALC, TRIG, CHOLHDL, LDLDIRECT in the last 8760 hours. No results found for: HGBA1C  Procedures: No results found.  Assessment/Plan   ICD-10-CM   1. Initial Medicare annual wellness visit Z00.00   2. HYPERTENSION, BENIGN ESSENTIAL I10 CMP with eGFR    CBC with Differential/Platelets  3. Abnormal TSH R94.6 TSH  4. Mixed hyperlipidemia E78.2 Lipid Panel  5. Senile osteoporosis M81.0 denosumab (PROLIA) injection 60 mg  6. Iron deficiency anemia, unspecified iron deficiency anemia type D50.9 CBC with Differential/Platelets    Ferritin    Iron and TIBC  7. High risk medication use Z79.899 CMP with eGFR  8. Bilateral impacted cerumen H61.23   9. Dementia  without behavioral disturbance, unspecified dementia type F03.90 donepezil (ARICEPT) 5 MG tablet  10. Estrogen deficiency E28.39 DG Bone Density  11. Breast cancer screening Z12.31 MM Digital Screening    B/l ear lavage done today - left external ear canal with hard wax. Start OTC debrox gtts as  directed  Start Aricept(donepizil) for memory. Take 1 tab at bedtime  Continue current medications as ordered  Prolia injection given today  Will call with lab results  Will call with mammogram appt  iFOB for colon cancer screening  Follow up in 1 month for dementia  Keeping You Healthy - handout given  Cordella Register. Perlie Gold  [facility] [address] [place], [place] [address] [phone] Cell (Monday-Friday 8 AM - 5 PM) [phone] After 5 PM and follow prompts

## 2017-05-28 LAB — FERRITIN: FERRITIN: 144 ng/mL (ref 20–288)

## 2017-05-31 ENCOUNTER — Encounter: Payer: Self-pay | Admitting: Internal Medicine

## 2017-05-31 DIAGNOSIS — Z1211 Encounter for screening for malignant neoplasm of colon: Secondary | ICD-10-CM | POA: Diagnosis not present

## 2017-06-17 ENCOUNTER — Other Ambulatory Visit: Payer: Self-pay | Admitting: Internal Medicine

## 2017-06-18 ENCOUNTER — Ambulatory Visit
Admission: RE | Admit: 2017-06-18 | Discharge: 2017-06-18 | Disposition: A | Discharge status: Home / Self Care | Payer: Medicare Other | Source: Ambulatory Visit | Attending: Internal Medicine | Admitting: Internal Medicine

## 2017-06-18 DIAGNOSIS — Z1239 Encounter for other screening for malignant neoplasm of breast: Secondary | ICD-10-CM

## 2017-06-18 DIAGNOSIS — E2839 Other primary ovarian failure: Secondary | ICD-10-CM

## 2017-06-18 DIAGNOSIS — Z1231 Encounter for screening mammogram for malignant neoplasm of breast: Secondary | ICD-10-CM | POA: Diagnosis not present

## 2017-06-18 DIAGNOSIS — Z780 Asymptomatic menopausal state: Secondary | ICD-10-CM | POA: Diagnosis not present

## 2017-06-18 DIAGNOSIS — M810 Age-related osteoporosis without current pathological fracture: Secondary | ICD-10-CM | POA: Diagnosis not present

## 2017-07-01 ENCOUNTER — Encounter: Payer: Self-pay | Admitting: Internal Medicine

## 2017-07-01 ENCOUNTER — Ambulatory Visit (INDEPENDENT_AMBULATORY_CARE_PROVIDER_SITE_OTHER): Payer: Medicare Other | Admitting: Internal Medicine

## 2017-07-01 VITALS — BP 132/70 | HR 55 | Temp 97.6°F | Ht 67.0 in | Wt 145.2 lb

## 2017-07-01 DIAGNOSIS — F0390 Unspecified dementia without behavioral disturbance: Secondary | ICD-10-CM | POA: Insufficient documentation

## 2017-07-01 DIAGNOSIS — Z79899 Other long term (current) drug therapy: Secondary | ICD-10-CM | POA: Diagnosis not present

## 2017-07-01 DIAGNOSIS — E782 Mixed hyperlipidemia: Secondary | ICD-10-CM

## 2017-07-01 NOTE — Progress Notes (Signed)
Patient ID: Jasmine Fletcher, female   DOB: [birth year], 83 y.o.   MRN: [id number]    Location:  [facility] Place of Service: OFFICE  Chief Complaint  Patient presents with  . Dementia    1 month Follow up for Dementia    HPI:  83 yo female seen today for f/u dementia/ she was started on aricept 58m qhs at last OV. She noticed improvement in cognitive function - less forgetful more detail oriented. She can participate in conversations better. No nightmares or diarrhea. She is tolerating medication well. She scored MMSE 24/30 in June 2018. She does not drive but catches county transportation to her appts. She is a poor historian due to dementia. Hx obtained from chart  Past Medical History:  Diagnosis Date  . Hyperlipidemia   . Hypertension   . Osteoporosis     Past Surgical History:  Procedure Laterality Date  . ABDOMINAL HYSTERECTOMY  1998    Patient Care Team: CGildardo Cranker DO as PCP - General (Internal Medicine)  Social History   Social History  . Marital status: Single    Spouse name: N/A  . Number of children: N/A  . Years of education: N/A   Occupational History  . Not on file.   Social History Main Topics  . Smoking status: Former Smoker    Packs/day: 1.00  . Smokeless tobacco: Never Used  . Alcohol use No  . Drug use: No  . Sexual activity: Not on file   Other Topics Concern  . Not on file   Social History Narrative  . No narrative on file     reports that she has quit smoking. She smoked 1.00 pack per day. She has never used smokeless tobacco. She reports that she does not drink alcohol or use drugs.  Family History  Problem Relation Age of Onset  . Cancer Father   . Breast cancer Maternal Grandmother   . Breast cancer Paternal Grandmother    Family Status  Relation Status  . Mother Deceased  . Father Deceased       Cause of Death: Cancer  . Sister Alive  . Daughter Alive  . Son Alive  . Sister Alive  . Sister Alive  . MGM (Not Specified)  .  PGM (Not Specified)     No Known Allergies  Medications: Patient's Medications  New Prescriptions   No medications on file  Previous Medications   ASPIRIN 81 MG TABLET    Take 81 mg by mouth daily.   CALCIUM-VITAMIN D (OSCAL 500/200 D-3) 500-200 MG-UNIT PER TABLET    Take 1 tablet by mouth 2 (two) times daily.   CARBAMIDE PEROXIDE (DEBROX) 6.5 % OTIC SOLUTION    Place 5 drops into both ears 2 (two) times daily.   DONEPEZIL (ARICEPT) 5 MG TABLET    Take 1 tablet (5 mg total) by mouth at bedtime.   HYDROCHLOROTHIAZIDE (HYDRODIURIL) 25 MG TABLET    take 1 tablet by mouth once daily   LISINOPRIL (PRINIVIL,ZESTRIL) 5 MG TABLET    take 1 tablet by mouth once daily   PROLIA 60 MG/ML SOLN INJECTION    Inject 60 mg into the skin every 6 (six) months.   SIMVASTATIN (ZOCOR) 10 MG TABLET    take 1 tablet by mouth once daily  Modified Medications   No medications on file  Discontinued Medications   No medications on file    Review of Systems  Unable to perform ROS: Dementia    Vitals:  07/01/17 1033  BP: 132/70  Pulse: (!) 55  Temp: 97.6 F (36.4 C)  TempSrc: Oral  SpO2: 98%  Weight: 145 lb 3.2 oz (65.9 kg)  Height: 5' 7" (1.702 m)   Body mass index is 22.74 kg/m.  Physical Exam  Constitutional: She appears well-developed and well-nourished. No distress.  Cardiovascular: Exam reveals no gallop and no friction rub.   Murmur (1/6 SEM) heard. Trace LE edema b/l. No calf TTP  Pulmonary/Chest: Effort normal and breath sounds normal. No respiratory distress. She has no wheezes. She has no rales.  Musculoskeletal: She exhibits edema.  Neurological: She is alert.  Skin: Skin is warm and dry. No rash noted.  Psychiatric: She has a normal mood and affect. Her behavior is normal. Thought content normal.     Labs reviewed: Office Visit on 05/27/2017  Component Date Value Ref Range Status  . Sodium 05/27/2017 135  135 - 146 mmol/L Final  . Potassium 05/27/2017 3.8  3.5 - 5.3 mmol/L  Final  . Chloride 05/27/2017 100  98 - 110 mmol/L Final  . CO2 05/27/2017 27  20 - 31 mmol/L Final  . Glucose, Bld 05/27/2017 73  65 - 99 mg/dL Final  . BUN 05/27/2017 15  7 - 25 mg/dL Final  . Creat 05/27/2017 0.93* 0.60 - 0.88 mg/dL Final   Comment:   For patients > or = 81 years of age: The upper reference limit for Creatinine is approximately 13% higher for people identified as African-American.     . Total Bilirubin 05/27/2017 0.3  0.2 - 1.2 mg/dL Final  . Alkaline Phosphatase 05/27/2017 68  33 - 130 U/L Final  . AST 05/27/2017 14  10 - 35 U/L Final  . ALT 05/27/2017 7  6 - 29 U/L Final  . Total Protein 05/27/2017 9.8* 6.1 - 8.1 g/dL Final  . Albumin 05/27/2017 3.6  3.6 - 5.1 g/dL Final  . Calcium 05/27/2017 9.3  8.6 - 10.4 mg/dL Final  . GFR, Est African American 05/27/2017 66  >=60 mL/min Final  . GFR, Est Non African American 05/27/2017 57* >=60 mL/min Final  . Cholesterol 05/27/2017 178  <200 mg/dL Final  . Triglycerides 05/27/2017 123  <150 mg/dL Final  . HDL 05/27/2017 44* >50 mg/dL Final  . Total CHOL/HDL Ratio 05/27/2017 4.0  <5.0 Ratio Final  . VLDL 05/27/2017 25  <30 mg/dL Final  . LDL Cholesterol 05/27/2017 109* <100 mg/dL Final  . WBC 05/27/2017 4.2  3.8 - 10.8 K/uL Final  . RBC 05/27/2017 4.21  3.80 - 5.10 MIL/uL Final  . Hemoglobin 05/27/2017 11.1* 11.7 - 15.5 g/dL Final  . HCT 05/27/2017 35.4  35.0 - 45.0 % Final  . MCV 05/27/2017 84.1  80.0 - 100.0 fL Final  . MCH 05/27/2017 26.4* 27.0 - 33.0 pg Final  . MCHC 05/27/2017 31.4* 32.0 - 36.0 g/dL Final  . RDW 05/27/2017 15.0  11.0 - 15.0 % Final  . Platelets 05/27/2017 252  140 - 400 K/uL Final  . MPV 05/27/2017 9.3  7.5 - 12.5 fL Final  . Neutro Abs 05/27/2017 2352  1,500 - 7,800 cells/uL Final  . Lymphs Abs 05/27/2017 1302  850 - 3,900 cells/uL Final  . Monocytes Absolute 05/27/2017 462  200 - 950 cells/uL Final  . Eosinophils Absolute 05/27/2017 42  15 - 500 cells/uL Final  . Basophils Absolute 05/27/2017  42  0 - 200 cells/uL Final  . Neutrophils Relative % 05/27/2017 56  % Final  . Lymphocytes Relative  05/27/2017 31  % Final  . Monocytes Relative 05/27/2017 11  % Final  . Eosinophils Relative 05/27/2017 1  % Final  . Basophils Relative 05/27/2017 1  % Final  . Smear Review 05/27/2017 Criteria for review not met   Final  . TSH 05/27/2017 4.42  mIU/L Final   Comment:   Reference Range   > or = 20 Years  0.40-4.50   Pregnancy Range First trimester  0.26-2.66 Second trimester 0.55-2.73 Third trimester  0.43-2.91     . Ferritin 05/27/2017 144  20 - 288 ng/mL Final  . Iron 05/27/2017 35* 45 - 160 ug/dL Final  . UIBC 05/27/2017 203  ug/dL Final  . TIBC 05/27/2017 238* 250 - 450 ug/dL Final  . %SAT 05/27/2017 15  11 - 50 % Final    Dg Bone Density  Result Date: 06/18/2017 EXAM: DUAL X-RAY ABSORPTIOMETRY (DXA) FOR BONE MINERAL DENSITY IMPRESSION: Referring Physician:  Eulas Post, Jaylah Goodlow PATIENT: Name: Jasmine, Fletcher Patient ID: [id number] Birth Date: [birth year] Height: 66.5 in. Sex: Female Measured: 06/18/2017 Weight: 143.0 lbs. Indications: Advanced Age, Estrogen Deficient, Family History of Osteoporosis, Height Loss (781.91), Hx of tobacco use, multiple broken bones, Postmenopausal Fractures: Ankle, Knee Treatments: Calcium (E943.0), Prolia, Vitamin D (E933.5) ASSESSMENT: The BMD measured at Forearm Radius 33% is 0.558 g/cm2 with a T-score of -3.7. This patient is considered OSTEOPOROTIC according to [facility]) criteria. Lumbar spine was not utilized due to scoliosis. There has been no statistically significant change in BMD of the left hip since prior exam dated 04/21/2014. Patient does not meet criteria for FRAX assessment. Site Region Measured Date Measured Age YA T-score BMD Significant CHANGE Left Forearm Radius 33% 06/18/2017 83.9 -3.7 0.558 g/cm2 DualFemur Total Left 06/18/2017 83.9 -2.5 0.697 g/cm2 [employer] [facility]) criteria for post-menopausal, Caucasian  Women: Normal       T-score at or above -1 SD Osteopenia   T-score between -1 and -2.5 SD Osteoporosis T-score at or below -2.5 SD RECOMMENDATION: [place] recommends that FDA-approved medical therapies be considered in postmenopausal women and men age 65 or older with a: 1. Hip or vertebral (clinical or morphometric) fracture. 2. T-score of <-2.5 at the spine or hip. 3. Ten-year fracture probability by FRAX of 3% or greater for hip fracture or 20% or greater for major osteoporotic fracture. All treatment decisions require clinical judgment and consideration of individual patient factors, including patient preferences, co-morbidities, previous drug use, risk factors not captured in the FRAX model (e.g. falls, vitamin D deficiency, increased bone turnover, interval significant decline in bone density) and possible under - or over-estimation of fracture risk by FRAX. All patients should ensure an adequate intake of dietary calcium (1200 mg/d) and vitamin D (800 IU daily) unless contraindicated. FOLLOW-UP: People with diagnosed cases of osteoporosis or at high risk for fracture should have regular bone mineral density tests. For patients eligible for Medicare, routine testing is allowed once every 2 years. The testing frequency can be increased to one year for patients who have rapidly progressing disease, those who are receiving or discontinuing medical therapy to restore bone mass, or have additional risk factors. I have reviewed this report, and agree with the above findings. Mark A. Thornton Papas, M.D. [facility] Radiology Electronically Signed   By: Lavonia Dana M.D.   On: 06/18/2017 11:45   Mm Digital Screening  Result Date: 06/19/2017 CLINICAL DATA:  Screening. EXAM: DIGITAL SCREENING BILATERAL MAMMOGRAM WITH CAD COMPARISON:  Previous exam(s). ACR Breast Density Category b:  There are scattered areas of fibroglandular density. FINDINGS: There are no findings suspicious for malignancy. Images  were processed with CAD. IMPRESSION: No mammographic evidence of malignancy. A result letter of this screening mammogram will be mailed directly to the patient. RECOMMENDATION: Screening mammogram in one year. (Code:[id number]-01Y) BI-RADS CATEGORY  1: Negative. Electronically Signed   By: Fidela Salisbury M.D.   On: 06/19/2017 10:53     Assessment/Plan   ICD-10-CM   1. Dementia without behavioral disturbance, unspecified dementia type F03.90   2. Mixed hyperlipidemia E78.2 Lipid Panel  3. High risk medication use Z79.899 CMP with eGFR   Continue aricept as ordered  Continue other medications as ordered  Follow up in 3 mos for dementia, HTN, hyperlipidemia. Fasting labs prior to appt  [place] S. Perlie Gold  [facility] and Adult Medicine [address] [place], [place] [address] [phone] Cell (Monday-Friday 8 AM - 5 PM) [phone] After 5 PM and follow prompts

## 2017-07-01 NOTE — Patient Instructions (Addendum)
Continue aricept as ordered  Continue other medications as ordered  Follow up in 3 mos for dementia, HTN, hyperlipidemia. Fasting labs prior to appt

## 2017-09-30 ENCOUNTER — Other Ambulatory Visit: Payer: Medicare Other

## 2017-09-30 DIAGNOSIS — Z79899 Other long term (current) drug therapy: Secondary | ICD-10-CM

## 2017-09-30 DIAGNOSIS — E782 Mixed hyperlipidemia: Secondary | ICD-10-CM

## 2017-09-30 LAB — COMPLETE METABOLIC PANEL WITH GFR
AG Ratio: 0.6 (calc) — ABNORMAL LOW (ref 1.0–2.5)
ALBUMIN MSPROF: 3.3 g/dL — AB (ref 3.6–5.1)
ALKALINE PHOSPHATASE (APISO): 52 U/L (ref 33–130)
ALT: 7 U/L (ref 6–29)
AST: 13 U/L (ref 10–35)
BUN / CREAT RATIO: 15 (calc) (ref 6–22)
BUN: 15 mg/dL (ref 7–25)
CALCIUM: 9 mg/dL (ref 8.6–10.4)
CO2: 29 mmol/L (ref 20–32)
CREATININE: 0.98 mg/dL — AB (ref 0.60–0.88)
Chloride: 100 mmol/L (ref 98–110)
GFR, EST NON AFRICAN AMERICAN: 53 mL/min/{1.73_m2} — AB (ref >=60)
GFR, Est African American: 61 mL/min/{1.73_m2} (ref >=60)
GLUCOSE: 75 mg/dL (ref 65–139)
Globulin: 6 g/dL (calc) — ABNORMAL HIGH (ref 1.9–3.7)
Potassium: 4.1 mmol/L (ref 3.5–5.3)
Sodium: 135 mmol/L (ref 135–146)
Total Bilirubin: 0.3 mg/dL (ref 0.2–1.2)
Total Protein: 9.3 g/dL — ABNORMAL HIGH (ref 6.1–8.1)

## 2017-09-30 LAB — LIPID PANEL
CHOLESTEROL: 181 mg/dL (ref <200)
HDL: 51 mg/dL (ref >50)
LDL Cholesterol (Calc): 110 mg/dL (calc) — ABNORMAL HIGH
Non-HDL Cholesterol (Calc): 130 mg/dL (calc) — ABNORMAL HIGH (ref <130)
Total CHOL/HDL Ratio: 3.5 (calc) (ref <5.0)
Triglycerides: 98 mg/dL (ref <150)

## 2017-10-02 ENCOUNTER — Encounter: Payer: Self-pay | Admitting: Internal Medicine

## 2017-10-02 ENCOUNTER — Ambulatory Visit (INDEPENDENT_AMBULATORY_CARE_PROVIDER_SITE_OTHER): Payer: Medicare Other | Admitting: Internal Medicine

## 2017-10-02 VITALS — BP 140/78 | HR 61 | Temp 97.7°F | Ht 67.0 in | Wt 143.0 lb

## 2017-10-02 DIAGNOSIS — D509 Iron deficiency anemia, unspecified: Secondary | ICD-10-CM

## 2017-10-02 DIAGNOSIS — E8809 Other disorders of plasma-protein metabolism, not elsewhere classified: Secondary | ICD-10-CM

## 2017-10-02 DIAGNOSIS — Z79899 Other long term (current) drug therapy: Secondary | ICD-10-CM | POA: Diagnosis not present

## 2017-10-02 DIAGNOSIS — I10 Essential (primary) hypertension: Secondary | ICD-10-CM | POA: Diagnosis not present

## 2017-10-02 DIAGNOSIS — E782 Mixed hyperlipidemia: Secondary | ICD-10-CM

## 2017-10-02 DIAGNOSIS — F0390 Unspecified dementia without behavioral disturbance: Secondary | ICD-10-CM | POA: Diagnosis not present

## 2017-10-02 DIAGNOSIS — Z23 Encounter for immunization: Secondary | ICD-10-CM | POA: Diagnosis not present

## 2017-10-02 NOTE — Patient Instructions (Addendum)
Flu shot given today  Continue current medications as ordered  Contact [facility] and give them [employer] information to have lab covered  Follow up in 4 mos for hyperlipidemia, HTN, osteoporosis. Fasting labs prior to appt

## 2017-10-02 NOTE — Progress Notes (Signed)
Patient ID: Jasmine Fletcher, female   DOB: 27-Nov-1931, 83 y.o.   MRN: [id number]    Location:  [facility] Place of Service: OFFICE  Chief Complaint  Patient presents with  . Medical Management of Chronic Issues    3 month follow-p on dementia, HTN, and Hyperlipidemia. Discuss labs (copy printed)   . Immunizations    Flu Vaccine   . Medication Refill  . Health Maintenance    HPI:  83 yo female seen today for f/u. She has not been eating well because she does not like to eat alone. She is c/a bill she rec'd from [employer] regarding unpd claim. She mailed back her iFOB to lab and did not bring it to the office. She is a poor historian due to dementia. Hx obtained from chart. Labs reviewed with pt  She continues to go to the [facility] daily and volunteers in various projects.  Depression - stable. Increased stressors due to sister, Enid Derry, with Alzheimer's dementia was dx with met breast CA at age 14. She lost another sister from cancer (metastatic liver/lung/renal) at 45. She also notes several deaths in family over the last few mos. She feels depressed at times.  HTN - stable on lisinopril and HCTZ. No CP/SOB/palpitations. She takes ASA daily.   Hyperlipidemia - stable on simvastatin. She maintains low fat low cholesterol diet. No myalgias. LDL 110  Abnormal TSH - last level 3.79 (prev 6.32)  Osteoporosis - stable. she gets Prolia injections since Aug 2015  Dementia - improved on aricept. No nightmares or diarrhea. MMSE 24/30 in June 2018. Weight down 5 lbs.  Albumin 3.3  Past Medical History:  Diagnosis Date  . Hyperlipidemia   . Hypertension   . Osteoporosis     Past Surgical History:  Procedure Laterality Date  . ABDOMINAL HYSTERECTOMY  1998    Patient Care Team: Gildardo Cranker, DO as PCP - General (Internal Medicine)  Social History   Social History  . Marital status: Single    Spouse name: N/A  . Number of children: N/A  . Years of education: N/A    Occupational History  . Not on file.   Social History Main Topics  . Smoking status: Former Smoker    Packs/day: 1.00    Quit date: 12/08/2006  . Smokeless tobacco: Never Used     Comment: Quit t age 12   . Alcohol use No  . Drug use: No  . Sexual activity: Not on file   Other Topics Concern  . Not on file   Social History Narrative  . No narrative on file     reports that she quit smoking about 10 years ago. She smoked 1.00 pack per day. She has never used smokeless tobacco. She reports that she does not drink alcohol or use drugs.  Family History  Problem Relation Age of Onset  . Cancer Father   . Dementia Sister   . Breast cancer Maternal Grandmother   . Breast cancer Paternal Grandmother    Family Status  Relation Status  . Mother Deceased  . Father Deceased       Cause of Death: Cancer  . Sister Alive  . Daughter Alive  . Son Alive  . Sister Deceased  . Sister Alive  . MGM (Not Specified)  . PGM (Not Specified)     No Known Allergies  Medications: Patient's Medications  New Prescriptions   No medications on file  Previous Medications   ASPIRIN 81 MG TABLET  Take 81 mg by mouth daily.   CALCIUM-VITAMIN D (OSCAL 500/200 D-3) 500-200 MG-UNIT PER TABLET    Take 1 tablet by mouth 2 (two) times daily.   DONEPEZIL (ARICEPT) 5 MG TABLET    Take 1 tablet (5 mg total) by mouth at bedtime.   HYDROCHLOROTHIAZIDE (HYDRODIURIL) 25 MG TABLET    take 1 tablet by mouth once daily   LISINOPRIL (PRINIVIL,ZESTRIL) 5 MG TABLET    take 1 tablet by mouth once daily   PROLIA 60 MG/ML SOLN INJECTION    Inject 60 mg into the skin every 6 (six) months.   SIMVASTATIN (ZOCOR) 10 MG TABLET    take 1 tablet by mouth once daily  Modified Medications   No medications on file  Discontinued Medications   CARBAMIDE PEROXIDE (DEBROX) 6.5 % OTIC SOLUTION    Place 5 drops into both ears 2 (two) times daily.    Review of Systems  Unable to perform ROS: Dementia (memory loss)     Vitals:   10/02/17 1121  BP: 140/78  Pulse: 61  Temp: 97.7 F (36.5 C)  TempSrc: Oral  SpO2: 97%  Weight: 143 lb (64.9 kg)  Height: _0  (1.702 m)   Body mass index is 22.4 kg/m.  Physical Exam  Constitutional: She appears well-developed and well-nourished.  HENT:  Mouth/Throat: Oropharynx is clear and moist. No oropharyngeal exudate.  MMM; no oral thrush  Eyes: Pupils are equal, round, and reactive to light. No scleral icterus.  Neck: Neck supple. Carotid bruit is not present. No tracheal deviation present. No thyromegaly present.  Cardiovascular: Normal rate, regular rhythm and intact distal pulses.  Exam reveals no gallop and no friction rub.   Murmur (1/6 SEM) heard. No LE edema b/l. no calf TTP.   Pulmonary/Chest: Effort normal and breath sounds normal. No stridor. No respiratory distress. She has no wheezes. She has no rales.  Abdominal: Soft. Normal appearance and bowel sounds are normal. She exhibits no distension and no mass. There is no hepatomegaly. There is no tenderness. There is no rigidity, no rebound and no guarding. No hernia.  Musculoskeletal: She exhibits edema (R>L ankle).  Lymphadenopathy:    She has no cervical adenopathy.  Neurological: She is alert.  Skin: Skin is warm and dry. No rash noted.  Psychiatric: She has a normal mood and affect. Her behavior is normal. Cognition and memory are impaired.     Labs reviewed: Appointment on 09/30/2017  Component Date Value Ref Range Status  . Glucose, Bld 09/30/2017 75  65 - 139 mg/dL Final   Comment: .        Non-fasting reference interval .   . BUN 09/30/2017 15  7 - 25 mg/dL Final  . Creat 09/30/2017 0.98* 0.60 - 0.88 mg/dL Final   Comment: For patients >66 years of age, the reference limit for Creatinine is approximately 13% higher for people identified as African-American. .   . GFR, Est Non African American 09/30/2017 53* > OR = 60 mL/min/1.66m Final  . GFR, Est African American  09/30/2017 61  > OR = 60 mL/min/1.775mFinal  . BUN/Creatinine Ratio 09/30/2017 15  6 - 22 (calc) Final  . Sodium 09/30/2017 135  135 - 146 mmol/L Final  . Potassium 09/30/2017 4.1  3.5 - 5.3 mmol/L Final  . Chloride 09/30/2017 100  98 - 110 mmol/L Final  . CO2 09/30/2017 29  20 - 32 mmol/L Final  . Calcium 09/30/2017 9.0  8.6 - 10.4 mg/dL Final  .  Total Protein 09/30/2017 9.3* 6.1 - 8.1 g/dL Final  . Albumin 09/30/2017 3.3* 3.6 - 5.1 g/dL Final  . Globulin 09/30/2017 6.0* 1.9 - 3.7 g/dL (calc) Final  . AG Ratio 09/30/2017 0.6* 1.0 - 2.5 (calc) Final  . Total Bilirubin 09/30/2017 0.3  0.2 - 1.2 mg/dL Final  . Alkaline phosphatase (APISO) 09/30/2017 52  33 - 130 U/L Final  . AST 09/30/2017 13  10 - 35 U/L Final  . ALT 09/30/2017 7  6 - 29 U/L Final  . Cholesterol 09/30/2017 181  <200 mg/dL Final  . HDL 09/30/2017 51  >50 mg/dL Final  . Triglycerides 09/30/2017 98  <150 mg/dL Final  . LDL Cholesterol (Calc) 09/30/2017 110* mg/dL (calc) Final   Comment: Reference range: <100 . Desirable range <100 mg/dL for primary prevention;   <70 mg/dL for patients with CHD or diabetic patients  with > or = 2 CHD risk factors. Marland Kitchen LDL-C is now calculated using the Martin-Hopkins  calculation, which is a validated novel method providing  better accuracy than the Friedewald equation in the  estimation of LDL-C.  Cresenciano Genre et al. Annamaria Helling. 1102;111(73): 2061-2068  (http://education.[facility].com/faq/FAQ164)   . Total CHOL/HDL Ratio 09/30/2017 3.5  <5.0 (calc) Final  . Non-HDL Cholesterol (Calc) 09/30/2017 130* <130 mg/dL (calc) Final   Comment: For patients with diabetes plus 1 major ASCVD risk  factor, treating to a non-HDL-C goal of <100 mg/dL  (LDL-C of <70 mg/dL) is considered a therapeutic  option.     No results found.   Assessment/Plan   ICD-10-CM   1. Mixed hyperlipidemia E78.2 Lipid Panel  2. Need for immunization against influenza Z23 Flu Vaccine QUAD 6+ mos PF IM (Fluarix Quad  PF)  3. Dementia without behavioral disturbance, unspecified dementia type F03.90   4. High risk medication use Z79.899 CMP with eGFR  5. HYPERTENSION, BENIGN ESSENTIAL I10   6. Hypoalbuminemia E88.09 CMP with eGFR  7. Iron deficiency anemia, unspecified iron deficiency anemia type D50.9 CBC (no diff)    S/w lab tech - pt will need to contact [facility] with her [facility] Medicare # to get claim paid  Flu shot given today  Continue current medications as ordered  Follow up in 4 mos for hyperlipidemia, HTN, osteoporosis. Fasting labs prior to appt  [place] S. Perlie Gold  [facility] and Adult Medicine [address] [place], [place] [address] [phone] Cell (Monday-Friday 8 AM - 5 PM) [phone] After 5 PM and follow prompts

## 2017-11-27 ENCOUNTER — Ambulatory Visit (INDEPENDENT_AMBULATORY_CARE_PROVIDER_SITE_OTHER): Payer: Medicare Other | Admitting: *Deleted

## 2017-11-27 DIAGNOSIS — M810 Age-related osteoporosis without current pathological fracture: Secondary | ICD-10-CM | POA: Diagnosis not present

## 2017-11-27 MED ORDER — DENOSUMAB 60 MG/ML ~~LOC~~ SOLN
60.0000 mg | Freq: Once | SUBCUTANEOUS | Status: AC
Start: 2017-11-27 — End: 2017-11-27
  Administered 2017-11-27: 60 mg via SUBCUTANEOUS

## 2017-12-30 ENCOUNTER — Other Ambulatory Visit: Payer: Self-pay | Admitting: Internal Medicine

## 2017-12-30 DIAGNOSIS — F0390 Unspecified dementia without behavioral disturbance: Secondary | ICD-10-CM

## 2018-01-17 ENCOUNTER — Other Ambulatory Visit: Payer: Self-pay | Admitting: Internal Medicine

## 2018-01-25 ENCOUNTER — Other Ambulatory Visit: Payer: Medicare Other

## 2018-01-25 DIAGNOSIS — Z79899 Other long term (current) drug therapy: Secondary | ICD-10-CM

## 2018-01-25 DIAGNOSIS — E8809 Other disorders of plasma-protein metabolism, not elsewhere classified: Secondary | ICD-10-CM

## 2018-01-25 DIAGNOSIS — E782 Mixed hyperlipidemia: Secondary | ICD-10-CM

## 2018-01-25 DIAGNOSIS — D509 Iron deficiency anemia, unspecified: Secondary | ICD-10-CM

## 2018-01-25 LAB — CBC
HEMATOCRIT: 34.7 % — AB (ref 35.0–45.0)
Hemoglobin: 11.1 g/dL — ABNORMAL LOW (ref 11.7–15.5)
MCH: 26.5 pg — AB (ref 27.0–33.0)
MCHC: 32 g/dL (ref 32.0–36.0)
MCV: 82.8 fL (ref 80.0–100.0)
MPV: 10.2 fL (ref 7.5–12.5)
Platelets: 267 10*3/uL (ref 140–400)
RBC: 4.19 10*6/uL (ref 3.80–5.10)
RDW: 13.9 % (ref 11.0–15.0)
WBC: 4.4 10*3/uL (ref 3.8–10.8)

## 2018-01-25 LAB — LIPID PANEL
Cholesterol: 204 mg/dL — ABNORMAL HIGH (ref <200)
HDL: 50 mg/dL — AB (ref >50)
LDL Cholesterol (Calc): 131 mg/dL (calc) — ABNORMAL HIGH
NON-HDL CHOLESTEROL (CALC): 154 mg/dL — AB (ref <130)
Total CHOL/HDL Ratio: 4.1 (calc) (ref <5.0)
Triglycerides: 118 mg/dL (ref <150)

## 2018-01-25 LAB — COMPLETE METABOLIC PANEL WITH GFR
AG RATIO: 0.6 (calc) — AB (ref 1.0–2.5)
ALT: 8 U/L (ref 6–29)
AST: 16 U/L (ref 10–35)
Albumin: 3.6 g/dL (ref 3.6–5.1)
Alkaline phosphatase (APISO): 54 U/L (ref 33–130)
BILIRUBIN TOTAL: 0.2 mg/dL (ref 0.2–1.2)
BUN/Creatinine Ratio: 20 (calc) (ref 6–22)
BUN: 20 mg/dL (ref 7–25)
CO2: 29 mmol/L (ref 20–32)
Calcium: 9.4 mg/dL (ref 8.6–10.4)
Chloride: 101 mmol/L (ref 98–110)
Creat: 0.99 mg/dL — ABNORMAL HIGH (ref 0.60–0.88)
GFR, EST AFRICAN AMERICAN: 61 mL/min/{1.73_m2} (ref >=60)
GFR, EST NON AFRICAN AMERICAN: 52 mL/min/{1.73_m2} — AB (ref >=60)
GLOBULIN: 6.3 g/dL — AB (ref 1.9–3.7)
Glucose, Bld: 86 mg/dL (ref 65–99)
POTASSIUM: 4.5 mmol/L (ref 3.5–5.3)
SODIUM: 135 mmol/L (ref 135–146)
Total Protein: 9.9 g/dL — ABNORMAL HIGH (ref 6.1–8.1)

## 2018-01-27 ENCOUNTER — Encounter: Payer: Self-pay | Admitting: Internal Medicine

## 2018-01-27 ENCOUNTER — Ambulatory Visit (INDEPENDENT_AMBULATORY_CARE_PROVIDER_SITE_OTHER): Payer: Medicare Other | Admitting: Internal Medicine

## 2018-01-27 VITALS — BP 128/80 | HR 54 | Temp 97.9°F | Ht 67.0 in | Wt 150.0 lb

## 2018-01-27 DIAGNOSIS — R7989 Other specified abnormal findings of blood chemistry: Secondary | ICD-10-CM | POA: Diagnosis not present

## 2018-01-27 DIAGNOSIS — I10 Essential (primary) hypertension: Secondary | ICD-10-CM | POA: Diagnosis not present

## 2018-01-27 DIAGNOSIS — E782 Mixed hyperlipidemia: Secondary | ICD-10-CM

## 2018-01-27 DIAGNOSIS — D509 Iron deficiency anemia, unspecified: Secondary | ICD-10-CM | POA: Diagnosis not present

## 2018-01-27 DIAGNOSIS — Z79899 Other long term (current) drug therapy: Secondary | ICD-10-CM | POA: Diagnosis not present

## 2018-01-27 DIAGNOSIS — F0390 Unspecified dementia without behavioral disturbance: Secondary | ICD-10-CM | POA: Diagnosis not present

## 2018-01-27 DIAGNOSIS — M810 Age-related osteoporosis without current pathological fracture: Secondary | ICD-10-CM

## 2018-01-27 MED ORDER — SIMVASTATIN 20 MG PO TABS: 20.0000 mg | ORAL_TABLET | Freq: Every day | ORAL | 1 refills | Status: DC

## 2018-01-27 MED ORDER — ZOSTER VAC RECOMB ADJUVANTED 50 MCG/0.5ML IM SUSR: 0.5000 mL | Freq: Once | INTRAMUSCULAR | 1 refills | Status: AC

## 2018-01-27 NOTE — Progress Notes (Signed)
Patient ID: Jasmine Fletcher, female   DOB: 02-Jan-1931, 84 y.o.   MRN: [id number]   Location:  [facility] OFFICE  Provider: DR Arletha Grippe  Code Status:  Goals of Care:  Advanced Directives 07/01/2017  Does Patient Have a Medical Advance Directive? Yes  Type of Advance Directive [place]  Does patient want to make changes to medical advance directive? No - Patient declined  Copy of [place] in Chart? No - copy requested  Would patient like information on creating a medical advance directive? -     Chief Complaint  Patient presents with  . Medical Management of Chronic Issues    4 month follow-up, discuss labs (copy printed)   . Medication Refill    If patient to continue current dose of cholesterol medication will need refill   . Immunizations    Shingrix     HPI: Patient is a 84 y.o. female seen today for medical management of chronic diseases.  She is a poor historian due to dementia. Hx obtained from chart.  She continues to go to the [facility] [employer] daily and volunteers in various projects. She states her [place] changed to a different location and is still adjusting.  Depression - mood stable. Increased stressors due to sister, Enid Derry, with Alzheimer's dementia was dx with met breast CA at age 56. She lost another sister from cancer (metastatic liver/lung/renal) at 63. She also notes several deaths in family in 2018   HTN - BP stable on lisinopril and HCTZ. No CP/SOB/palpitations. She takes ASA daily.   Hyperlipidemia - worsened. takes simvastatin. She maintains low fat low cholesterol diet. No myalgias. LDL 131  Abnormal TSH - last level 3.79 (prev 6.32)  Osteoporosis - stable. she gets Prolia injections every 6 mos since Aug 2015  Dementia - stable on aricept. No nightmares or diarrhea. She occasionally has hot flashes when she takes med at night. MMSE 24/30 in June 2018. Weight up 7 lbs.  Albumin 3.6  Anemia, hx iron deficiency  - stable. Hgb 11.1. Ferritin 144  Past Medical History:  Diagnosis Date  . Hyperlipidemia   . Hypertension   . Osteoporosis     Past Surgical History:  Procedure Laterality Date  . ABDOMINAL HYSTERECTOMY  1998     reports that she quit smoking about 11 years ago. She smoked 1.00 pack per day. she has never used smokeless tobacco. She reports that she does not drink alcohol or use drugs. Social History   Socioeconomic History  . Marital status: Single    Spouse name: Not on file  . Number of children: Not on file  . Years of education: Not on file  . Highest education level: Not on file  Social Needs  . Financial resource strain: Not on file  . Food insecurity - worry: Not on file  . Food insecurity - inability: Not on file  . Transportation needs - medical: Not on file  . Transportation needs - non-medical: Not on file  Occupational History  . Not on file  Tobacco Use  . Smoking status: Former Smoker    Packs/day: 1.00    Last attempt to quit: 12/08/2006    Years since quitting: 11.1  . Smokeless tobacco: Never Used  . Tobacco comment: Quit t age 36   Substance and Sexual Activity  . Alcohol use: No    Alcohol/week: 0.0 oz  . Drug use: No  . Sexual activity: Not on file  Other  Topics Concern  . Not on file  Social History Narrative  . Not on file    Family History  Problem Relation Age of Onset  . Cancer Father   . Dementia Sister   . Breast cancer Maternal Grandmother   . Breast cancer Paternal Grandmother     No Known Allergies  Outpatient Encounter Medications as of 01/27/2018  Medication Sig  . aspirin 81 MG tablet Take 81 mg by mouth daily.  . calcium-vitamin D (OSCAL 500/200 D-3) 500-200 MG-UNIT per tablet Take 1 tablet by mouth 2 (two) times daily.  Marland Kitchen donepezil (ARICEPT) 5 MG tablet take 1 tablet by mouth at bedtime  . hydrochlorothiazide (HYDRODIURIL) 25 MG tablet take 1 tablet by mouth once daily  . lisinopril (PRINIVIL,ZESTRIL) 5 MG tablet take  1 tablet by mouth once daily  . PROLIA 60 MG/ML SOLN injection Inject 60 mg into the skin every 6 (six) months.  . simvastatin (ZOCOR) 10 MG tablet take 1 tablet by mouth once daily  . Zoster Vaccine Adjuvanted [facility]) injection Inject 0.5 mLs into the muscle once for 1 dose.  . [DISCONTINUED] Zoster Vaccine Adjuvanted [facility]) injection Inject 0.5 mLs into the muscle once.   No facility-administered encounter medications on file as of 01/27/2018.     Review of Systems:  Review of Systems  Health Maintenance  Topic Date Due  . TETANUS/TDAP  10/15/2021  . INFLUENZA VACCINE  Completed  . DEXA SCAN  Completed  . PNA vac Low Risk Adult  Completed    Physical Exam: Vitals:   01/27/18 1000  BP: 128/80  Pulse: (!) 54  Temp: 97.9 F (36.6 C)  TempSrc: Oral  SpO2: 98%  Weight: 150 lb (68 kg)  Height: _0  (1.702 m)   Body mass index is 23.49 kg/m. Physical Exam  Constitutional: She appears well-developed and well-nourished.  HENT:  Mouth/Throat: Oropharynx is clear and moist. No oropharyngeal exudate.  MMM; no oral thrush  Eyes: Pupils are equal, round, and reactive to light. No scleral icterus.  Neck: Neck supple. Carotid bruit is not present. No tracheal deviation present. No thyromegaly present.  Cardiovascular: Normal rate, regular rhythm and intact distal pulses. Exam reveals no gallop and no friction rub.  Murmur (1/6 SEM) heard. No LE edema b/l. no calf TTP.   Pulmonary/Chest: Effort normal and breath sounds normal. No stridor. No respiratory distress. She has no wheezes. She has no rales.  Abdominal: Soft. Normal appearance and bowel sounds are normal. She exhibits no distension and no mass. There is no hepatomegaly. There is no tenderness. There is no rigidity, no rebound and no guarding. No hernia.  Musculoskeletal: She exhibits edema (small and large joints).  Lymphadenopathy:    She has no cervical adenopathy.  Neurological: She is alert.  Skin: Skin is warm  and dry. No rash noted.  Psychiatric: She has a normal mood and affect. Her behavior is normal. Thought content normal.    Labs reviewed: Basic Metabolic Panel: Recent Labs    05/27/17 1128 09/30/17 0829 01/25/18 0849  NA 135 135 135  K 3.8 4.1 4.5  CL 100 100 101  CO2 _1 GLUCOSE 73 75 86  BUN _2 CREATININE 0.93* 0.98* 0.99*  CALCIUM 9.3 9.0 9.4  TSH 4.42  --   --    Liver Function Tests: Recent Labs    03/05/17 1051 05/27/17 1128 09/30/17 0829 01/25/18 0849  AST _3 ALT 11* 7 7  8  ALKPHOS 52 68  --   --   BILITOT 0.2* 0.3 0.3 0.2  PROT 10.1* 9.8* 9.3* 9.9*  ALBUMIN 3.2* 3.6  --   --    No results for input(s): LIPASE, AMYLASE in the last 8760 hours. No results for input(s): AMMONIA in the last 8760 hours. CBC: Recent Labs    03/05/17 1042 05/27/17 1128 01/25/18 0849  WBC 4.3 4.2 4.4  NEUTROABS  --  2,352  --   HGB 10.8* 11.1* 11.1*  HCT 33.8* 35.4 34.7*  MCV 84.3 84.1 82.8  PLT 229 252 267   Lipid Panel: Recent Labs    05/27/17 1128 09/30/17 0829 01/25/18 0849  CHOL 178 181 204*  HDL 44* 51 50*  LDLCALC 109*  --   --   TRIG 123 98 118  CHOLHDL 4.0 3.5 4.1   No results found for: HGBA1C  Procedures since last visit: No results found.  Assessment/Plan   ICD-10-CM   1. Mixed hyperlipidemia E78.2 simvastatin (ZOCOR) 20 MG tablet    Lipid Panel  2. HYPERTENSION, BENIGN ESSENTIAL I10   3. Dementia without behavioral disturbance, unspecified dementia type F03.90   4. Senile osteoporosis M81.0   5. Iron deficiency anemia, unspecified iron deficiency anemia type D50.9 CBC with Differential/Platelets  6. High risk medication use Z79.899 CMP with eGFR([facility])  7. Abnormal TSH R79.89 TSH   INCREASE SIMVASTATIN 20MG DAILY FOR CHOLESTEROL  Continue other medications as ordered  Follow up in 4 mos for hyperlipidemia, anemia, dementia, HTN. Fasting labs prior to appt  Mason S. Perlie Gold  [facility] [address] [place], [place] [address] [phone] Cell (Monday-Friday 8 AM - 5 PM) [phone] After 5 PM and follow prompts

## 2018-01-27 NOTE — Patient Instructions (Addendum)
INCREASE SIMVASTATIN 20MG  DAILY FOR CHOLESTEROL  Continue other medications as ordered  Follow up in 4 mos for hyperlipidemia, anemia, dementia, HTN. Fasting labs prior to appt

## 2018-03-30 ENCOUNTER — Telehealth: Payer: Self-pay | Admitting: Internal Medicine

## 2018-03-30 NOTE — Telephone Encounter (Signed)
I called the pt's home number to scheduled AWV-S.  There was no answer and no option to leave a message.  I then called pt's daughter and left msg asking her to confirm this AWV-S w/ nurse before seeing Dr. Eulas Post. VDM (DD)

## 2018-03-30 NOTE — Telephone Encounter (Signed)
Patient confirmed appointment date and time for AWV and vist with Dr.Carter  S.Chrae B/CMA

## 2018-04-08 ENCOUNTER — Telehealth: Payer: Self-pay | Admitting: Internal Medicine

## 2018-04-08 NOTE — Telephone Encounter (Signed)
Left another msg w/ pt's daughter asking her to confirm AWV-S on 06/04/18. VDM (DD)

## 2018-04-08 NOTE — Telephone Encounter (Signed)
Patient return Jasmine Fletcher's call and confirmed appointment for 06/04/18

## 2018-06-01 ENCOUNTER — Other Ambulatory Visit: Payer: Medicare Other

## 2018-06-01 ENCOUNTER — Ambulatory Visit (INDEPENDENT_AMBULATORY_CARE_PROVIDER_SITE_OTHER): Payer: Medicare Other

## 2018-06-01 DIAGNOSIS — Z79899 Other long term (current) drug therapy: Secondary | ICD-10-CM

## 2018-06-01 DIAGNOSIS — R7989 Other specified abnormal findings of blood chemistry: Secondary | ICD-10-CM | POA: Diagnosis not present

## 2018-06-01 DIAGNOSIS — M810 Age-related osteoporosis without current pathological fracture: Secondary | ICD-10-CM

## 2018-06-01 DIAGNOSIS — E782 Mixed hyperlipidemia: Secondary | ICD-10-CM

## 2018-06-01 DIAGNOSIS — D509 Iron deficiency anemia, unspecified: Secondary | ICD-10-CM

## 2018-06-01 LAB — LIPID PANEL
CHOLESTEROL: 171 mg/dL (ref <200)
HDL: 48 mg/dL — ABNORMAL LOW (ref >50)
LDL CHOLESTEROL (CALC): 104 mg/dL — AB
Non-HDL Cholesterol (Calc): 123 mg/dL (calc) (ref <130)
TRIGLYCERIDES: 96 mg/dL (ref <150)
Total CHOL/HDL Ratio: 3.6 (calc) (ref <5.0)

## 2018-06-01 LAB — COMPLETE METABOLIC PANEL WITH GFR
AG Ratio: 0.6 (calc) — ABNORMAL LOW (ref 1.0–2.5)
ALBUMIN MSPROF: 3.6 g/dL (ref 3.6–5.1)
ALKALINE PHOSPHATASE (APISO): 58 U/L (ref 33–130)
ALT: 6 U/L (ref 6–29)
AST: 14 U/L (ref 10–35)
BUN/Creatinine Ratio: 23 (calc) — ABNORMAL HIGH (ref 6–22)
BUN: 25 mg/dL (ref 7–25)
CO2: 30 mmol/L (ref 20–32)
CREATININE: 1.1 mg/dL — AB (ref 0.60–0.88)
Calcium: 9.9 mg/dL (ref 8.6–10.4)
Chloride: 103 mmol/L (ref 98–110)
GFR, Est African American: 53 mL/min/{1.73_m2} — ABNORMAL LOW (ref >=60)
GFR, Est Non African American: 46 mL/min/{1.73_m2} — ABNORMAL LOW (ref >=60)
Globulin: 6.2 g/dL (calc) — ABNORMAL HIGH (ref 1.9–3.7)
Glucose, Bld: 81 mg/dL (ref 65–99)
Potassium: 3.9 mmol/L (ref 3.5–5.3)
SODIUM: 138 mmol/L (ref 135–146)
Total Bilirubin: 0.2 mg/dL (ref 0.2–1.2)
Total Protein: 9.8 g/dL — ABNORMAL HIGH (ref 6.1–8.1)

## 2018-06-01 LAB — CBC WITH DIFFERENTIAL/PLATELET
BASOS ABS: 42 {cells}/uL (ref 0–200)
Basophils Relative: 1 %
EOS ABS: 42 {cells}/uL (ref 15–500)
Eosinophils Relative: 1 %
HCT: 34.1 % — ABNORMAL LOW (ref 35.0–45.0)
Hemoglobin: 11 g/dL — ABNORMAL LOW (ref 11.7–15.5)
LYMPHS ABS: 1231 {cells}/uL (ref 850–3900)
MCH: 26.4 pg — AB (ref 27.0–33.0)
MCHC: 32.3 g/dL (ref 32.0–36.0)
MCV: 82 fL (ref 80.0–100.0)
MONOS PCT: 12.5 %
MPV: 9.9 fL (ref 7.5–12.5)
Neutro Abs: 2360 cells/uL (ref 1500–7800)
Neutrophils Relative %: 56.2 %
Platelets: 235 10*3/uL (ref 140–400)
RBC: 4.16 10*6/uL (ref 3.80–5.10)
RDW: 13.5 % (ref 11.0–15.0)
Total Lymphocyte: 29.3 %
WBC: 4.2 10*3/uL (ref 3.8–10.8)
WBCMIX: 525 {cells}/uL (ref 200–950)

## 2018-06-01 LAB — TSH: TSH: 4.42 m[IU]/L (ref 0.40–4.50)

## 2018-06-01 MED ORDER — DENOSUMAB 60 MG/ML ~~LOC~~ SOSY
60.0000 mg | PREFILLED_SYRINGE | Freq: Once | SUBCUTANEOUS | Status: AC
  Administered 2018-06-01: 60 mg via SUBCUTANEOUS

## 2018-06-04 ENCOUNTER — Encounter: Payer: Self-pay | Admitting: Internal Medicine

## 2018-06-04 ENCOUNTER — Ambulatory Visit (INDEPENDENT_AMBULATORY_CARE_PROVIDER_SITE_OTHER): Payer: Medicare Other | Admitting: Internal Medicine

## 2018-06-04 ENCOUNTER — Ambulatory Visit (INDEPENDENT_AMBULATORY_CARE_PROVIDER_SITE_OTHER): Payer: Medicare Other

## 2018-06-04 VITALS — BP 132/78 | HR 55 | Temp 97.9°F | Ht 67.0 in | Wt 149.4 lb

## 2018-06-04 VITALS — BP 132/78 | HR 55 | Temp 97.9°F | Ht 67.0 in | Wt 149.0 lb

## 2018-06-04 DIAGNOSIS — I10 Essential (primary) hypertension: Secondary | ICD-10-CM | POA: Diagnosis not present

## 2018-06-04 DIAGNOSIS — F0390 Unspecified dementia without behavioral disturbance: Secondary | ICD-10-CM | POA: Diagnosis not present

## 2018-06-04 DIAGNOSIS — M810 Age-related osteoporosis without current pathological fracture: Secondary | ICD-10-CM | POA: Diagnosis not present

## 2018-06-04 DIAGNOSIS — Z7189 Other specified counseling: Secondary | ICD-10-CM

## 2018-06-04 DIAGNOSIS — F5102 Adjustment insomnia: Secondary | ICD-10-CM | POA: Diagnosis not present

## 2018-06-04 DIAGNOSIS — Z0000 Encounter for general adult medical examination without abnormal findings: Secondary | ICD-10-CM

## 2018-06-04 DIAGNOSIS — E8809 Other disorders of plasma-protein metabolism, not elsewhere classified: Secondary | ICD-10-CM | POA: Diagnosis not present

## 2018-06-04 DIAGNOSIS — E782 Mixed hyperlipidemia: Secondary | ICD-10-CM | POA: Diagnosis not present

## 2018-06-04 DIAGNOSIS — D509 Iron deficiency anemia, unspecified: Secondary | ICD-10-CM | POA: Diagnosis not present

## 2018-06-04 MED ORDER — TRAZODONE HCL 50 MG PO TABS: 25.0000 mg | ORAL_TABLET | Freq: Every day | ORAL | 4 refills | Status: DC

## 2018-06-04 MED ORDER — ZOSTER VAC RECOMB ADJUVANTED 50 MCG/0.5ML IM SUSR: 0.5000 mL | Freq: Once | INTRAMUSCULAR | 1 refills | Status: AC

## 2018-06-04 NOTE — ACP (Advance Care Planning) (Signed)
Discussed living will and healthcare POA - she will further d/w her children before making any decisions and return signed notarized copy to office

## 2018-06-04 NOTE — Patient Instructions (Signed)
Jasmine Fletcher , Thank you for taking time to come for your Medicare Wellness Visit. I appreciate your ongoing commitment to your health goals. Please review the following plan we discussed and let me know if I can assist you in the future.   Screening recommendations/referrals: Colonoscopy excluded, over age 82 Mammogram excluded, over age 51 Bone Density up to date Recommended yearly ophthalmology/optometry visit for glaucoma screening and checkup Recommended yearly dental visit for hygiene and checkup  Vaccinations: Influenza vaccine up to date, due 2019 fall season Pneumococcal vaccine up to date, completed Tdap vaccine up to date, due 10/15/2021 Shingles vaccine due, ordered to pharmacy    Advanced directives: Advance directive discussed with you today. I have provided a copy for you to complete at home and have notarized. Once this is complete please bring a copy in to our office so we can scan it into your chart.  Conditions/risks identified: none  Next appointment: [name], RN 06/06/2019 @ 9:15am   Preventive Care 65 Years and Older, Female Preventive care refers to lifestyle choices and visits with your health care provider that can promote health and wellness. What does preventive care include?  A yearly physical exam. This is also called an annual well check.  Dental exams once or twice a year.  Routine eye exams. Ask your health care provider how often you should have your eyes checked.  Personal lifestyle choices, including:  Daily care of your teeth and gums.  Regular physical activity.  Eating a healthy diet.  Avoiding tobacco and drug use.  Limiting alcohol use.  Practicing safe sex.  Taking low-dose aspirin every day.  Taking vitamin and mineral supplements as recommended by your health care provider. What happens during an annual well check? The services and screenings done by your health care provider during your annual well check will depend on  your age, overall health, lifestyle risk factors, and family history of disease. Counseling  Your health care provider may ask you questions about your:  Alcohol use.  Tobacco use.  Drug use.  Emotional well-being.  Home and relationship well-being.  Sexual activity.  Eating habits.  History of falls.  Memory and ability to understand (cognition).  Work and work Statistician.  Reproductive health. Screening  You may have the following tests or measurements:  Height, weight, and BMI.  Blood pressure.  Lipid and cholesterol levels. These may be checked every 5 years, or more frequently if you are over 18 years old.  Skin check.  Lung cancer screening. You may have this screening every year starting at age 51 if you have a 30-pack-year history of smoking and currently smoke or have quit within the past 15 years.  Fecal occult blood test (FOBT) of the stool. You may have this test every year starting at age 78.  Flexible sigmoidoscopy or colonoscopy. You may have a sigmoidoscopy every 5 years or a colonoscopy every 10 years starting at age 41.  Hepatitis C blood test.  Hepatitis B blood test.  Sexually transmitted disease (STD) testing.  Diabetes screening. This is done by checking your blood sugar (glucose) after you have not eaten for a while (fasting). You may have this done every 1-3 years.  Bone density scan. This is done to screen for osteoporosis. You may have this done starting at age 63.  Mammogram. This may be done every 1-2 years. Talk to your health care provider about how often you should have regular mammograms. Talk with your health care provider about  your test results, treatment options, and if necessary, the need for more tests. Vaccines  Your health care provider may recommend certain vaccines, such as:  Influenza vaccine. This is recommended every year.  Tetanus, diphtheria, and acellular pertussis (Tdap, Td) vaccine. You may need a Td booster  every 10 years.  Zoster vaccine. You may need this after age 18.  Pneumococcal 13-valent conjugate (PCV13) vaccine. One dose is recommended after age 28.  Pneumococcal polysaccharide (PPSV23) vaccine. One dose is recommended after age 62. Talk to your health care provider about which screenings and vaccines you need and how often you need them. This information is not intended to replace advice given to you by your health care provider. Make sure you discuss any questions you have with your health care provider. Document Released: 12/21/2015 Document Revised: 08/13/2016 Document Reviewed: 09/25/2015 Elsevier Interactive Patient Education  2017 [place] Prevention in the Home Falls can cause injuries. They can happen to people of all ages. There are many things you can do to make your home safe and to help prevent falls. What can I do on the outside of my home?  Regularly fix the edges of walkways and driveways and fix any cracks.  Remove anything that might make you trip as you walk through a door, such as a raised step or threshold.  Trim any bushes or trees on the path to your home.  Use bright outdoor lighting.  Clear any walking paths of anything that might make someone trip, such as rocks or tools.  Regularly check to see if handrails are loose or broken. Make sure that both sides of any steps have handrails.  Any raised decks and porches should have guardrails on the edges.  Have any leaves, snow, or ice cleared regularly.  Use sand or salt on walking paths during winter.  Clean up any spills in your garage right away. This includes oil or grease spills. What can I do in the bathroom?  Use night lights.  Install grab bars by the toilet and in the tub and shower. Do not use towel bars as grab bars.  Use non-skid mats or decals in the tub or shower.  If you need to sit down in the shower, use a plastic, non-slip stool.  Keep the floor dry. Clean up any  water that spills on the floor as soon as it happens.  Remove soap buildup in the tub or shower regularly.  Attach bath mats securely with double-sided non-slip rug tape.  Do not have throw rugs and other things on the floor that can make you trip. What can I do in the bedroom?  Use night lights.  Make sure that you have a light by your bed that is easy to reach.  Do not use any sheets or blankets that are too big for your bed. They should not hang down onto the floor.  Have a firm chair that has side arms. You can use this for support while you get dressed.  Do not have throw rugs and other things on the floor that can make you trip. What can I do in the kitchen?  Clean up any spills right away.  Avoid walking on wet floors.  Keep items that you use a lot in easy-to-reach places.  If you need to reach something above you, use a strong step stool that has a grab bar.  Keep electrical cords out of the way.  Do not use floor polish or wax  that makes floors slippery. If you must use wax, use non-skid floor wax.  Do not have throw rugs and other things on the floor that can make you trip. What can I do with my stairs?  Do not leave any items on the stairs.  Make sure that there are handrails on both sides of the stairs and use them. Fix handrails that are broken or loose. Make sure that handrails are as long as the stairways.  Check any carpeting to make sure that it is firmly attached to the stairs. Fix any carpet that is loose or worn.  Avoid having throw rugs at the top or bottom of the stairs. If you do have throw rugs, attach them to the floor with carpet tape.  Make sure that you have a light switch at the top of the stairs and the bottom of the stairs. If you do not have them, ask someone to add them for you. What else can I do to help prevent falls?  Wear shoes that:  Do not have high heels.  Have rubber bottoms.  Are comfortable and fit you well.  Are closed  at the toe. Do not wear sandals.  If you use a stepladder:  Make sure that it is fully opened. Do not climb a closed stepladder.  Make sure that both sides of the stepladder are locked into place.  Ask someone to hold it for you, if possible.  Clearly mark and make sure that you can see:  Any grab bars or handrails.  First and last steps.  Where the edge of each step is.  Use tools that help you move around (mobility aids) if they are needed. These include:  Canes.  Walkers.  Scooters.  Crutches.  Turn on the lights when you go into a dark area. Replace any light bulbs as soon as they burn out.  Set up your furniture so you have a clear path. Avoid moving your furniture around.  If any of your floors are uneven, fix them.  If there are any pets around you, be aware of where they are.  Review your medicines with your doctor. Some medicines can make you feel dizzy. This can increase your chance of falling. Ask your doctor what other things that you can do to help prevent falls. This information is not intended to replace advice given to you by your health care provider. Make sure you discuss any questions you have with your health care provider. Document Released: 09/20/2009 Document Revised: 05/01/2016 Document Reviewed: 12/29/2014 Elsevier Interactive Patient Education  2017 [employer].

## 2018-06-04 NOTE — Progress Notes (Signed)
Subjective:   Jasmine Fletcher is a 84 y.o. female who presents for Medicare Annual (Subsequent) preventive examination.  Last AWV-05/27/2017    Objective:     Vitals: BP 132/78 (BP Location: Left Arm)   Pulse (!) 55   Temp 97.9 F (36.6 C) (Oral)   Ht 5\' 7"  (1.702 m)   Wt 149 lb (67.6 kg)   SpO2 97%   BMI 23.34 kg/m   Body mass index is 23.34 kg/m.  Advanced Directives 06/04/2018 06/04/2018 07/01/2017 05/27/2017 03/05/2017 10/08/2016 03/14/2016  Does Patient Have a Medical Advance Directive? No No Yes Yes No No Yes  Type of Advance Directive - - Healthcare Power of [place]  Does patient want to make changes to medical advance directive? - - No - Patient declined No - Patient declined - - No - Patient declined  Copy of [place] in Chart? - - No - copy requested No - copy requested - - No - copy requested  Would patient like information on creating a medical advance directive? Yes (MAU/Ambulatory/Procedural Areas - Information given) Yes (MAU/Ambulatory/Procedural Areas - Information given) - - - No - patient declined information -    Tobacco Social History   Tobacco Use  Smoking Status Former Smoker  . Packs/day: 1.00  . Last attempt to quit: 12/08/2006  . Years since quitting: 11.4  Smokeless Tobacco Never Used  Tobacco Comment   Quit t age 31      Counseling given: Not Answered Comment: Quit t age 12    Clinical Intake:  Pre-visit preparation completed: No  Pain : No/denies pain     Nutritional Risks: None Diabetes: No  How often do you need to have someone help you when you read instructions, pamphlets, or other written materials from your doctor or pharmacy?: 2 - Rarely What is the last grade level you completed in school?: 10th grade  Interpreter Needed?: No  Information entered by :: [name], RN  Past Medical History:  Diagnosis Date  . Hyperlipidemia   . Hypertension    . Osteoporosis    Past Surgical History:  Procedure Laterality Date  . ABDOMINAL HYSTERECTOMY  1998   Family History  Problem Relation Age of Onset  . Cancer Father   . Dementia Sister   . Breast cancer Maternal Grandmother   . Breast cancer Paternal Grandmother    Social History   Socioeconomic History  . Marital status: Single    Spouse name: Not on file  . Number of children: Not on file  . Years of education: Not on file  . Highest education level: Not on file  Occupational History  . Not on file  Social Needs  . Financial resource strain: Not hard at all  . Food insecurity:    Worry: Never true    Inability: Never true  . Transportation needs:    Medical: No    Non-medical: No  Tobacco Use  . Smoking status: Former Smoker    Packs/day: 1.00    Last attempt to quit: 12/08/2006    Years since quitting: 11.4  . Smokeless tobacco: Never Used  . Tobacco comment: Quit t age 84   Substance and Sexual Activity  . Alcohol use: No    Alcohol/week: 0.0 oz  . Drug use: No  . Sexual activity: Not on file  Lifestyle  . Physical activity:    Days per week: 3 days  Minutes per session: 30 min  . Stress: Not at all  Relationships  . Social connections:    Talks on phone: Three times a week    Gets together: Three times a week    Attends religious service: Never    Active member of club or organization: No    Attends meetings of clubs or organizations: Never    Relationship status: Never married  Other Topics Concern  . Not on file  Social History Narrative  . Not on file    Outpatient Encounter Medications as of 06/04/2018  Medication Sig  . aspirin 81 MG tablet Take 81 mg by mouth daily.  . calcium-vitamin D (OSCAL 500/200 D-3) 500-200 MG-UNIT per tablet Take 1 tablet by mouth 2 (two) times daily.  Marland Kitchen donepezil (ARICEPT) 5 MG tablet take 1 tablet by mouth at bedtime  . feeding supplement (BOOST HIGH PROTEIN) LIQD Take 1 Container by mouth daily.  .  hydrochlorothiazide (HYDRODIURIL) 25 MG tablet take 1 tablet by mouth once daily  . lisinopril (PRINIVIL,ZESTRIL) 5 MG tablet take 1 tablet by mouth once daily  . PROLIA 60 MG/ML SOLN injection Inject 60 mg into the skin every 6 (six) months.  . simvastatin (ZOCOR) 20 MG tablet Take 1 tablet (20 mg total) by mouth daily.  Marland Kitchen Zoster Vaccine Adjuvanted [facility]) injection Inject 0.5 mLs into the muscle once for 1 dose.  . [DISCONTINUED] Zoster Vaccine Adjuvanted [facility]) injection Inject 0.5 mLs into the muscle once.   No facility-administered encounter medications on file as of 06/04/2018.     Activities of Daily Living In your present state of health, do you have any difficulty performing the following activities: 06/04/2018  Hearing? N  Vision? N  Difficulty concentrating or making decisions? N  Walking or climbing stairs? N  Dressing or bathing? N  Doing errands, shopping? N  Preparing Food and eating ? N  Using the Toilet? N  In the past six months, have you accidently leaked urine? N  Do you have problems with loss of bowel control? N  Managing your Medications? N  Managing your Finances? N  Housekeeping or managing your Housekeeping? N  Some recent data might be hidden    Patient Care Team: Gildardo Cranker, DO as PCP - General (Internal Medicine)    Assessment:   This is a routine wellness examination for [name].  Exercise Activities and Dietary recommendations Current Exercise Habits: Home exercise routine, Type of exercise: walking, Time (Minutes): 30, Frequency (Times/Week): 3, Weekly Exercise (Minutes/Week): 90, Intensity: Mild, Exercise limited by: None identified  Goals    None      Fall Risk Fall Risk  06/04/2018 06/04/2018 01/27/2018 07/01/2017 05/27/2017  Falls in the past year? No No No Yes Yes  Number falls in past yr: - - - 1 1  Injury with Fall? - - - No No   Is the patient's home free of loose throw rugs in walkways, pet beds, electrical cords, etc?    yes      Grab bars in the bathroom? yes      Handrails on the stairs?   yes      Adequate lighting?   yes  Depression Screen PHQ 2/9 Scores 06/04/2018 06/04/2018 05/27/2017 03/14/2016  PHQ - 2 Score 0 0 0 0  PHQ- 9 Score - - - -     Cognitive Function MMSE - Mini Mental State Exam 06/04/2018 05/27/2017 03/14/2016 07/25/2014  Not completed: - - (No Data) -  Orientation to  time 5 5 5 5   Orientation to Place 5 5 5 5   Registration 3 3 3 3   Attention/ Calculation 3 0 4 4  Recall 2 3 2 2   Language- name 2 objects 2 2 2 2   Language- repeat 1 1 1 1   Language- follow 3 step command 3 3 3 3   Language- read & follow direction 1 1 1 1   Write a sentence 1 1 1 1   Copy design 0 0 0 0  Total score 26 24 27 27         Immunization History  Administered Date(s) Administered  . Influenza Whole 12/24/2006  . Influenza,inj,Quad PF,6+ Mos 01/30/2015, 10/02/2017  . Influenza-Unspecified 09/18/2011, 08/26/2013, 09/29/2016  . Pneumococcal Conjugate-13 07/25/2014  . Pneumococcal Polysaccharide-23 12/08/1996, 10/16/2011  . Td 11/05/2005  . Tdap 10/16/2011    Qualifies for Shingles Vaccine? Yes, educated and ordered to pharmacy  Screening Tests Health Maintenance  Topic Date Due  . INFLUENZA VACCINE  07/08/2018  . TETANUS/TDAP  10/15/2021  . DEXA SCAN  Completed  . PNA vac Low Risk Adult  Completed    Cancer Screenings: Lung: Low Dose CT Chest recommended if Age 102-80 years, 30 pack-year currently smoking OR have quit w/in 15years. Patient does not qualify. Breast:  Up to date on Mammogram? Yes   Up to date of Bone Density/Dexa? Yes Colorectal: up to date  Additional Screenings:  Hepatitis C Screening: declined     Plan:    I have personally reviewed and addressed the Medicare Annual Wellness questionnaire and have noted the following in the patient's chart:  A. Medical and social history B. Use of alcohol, tobacco or illicit drugs  C. Current medications and supplements D. Functional  ability and status E.  Nutritional status F.  Physical activity G. Advance directives H. List of other physicians I.  Hospitalizations, surgeries, and ER visits in previous 12 months J.  [place] to include hearing, vision, cognitive, depression L. Referrals and appointments - none  In addition, I have reviewed and discussed with patient certain preventive protocols, quality metrics, and best practice recommendations. A written personalized care plan for preventive services as well as general preventive health recommendations were provided to patient.  See attached scanned questionnaire for additional information.   Signed,   [name], RN Nurse Health Advisor  Patient Concerns: None

## 2018-06-04 NOTE — Patient Instructions (Signed)
START TRAZODONE 50MG  TAKE 0.5 TABLET (1/2 TABLET) AT BEDTIME TO HELP SLEEP  Continue other medications as ordered  Follow up for Prolia injection in December as scheduled  Follow up in 2 mos for insomnia, HTN, anemia, dementia.
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
## 2018-07-10 ENCOUNTER — Other Ambulatory Visit: Payer: Self-pay | Admitting: Internal Medicine

## 2018-07-28 ENCOUNTER — Encounter: Payer: Self-pay | Admitting: Internal Medicine

## 2018-08-06 ENCOUNTER — Ambulatory Visit (INDEPENDENT_AMBULATORY_CARE_PROVIDER_SITE_OTHER): Payer: Medicare Other | Admitting: Internal Medicine

## 2018-08-06 ENCOUNTER — Encounter: Payer: Self-pay | Admitting: Internal Medicine

## 2018-08-06 VITALS — BP 140/80 | HR 50 | Temp 98.0°F | Ht 67.0 in | Wt 150.0 lb

## 2018-08-06 DIAGNOSIS — F5102 Adjustment insomnia: Secondary | ICD-10-CM

## 2018-08-06 DIAGNOSIS — F0390 Unspecified dementia without behavioral disturbance: Secondary | ICD-10-CM | POA: Diagnosis not present

## 2018-08-06 DIAGNOSIS — Z23 Encounter for immunization: Secondary | ICD-10-CM | POA: Diagnosis not present

## 2018-08-06 DIAGNOSIS — E782 Mixed hyperlipidemia: Secondary | ICD-10-CM

## 2018-08-06 DIAGNOSIS — I10 Essential (primary) hypertension: Secondary | ICD-10-CM

## 2018-08-06 MED ORDER — LISINOPRIL 5 MG PO TABS: 5.0000 mg | ORAL_TABLET | Freq: Every day | ORAL | 1 refills | Status: DC

## 2018-08-06 MED ORDER — SIMVASTATIN 20 MG PO TABS: 20.0000 mg | ORAL_TABLET | Freq: Every day | ORAL | 1 refills | Status: DC

## 2018-08-06 MED ORDER — DONEPEZIL HCL 5 MG PO TABS: 5.0000 mg | ORAL_TABLET | Freq: Every day | ORAL | 1 refills | Status: DC

## 2018-08-06 NOTE — Patient Instructions (Addendum)
Flu shot given today  PROLIA DUE IN December (APPT ALREADY SCHEDULED)  Continue current medications as ordered  Continue to be active in your community  Follow up in 3 mos with Janett Billow for dementia, depression, HTN and hyperlipidemia

## 2018-08-06 NOTE — Progress Notes (Signed)
Patient ID: Jasmine Fletcher, female   DOB: [birth year], 84 y.o.   MRN: [id number]   Location:  [facility] OFFICE  Provider: DR Arletha Grippe  Code Status:  Goals of Care:  Advanced Directives 06/04/2018  Does Patient Have a Medical Advance Directive? No  Type of Advance Directive -  Does patient want to make changes to medical advance directive? -  Copy of [place] in Chart? -  Would patient like information on creating a medical advance directive? Yes (MAU/Ambulatory/Procedural Areas - Information given)     Chief Complaint  Patient presents with  . Medical Management of Chronic Issues    67mh follow-up    HPI: Patient is a 84y.o. female seen today for medical management of chronic diseases. She has not had any further issues with a stranger sitting on porch since a large light was placed there. Her sister, SEnid Derry is slowly declining and she noticed hearing worse. Pt is a poor historian due to dementia. Hx obtained from chart.  [facility] and volunteers in various projects. She states [place] and is still adjusting.  Depression - mood stable. Increased stressors due to sister, SEnid Derry with Alzheimer's dementia was dx with met breast CA at age 89 She lost another sister from cancer (metastatic liver/lung/renal) at 749 She also notes several deaths in family in 2018   HTN - BP stable on lisinopril and HCTZ. No CP/SOB/palpitations. She takes ASA daily.   Hyperlipidemia - improved. takes simvastatin. She maintains low fat low cholesterol diet. No myalgias. LDL 104 (prev 131)  Abnormal TSH - stable;  last level 4.42   Osteoporosis - stable. she gets Prolia injections every 6 mos (due in 11/2018)since Aug 2015  Dementia - stable on aricept. No nightmares or diarrhea. She occasionally has hot flashes when she takes med at night. MMSE 26/30 in June 2019. Weight up 1 lbs.  Albumin  3.6  Anemia, hx iron deficiency - stable. Hgb 11. Ferritin 144 (June 2018)    Past Medical History:  Diagnosis Date  . Hyperlipidemia   . Hypertension   . Osteoporosis     Past Surgical History:  Procedure Laterality Date  . ABDOMINAL HYSTERECTOMY  1998     reports that she quit smoking about 11 years ago. She smoked 1.00 pack per day. She has never used smokeless tobacco. She reports that she does not drink alcohol or use drugs. Social History   Socioeconomic History  . Marital status: Single    Spouse name: Not on file  . Number of children: Not on file  . Years of education: Not on file  . Highest education level: Not on file  Occupational History  . Not on file  Social Needs  . Financial resource strain: Not hard at all  . Food insecurity:    Worry: Never true    Inability: Never true  . Transportation needs:    Medical: No    Non-medical: No  Tobacco Use  . Smoking status: Former Smoker    Packs/day: 1.00    Last attempt to quit: 12/08/2006    Years since quitting: 11.6  . Smokeless tobacco: Never Used  . Tobacco comment: Quit t age 84  Substance and Sexual Activity  . Alcohol use: No    Alcohol/week: 0.0 standard drinks  . Drug use: No  . Sexual activity: Not on file  Lifestyle  . Physical  activity:    Days per week: 3 days    Minutes per session: 30 min  . Stress: Not at all  Relationships  . Social connections:    Talks on phone: Three times a week    Gets together: Three times a week    Attends religious service: Never    Active member of club or organization: No    Attends meetings of clubs or organizations: Never    Relationship status: Never married  . Intimate partner violence:    Fear of current or ex partner: No    Emotionally abused: No    Physically abused: No    Forced sexual activity: No  Other Topics Concern  . Not on file  Social History Narrative  . Not on file    Family History  Problem Relation Age of Onset  . Cancer  Father   . Dementia Sister   . Breast cancer Maternal Grandmother   . Breast cancer Paternal Grandmother     No Known Allergies  Outpatient Encounter Medications as of 08/06/2018  Medication Sig  . aspirin 81 MG tablet Take 81 mg by mouth daily.  . calcium-vitamin D (OSCAL 500/200 D-3) 500-200 MG-UNIT per tablet Take 1 tablet by mouth 2 (two) times daily.  Marland Kitchen donepezil (ARICEPT) 5 MG tablet take 1 tablet by mouth at bedtime  . feeding supplement (BOOST HIGH PROTEIN) LIQD Take 1 Container by mouth daily.  . hydrochlorothiazide (HYDRODIURIL) 25 MG tablet TAKE 1 TABLET BY MOUTH EVERY DAY  . lisinopril (PRINIVIL,ZESTRIL) 5 MG tablet take 1 tablet by mouth once daily  . PROLIA 60 MG/ML SOLN injection Inject 60 mg into the skin every 6 (six) months.  . simvastatin (ZOCOR) 20 MG tablet Take 1 tablet (20 mg total) by mouth daily.  . traZODone (DESYREL) 50 MG tablet Take 0.5 tablets (25 mg total) by mouth at bedtime. For sleep   No facility-administered encounter medications on file as of 08/06/2018.     Review of Systems:  Review of Systems  Unable to perform ROS: Dementia    Health Maintenance  Topic Date Due  . INFLUENZA VACCINE  07/08/2018  . TETANUS/TDAP  10/15/2021  . DEXA SCAN  Completed  . PNA vac Low Risk Adult  Completed    Physical Exam: Vitals:   08/06/18 1008  BP: 140/80  Pulse: (!) 50  Temp: 98 F (36.7 C)  TempSrc: Oral  SpO2: 97%  Weight: 150 lb (68 kg)  Height: 5' 7"  (1.702 m)   Body mass index is 23.49 kg/m. Physical Exam  Constitutional: She is oriented to person, place, and time. She appears well-developed and well-nourished.  HENT:  Mouth/Throat: Oropharynx is clear and moist. No oropharyngeal exudate.  MMM; no oral thrush  Eyes: Pupils are equal, round, and reactive to light. No scleral icterus.  Neck: Neck supple. Carotid bruit is not present. No tracheal deviation present. No thyromegaly present.  Cardiovascular: Normal rate, regular rhythm and  intact distal pulses. Exam reveals no gallop and no friction rub.  Murmur (1/6 SEM) heard. No LE edema b/l. no calf TTP.   Pulmonary/Chest: Effort normal and breath sounds normal. No stridor. No respiratory distress. She has no wheezes. She has no rales.  Abdominal: Soft. Normal appearance and bowel sounds are normal. She exhibits no distension and no mass. There is no hepatomegaly. There is no tenderness. There is no rigidity, no rebound and no guarding. No hernia.  Musculoskeletal: She exhibits edema (small and large joints).  Lymphadenopathy:    She has no cervical adenopathy.  Neurological: She is alert and oriented to person, place, and time. She has normal reflexes.  Skin: Skin is warm and dry. No rash noted.  Psychiatric: She has a normal mood and affect. Her behavior is normal. Thought content normal.    Labs reviewed: Basic Metabolic Panel: Recent Labs    09/30/17 0829 01/25/18 0849 06/01/18 0934  NA 135 135 138  K 4.1 4.5 3.9  CL 100 101 103  CO2 29 29 30   GLUCOSE 75 86 81  BUN 15 20 25   CREATININE 0.98* 0.99* 1.10*  CALCIUM 9.0 9.4 9.9  TSH  --   --  4.42   Liver Function Tests: Recent Labs    09/30/17 0829 01/25/18 0849 06/01/18 0934  AST 13 16 14   ALT 7 8 6   BILITOT 0.3 0.2 0.2  PROT 9.3* 9.9* 9.8*   No results for input(s): LIPASE, AMYLASE in the last 8760 hours. No results for input(s): AMMONIA in the last 8760 hours. CBC: Recent Labs    01/25/18 0849 06/01/18 0934  WBC 4.4 4.2  NEUTROABS  --  2,360  HGB 11.1* 11.0*  HCT 34.7* 34.1*  MCV 82.8 82.0  PLT 267 235   Lipid Panel: Recent Labs    09/30/17 0829 01/25/18 0849 06/01/18 0934  CHOL 181 204* 171  HDL 51 50* 48*  LDLCALC 110* 131* 104*  TRIG 98 118 96  CHOLHDL 3.5 4.1 3.6   No results found for: HGBA1C  Procedures since last visit: No results found.  Assessment/Plan   ICD-10-CM   1. Essential hypertension I10 lisinopril (PRINIVIL,ZESTRIL) 5 MG tablet  2. Adjustment insomnia  F51.02    improved  3. Mixed hyperlipidemia E78.2 simvastatin (ZOCOR) 20 MG tablet  4. Dementia without behavioral disturbance, unspecified dementia type F03.90 donepezil (ARICEPT) 5 MG tablet  5. Need for influenza vaccination Z23 Flu Vaccine QUAD 6+ mos PF IM (Fluarix Quad PF)    Flu shot given today  PROLIA DUE IN December (APPT ALREADY SCHEDULED)  Continue current medications as ordered  Continue to be active in your community  Follow up in 3 mos with Janett Billow for dementia, depression, HTN and hyperlipidemia   Vin Yonke S. Perlie Gold  [facility] [address] [place], [place] [address] [phone] Cell (Monday-Friday 8 AM - 5 PM) [phone] After 5 PM and follow prompts

## 2018-11-15 ENCOUNTER — Ambulatory Visit (INDEPENDENT_AMBULATORY_CARE_PROVIDER_SITE_OTHER): Payer: Medicare Other | Admitting: Nurse Practitioner

## 2018-11-15 ENCOUNTER — Encounter: Payer: Self-pay | Admitting: Nurse Practitioner

## 2018-11-15 VITALS — BP 134/78 | HR 67 | Temp 98.3°F | Ht 67.0 in | Wt 154.0 lb

## 2018-11-15 DIAGNOSIS — F0390 Unspecified dementia without behavioral disturbance: Secondary | ICD-10-CM

## 2018-11-15 DIAGNOSIS — D509 Iron deficiency anemia, unspecified: Secondary | ICD-10-CM

## 2018-11-15 DIAGNOSIS — F5102 Adjustment insomnia: Secondary | ICD-10-CM | POA: Diagnosis not present

## 2018-11-15 DIAGNOSIS — E782 Mixed hyperlipidemia: Secondary | ICD-10-CM | POA: Diagnosis not present

## 2018-11-15 DIAGNOSIS — I10 Essential (primary) hypertension: Secondary | ICD-10-CM | POA: Diagnosis not present

## 2018-11-15 DIAGNOSIS — M810 Age-related osteoporosis without current pathological fracture: Secondary | ICD-10-CM

## 2018-11-15 NOTE — Progress Notes (Signed)
Careteam: Patient Care Team: Gildardo Cranker, DO as PCP - General (Internal Medicine)  Advanced Directive information Does Patient Have a Medical Advance Directive?: No  No Known Allergies  Chief Complaint  Patient presents with  . Medical Management of Chronic Issues    Pt is being seen for a 3 month routine visit. Pt has no concerns today.      HPI: Patient is a 84 y.o. female seen in the office today for routine follow up on chronic conditions. Here today alone, former pt of Dr Saralyn Pilar. Apparently she is a poor historian due to dementia. Takes transportation because she does not drive. Lives alone but daughter helps her does her bills and checks, goes grocery shopping for her.  She continues to go to the [employer] daily and volunteers in various projects.   Depression - mood stable. Reports this month is hard due to son passing in 2004 but his birthday was dec 24. Continues to bake his favorite cake.  Increased stressors due to sister, Enid Derry, with Alzheimer's dementia was dx with met breast CA at age 82. She lost another sister from cancer (metastatic liver/lung/renal) at 74. several deaths in family in 2018   Using trazodone 1/2 tablet PRN for sleep, does well with this.   HTN - BP stable on lisinopril and HCTZ. No CP/SOB/palpitations. She takes ASA daily.   Hyperlipidemia - stable. takes simvastatin. She maintains low fat low cholesterol diet. No myalgias. LDL 104 (prev 131)  Abnormal TSH - stable;  last level 4.42   Osteoporosis - stable. she gets Prolia injections every 6 mos (due in 12/ 30/2019)since Aug 2015- on the list for injection.  Dementia - stable on aricept. No nightmares or diarrhea. She occasionally has hot flashes when she takes med at night. MMSE 26/30 in June 2019. Continues to stay active, continues to cook and staying active.   Anemia, hx iron deficiency - stable. Hgb 11. Ferritin 144 (June 2018)    Review of Systems:  Review of Systems    Constitutional: Negative for chills, fever and weight loss.  HENT: Negative for tinnitus.   Respiratory: Negative for cough, sputum production and shortness of breath.   Cardiovascular: Negative for chest pain, palpitations and leg swelling.  Gastrointestinal: Negative for abdominal pain, constipation, diarrhea and heartburn.  Genitourinary: Negative for dysuria, frequency and urgency.  Musculoskeletal: Negative for back pain, falls, joint pain and myalgias.  Skin: Negative.   Neurological: Negative for dizziness and headaches.  Psychiatric/Behavioral: Negative for depression and memory loss. The patient does not have insomnia.        Depression in December due to it being her late son's birthday but able to cope appropriately.     Past Medical History:  Diagnosis Date  . Hyperlipidemia   . Hypertension   . Osteoporosis    Past Surgical History:  Procedure Laterality Date  . ABDOMINAL HYSTERECTOMY  1998   Social History:   reports that she quit smoking about 11 years ago. She smoked 1.00 pack per day. She has never used smokeless tobacco. She reports that she does not drink alcohol or use drugs.  Family History  Problem Relation Age of Onset  . Cancer Father   . Dementia Sister   . Breast cancer Maternal Grandmother   . Breast cancer Paternal Grandmother     Medications: Patient's Medications  New Prescriptions   No medications on file  Previous Medications   ASPIRIN 81 MG TABLET    Take 81  mg by mouth daily.   CALCIUM-VITAMIN D (OSCAL 500/200 D-3) 500-200 MG-UNIT PER TABLET    Take 1 tablet by mouth 2 (two) times daily.   DONEPEZIL (ARICEPT) 5 MG TABLET    Take 1 tablet (5 mg total) by mouth at bedtime.   FEEDING SUPPLEMENT (BOOST HIGH PROTEIN) LIQD    Take 1 Container by mouth daily.   HYDROCHLOROTHIAZIDE (HYDRODIURIL) 25 MG TABLET    TAKE 1 TABLET BY MOUTH EVERY DAY   LISINOPRIL (PRINIVIL,ZESTRIL) 5 MG TABLET    Take 1 tablet (5 mg total) by mouth daily.   PROLIA 60  MG/ML SOLN INJECTION    Inject 60 mg into the skin every 6 (six) months.   SIMVASTATIN (ZOCOR) 20 MG TABLET    Take 1 tablet (20 mg total) by mouth daily.   TRAZODONE (DESYREL) 50 MG TABLET    Take 0.5 tablets (25 mg total) by mouth at bedtime. For sleep  Modified Medications   No medications on file  Discontinued Medications   No medications on file     Physical Exam:  Vitals:   11/15/18 0938  BP: 134/78  Pulse: 67  Temp: 98.3 F (36.8 C)  TempSrc: Oral  SpO2: 97%  Weight: 154 lb (69.9 kg)  Height: 5' 7"  (1.702 m)   Body mass index is 24.12 kg/m.  Physical Exam  Constitutional: She is oriented to person, place, and time. She appears well-developed and well-nourished.  HENT:  Mouth/Throat: Oropharynx is clear and moist. No oropharyngeal exudate.  MMM; no oral thrush  Eyes: Pupils are equal, round, and reactive to light. No scleral icterus.  Neck: Neck supple. Carotid bruit is not present. No tracheal deviation present. No thyromegaly present.  Cardiovascular: Normal rate, regular rhythm and intact distal pulses. Exam reveals no gallop and no friction rub.  Murmur (1/6 SEM) heard. No LE edema b/l. no calf TTP.   Pulmonary/Chest: Effort normal and breath sounds normal. No stridor. No respiratory distress. She has no wheezes. She has no rales.  Abdominal: Soft. Normal appearance and bowel sounds are normal. She exhibits no distension and no mass. There is no hepatomegaly. There is no tenderness. There is no rigidity, no rebound and no guarding. No hernia.  Musculoskeletal: She exhibits edema (small and large joints).  Lymphadenopathy:    She has no cervical adenopathy.  Neurological: She is alert and oriented to person, place, and time. She has normal reflexes.  Skin: Skin is warm and dry. No rash noted.  Psychiatric: She has a normal mood and affect. Her behavior is normal. Thought content normal.    Labs reviewed: Basic Metabolic Panel: Recent Labs    01/25/18 0849  06/01/18 0934  NA 135 138  K 4.5 3.9  CL 101 103  CO2 29 30  GLUCOSE 86 81  BUN 20 25  CREATININE 0.99* 1.10*  CALCIUM 9.4 9.9  TSH  --  4.42   Liver Function Tests: Recent Labs    01/25/18 0849 06/01/18 0934  AST 16 14  ALT 8 6  BILITOT 0.2 0.2  PROT 9.9* 9.8*   No results for input(s): LIPASE, AMYLASE in the last 8760 hours. No results for input(s): AMMONIA in the last 8760 hours. CBC: Recent Labs    01/25/18 0849 06/01/18 0934  WBC 4.4 4.2  NEUTROABS  --  2,360  HGB 11.1* 11.0*  HCT 34.7* 34.1*  MCV 82.8 82.0  PLT 267 235   Lipid Panel: Recent Labs    01/25/18 0849 06/01/18  0934  CHOL 204* 171  HDL 50* 48*  LDLCALC 131* 104*  TRIG 118 96  CHOLHDL 4.1 3.6   TSH: Recent Labs    06/01/18 0934  TSH 4.42   A1C: No results found for: HGBA1C   Assessment/Plan 1. Essential hypertension -controlled on lisinopril  - BASIC METABOLIC PANEL WITH GFR  2. Adjustment insomnia -stable on trazodone, does not need every night.  3. Mixed hyperlipidemia -LDL stable on zocor  4. Dementia without behavioral disturbance, unspecified dementia type ([place]) -continues on aricept, doing well currently living alone but daughter helping with her iADLs and checking in on her frequently. She remains very active.   5. Iron deficiency anemia, unspecified iron deficiency anemia type - CBC with Differential/Platelets  6. Osteoporosis -continues on prolia injection, appt has already been scheduled.   Next appt: 4 months, sooner if needed  Eleny Cortez K. Cla[place], [facility] [phone]

## 2018-11-15 NOTE — Patient Instructions (Addendum)
Follow up in 4 months for routine follow up, will get fasting blood work at that time  Stay active!  Keep appt for Prolia.

## 2018-11-16 LAB — BASIC METABOLIC PANEL WITH GFR
BUN/Creatinine Ratio: 17 (calc) (ref 6–22)
BUN: 19 mg/dL (ref 7–25)
CALCIUM: 9.7 mg/dL (ref 8.6–10.4)
CO2: 30 mmol/L (ref 20–32)
Chloride: 102 mmol/L (ref 98–110)
Creat: 1.1 mg/dL — ABNORMAL HIGH (ref 0.60–0.88)
GFR, EST AFRICAN AMERICAN: 53 mL/min/{1.73_m2} — AB (ref >=60)
GFR, EST NON AFRICAN AMERICAN: 46 mL/min/{1.73_m2} — AB (ref >=60)
Glucose, Bld: 79 mg/dL (ref 65–139)
Potassium: 3.8 mmol/L (ref 3.5–5.3)
Sodium: 137 mmol/L (ref 135–146)

## 2018-11-16 LAB — CBC WITH DIFFERENTIAL/PLATELET
BASOS PCT: 0.9 %
Basophils Absolute: 39 cells/uL (ref 0–200)
EOS ABS: 39 {cells}/uL (ref 15–500)
Eosinophils Relative: 0.9 %
HCT: 33.3 % — ABNORMAL LOW (ref 35.0–45.0)
HEMOGLOBIN: 10.8 g/dL — AB (ref 11.7–15.5)
Lymphs Abs: 1140 cells/uL (ref 850–3900)
MCH: 27.1 pg (ref 27.0–33.0)
MCHC: 32.4 g/dL (ref 32.0–36.0)
MCV: 83.5 fL (ref 80.0–100.0)
MPV: 10.3 fL (ref 7.5–12.5)
Monocytes Relative: 12.6 %
NEUTROS ABS: 2541 {cells}/uL (ref 1500–7800)
Neutrophils Relative %: 59.1 %
Platelets: 252 10*3/uL (ref 140–400)
RBC: 3.99 10*6/uL (ref 3.80–5.10)
RDW: 13.7 % (ref 11.0–15.0)
Total Lymphocyte: 26.5 %
WBC: 4.3 10*3/uL (ref 3.8–10.8)
WBCMIX: 542 {cells}/uL (ref 200–950)

## 2018-12-02 ENCOUNTER — Ambulatory Visit: Payer: Self-pay

## 2018-12-06 ENCOUNTER — Ambulatory Visit (INDEPENDENT_AMBULATORY_CARE_PROVIDER_SITE_OTHER): Payer: Medicare Other

## 2018-12-06 DIAGNOSIS — M810 Age-related osteoporosis without current pathological fracture: Secondary | ICD-10-CM | POA: Diagnosis not present

## 2018-12-06 MED ORDER — DENOSUMAB 60 MG/ML ~~LOC~~ SOSY
60.0000 mg | PREFILLED_SYRINGE | Freq: Once | SUBCUTANEOUS | Status: AC
  Administered 2018-12-06: 60 mg via SUBCUTANEOUS

## 2018-12-27 ENCOUNTER — Encounter: Payer: Self-pay | Admitting: Nurse Practitioner

## 2019-01-11 ENCOUNTER — Other Ambulatory Visit: Payer: Self-pay | Admitting: *Deleted

## 2019-01-11 DIAGNOSIS — F0390 Unspecified dementia without behavioral disturbance: Secondary | ICD-10-CM

## 2019-01-11 MED ORDER — DONEPEZIL HCL 5 MG PO TABS: 5.0000 mg | ORAL_TABLET | Freq: Every day | ORAL | 1 refills | Status: DC

## 2019-01-11 MED ORDER — HYDROCHLOROTHIAZIDE 25 MG PO TABS: 25.0000 mg | ORAL_TABLET | Freq: Every day | ORAL | 1 refills | Status: DC

## 2019-01-11 NOTE — Telephone Encounter (Signed)
[facility]

## 2019-02-02 ENCOUNTER — Other Ambulatory Visit: Payer: Self-pay | Admitting: *Deleted

## 2019-02-02 DIAGNOSIS — I10 Essential (primary) hypertension: Secondary | ICD-10-CM

## 2019-02-02 DIAGNOSIS — E782 Mixed hyperlipidemia: Secondary | ICD-10-CM

## 2019-02-02 MED ORDER — SIMVASTATIN 20 MG PO TABS: 20.0000 mg | ORAL_TABLET | Freq: Every day | ORAL | 1 refills | Status: DC

## 2019-02-02 MED ORDER — LISINOPRIL 5 MG PO TABS: 5.0000 mg | ORAL_TABLET | Freq: Every day | ORAL | 1 refills | Status: DC

## 2019-02-02 NOTE — Telephone Encounter (Signed)
[facility] 

## 2019-02-02 NOTE — Telephone Encounter (Signed)
[facility]

## 2019-03-07 ENCOUNTER — Other Ambulatory Visit: Payer: Self-pay | Admitting: *Deleted

## 2019-03-07 DIAGNOSIS — F5102 Adjustment insomnia: Secondary | ICD-10-CM

## 2019-03-07 MED ORDER — TRAZODONE HCL 50 MG PO TABS: 25.0000 mg | ORAL_TABLET | Freq: Every day | ORAL | 1 refills | Status: DC

## 2019-03-07 NOTE — Telephone Encounter (Signed)
[facility]

## 2019-03-18 ENCOUNTER — Ambulatory Visit: Payer: Self-pay | Admitting: Nurse Practitioner

## 2019-03-23 ENCOUNTER — Ambulatory Visit (INDEPENDENT_AMBULATORY_CARE_PROVIDER_SITE_OTHER): Payer: Medicare Other | Admitting: Nurse Practitioner

## 2019-03-23 ENCOUNTER — Other Ambulatory Visit: Payer: Self-pay

## 2019-03-23 ENCOUNTER — Encounter: Payer: Self-pay | Admitting: Nurse Practitioner

## 2019-03-23 DIAGNOSIS — F0390 Unspecified dementia without behavioral disturbance: Secondary | ICD-10-CM | POA: Diagnosis not present

## 2019-03-23 DIAGNOSIS — F5102 Adjustment insomnia: Secondary | ICD-10-CM

## 2019-03-23 DIAGNOSIS — E782 Mixed hyperlipidemia: Secondary | ICD-10-CM

## 2019-03-23 DIAGNOSIS — M810 Age-related osteoporosis without current pathological fracture: Secondary | ICD-10-CM | POA: Diagnosis not present

## 2019-03-23 DIAGNOSIS — I10 Essential (primary) hypertension: Secondary | ICD-10-CM

## 2019-03-23 DIAGNOSIS — F325 Major depressive disorder, single episode, in full remission: Secondary | ICD-10-CM

## 2019-03-23 DIAGNOSIS — D509 Iron deficiency anemia, unspecified: Secondary | ICD-10-CM

## 2019-03-23 MED ORDER — LISINOPRIL 5 MG PO TABS: 5.0000 mg | ORAL_TABLET | Freq: Every day | ORAL | 1 refills | Status: DC

## 2019-03-23 MED ORDER — ZOSTER VAC RECOMB ADJUVANTED 50 MCG/0.5ML IM SUSR: 0.5000 mL | Freq: Once | INTRAMUSCULAR | 1 refills | Status: AC

## 2019-03-23 MED ORDER — DONEPEZIL HCL 5 MG PO TABS: 5.0000 mg | ORAL_TABLET | Freq: Every day | ORAL | 1 refills | Status: DC

## 2019-03-23 NOTE — Progress Notes (Signed)
This service is provided via telemedicine  No vital signs collected/recorded due to the encounter was a telemedicine visit.   Location of patient (ex: home, work): Home  Patient consents to a telephone visit:  Yes  Location of the provider (ex: office, home):  [employer], Office   Names of all persons participating in the telemedicine service and their role in the encounter:  S.Chrae B/CMA, Sherrie Mustache, NP, and Patient   Time spent on call:  9 min with medical assistant   Virtual Visit via Telephone Note  I connected with Jasmine Fletcher on 03/23/19 at  1:00 PM EDT by telephone and verified that I am speaking with the correct person using two identifiers.   I discussed the limitations, risks, security and privacy concerns of performing an evaluation and management service by telephone and the availability of in person appointments. I also discussed with the patient that there may be a patient responsible charge related to this service. The patient expressed understanding and agreed to proceed.      Careteam: Patient Care Team: Lauree Chandler, NP as PCP - General (Geriatric Medicine)  Advanced Directive information Does Patient Have a Medical Advance Directive?: Festus Holts, Would patient like information on creating a medical advance directive?: Yes (MAU/Ambulatory/Procedural Areas - Information given)(Patient has paperwork from previous visit )  No Known Allergies  Chief Complaint  Patient presents with  . Medical Management of Chronic Issues    4 month follow-up. Patient c/o sleep issues, Trazadone ineffective.Tele-visit   . Medication Refill    Lisinopril and Aricept      HPI: Patient is a 85 y.o. female for routine follow up.  Here today alone, former pt of Dr Saralyn Pilar.  Depression - mood stable. no increase in symptoms. Feels like she is doing "great" and staying busy.   Insomnia- Using trazodone 1/2 tablet PRN for sleep, does well with this.   HTN  -does not take blood pressure at home. She is careful with what she eats. Maintained on lisinopril and HCTZ. No CP/SOB/palpitations. She takes ASA daily. Does not added salt. eats Baked or broiled chicken   Hyperlipidemia - stable. takes simvastatin. She maintains low fat low cholesterol diet. No side effect from simvastain. LDL 104 (prev 131)  Abnormal TSH - stable; not requiring supplement. last level 4.42   Osteoporosis - stable. she gets Prolia injections every 6 mos(last injection on 12/ 30/2019) since Aug 2015.  Dementia - stable on aricept. Lives alone but daughter helps her. She rides county transportation if needed.  Taking aricept- reports bad dreams.   Anemia, hx iron deficiency - stable hgb in december, not on supplement   Review of Systems:  Review of Systems  Constitutional: Negative for chills, fever and weight loss.  HENT: Negative for hearing loss.   Respiratory: Negative for cough, sputum production and shortness of breath.   Cardiovascular: Negative for chest pain, palpitations and leg swelling.  Gastrointestinal: Negative for abdominal pain, constipation, diarrhea and heartburn.  Genitourinary: Negative for dysuria, frequency and urgency.  Musculoskeletal: Negative for back pain, falls, joint pain and myalgias.  Skin: Negative.   Neurological: Negative for dizziness and headaches.  Psychiatric/Behavioral: Negative for depression and memory loss. The patient does not have insomnia.     Past Medical History:  Diagnosis Date  . Hyperlipidemia   . Hypertension   . Osteoporosis    Past Surgical History:  Procedure Laterality Date  . ABDOMINAL HYSTERECTOMY  1998   Social History:  reports that she quit smoking about 12 years ago. She smoked 1.00 pack per day. She has never used smokeless tobacco. She reports that she does not drink alcohol or use drugs.  Family History  Problem Relation Age of Onset  . Cancer Father   . Dementia Sister   . Breast  cancer Maternal Grandmother   . Breast cancer Paternal Grandmother     Medications: Patient's Medications  New Prescriptions   No medications on file  Previous Medications   ASPIRIN 81 MG TABLET    Take 81 mg by mouth daily.   CALCIUM-VITAMIN D (OSCAL 500/200 D-3) 500-200 MG-UNIT PER TABLET    Take 1 tablet by mouth 2 (two) times daily.   DONEPEZIL (ARICEPT) 5 MG TABLET    Take 1 tablet (5 mg total) by mouth at bedtime.   FEEDING SUPPLEMENT (BOOST HIGH PROTEIN) LIQD    Take 1 Container by mouth daily.   HYDROCHLOROTHIAZIDE (HYDRODIURIL) 25 MG TABLET    Take 1 tablet (25 mg total) by mouth daily.   LISINOPRIL (PRINIVIL,ZESTRIL) 5 MG TABLET    Take 1 tablet (5 mg total) by mouth daily.   PROLIA 60 MG/ML SOLN INJECTION    Inject 60 mg into the skin every 6 (six) months.   SIMVASTATIN (ZOCOR) 20 MG TABLET    Take 1 tablet (20 mg total) by mouth daily.   TRAZODONE (DESYREL) 50 MG TABLET    Take 0.5 tablets (25 mg total) by mouth at bedtime. For sleep  Modified Medications   No medications on file  Discontinued Medications   No medications on file     Physical Exam: unable due to tele-visit    Labs reviewed: Basic Metabolic Panel: Recent Labs    06/01/18 0934 11/15/18 1007  NA 138 137  K 3.9 3.8  CL 103 102  CO2 30 30  GLUCOSE 81 79  BUN 25 19  CREATININE 1.10* 1.10*  CALCIUM 9.9 9.7  TSH 4.42  --    Liver Function Tests: Recent Labs    06/01/18 0934  AST 14  ALT 6  BILITOT 0.2  PROT 9.8*   No results for input(s): LIPASE, AMYLASE in the last 8760 hours. No results for input(s): AMMONIA in the last 8760 hours. CBC: Recent Labs    06/01/18 0934 11/15/18 1007  WBC 4.2 4.3  NEUTROABS 2,360 2,541  HGB 11.0* 10.8*  HCT 34.1* 33.3*  MCV 82.0 83.5  PLT 235 252   Lipid Panel: Recent Labs    06/01/18 0934  CHOL 171  HDL 48*  LDLCALC 104*  TRIG 96  CHOLHDL 3.6   TSH: Recent Labs    06/01/18 0934  TSH 4.42   A1C: No results found for:  HGBA1C   Assessment/Plan 1. Essential hypertension -previously controlled. Does not check blood pressure at home.  Will continue on current regimen with low sodium diet.  - lisinopril (PRINIVIL,ZESTRIL) 5 MG tablet; Take 1 tablet (5 mg total) by mouth daily.  Dispense: 90 tablet; Refill: 1  2. Dementia without behavioral disturbance, unspecified dementia type ([place]) -stable, daughter helps her but she is maintaining her home and goes to appts alone.  - donepezil (ARICEPT) 5 MG tablet; Take 1 tablet (5 mg total) by mouth daily.  Dispense: 90 tablet; Refill: 1  3. Mixed hyperlipidemia -continues on zocor, will follow up lipids at next OV, continue heart healthy diet.   4. Osteoporosis, unspecified osteoporosis type, unspecified pathological fracture presence -continues on prolia injection with cal and  vit d   5. Iron deficiency anemia, unspecified iron deficiency anemia type Stable on last labs, will follow up at next office visit.   6. Adjustment insomnia Reports issues with bad dreams that may be coming from aricept, encouraged to take this with morning medication to see if this improves symptoms.  7. Depression, major, single episode, complete remission ([place]) Stable at this time  Next appt: 06/14/2019 Janett Billow K. Harle Battiest  [facility] [phone]   Follow Up Instructions:    I discussed the assessment and treatment plan with the patient. The patient was provided an opportunity to ask questions and all were answered. The patient agreed with the plan and demonstrated an understanding of the instructions.   The patient was advised to call back or seek an in-person evaluation if the symptoms worsen or if the condition fails to improve as anticipated.  I provided 12  minutes of non-face-to-face time during this encounter.   Lauree Chandler, NP

## 2019-03-23 NOTE — Patient Instructions (Addendum)
Start taking Aricept with morning medication.    Insomnia Insomnia is a sleep disorder that makes it difficult to fall asleep or stay asleep. Insomnia can cause fatigue, low energy, difficulty concentrating, mood swings, and poor performance at work or school. There are three different ways to classify insomnia:  Difficulty falling asleep.  Difficulty staying asleep.  Waking up too early in the morning. Any type of insomnia can be long-term (chronic) or short-term (acute). Both are common. Short-term insomnia usually lasts for three months or less. Chronic insomnia occurs at least three times a week for longer than three months. What are the causes? Insomnia may be caused by another condition, situation, or substance, such as:  Anxiety.  Certain medicines.  Gastroesophageal reflux disease (GERD) or other gastrointestinal conditions.  Asthma or other breathing conditions.  Restless legs syndrome, sleep apnea, or other sleep disorders.  Chronic pain.  Menopause.  Stroke.  Abuse of alcohol, tobacco, or illegal drugs.  Mental health conditions, such as depression.  Caffeine.  Neurological disorders, such as Alzheimer's disease.  An overactive thyroid (hyperthyroidism). Sometimes, the cause of insomnia may not be known. What increases the risk? Risk factors for insomnia include:  Gender. Women are affected more often than men.  Age. Insomnia is more common as you get older.  Stress.  Lack of exercise.  Irregular work schedule or working night shifts.  Traveling between different time zones.  Certain medical and mental health conditions. What are the signs or symptoms? If you have insomnia, the main symptom is having trouble falling asleep or having trouble staying asleep. This may lead to other symptoms, such as:  Feeling fatigued or having low energy.  Feeling nervous about going to sleep.  Not feeling rested in the morning.  Having trouble  concentrating.  Feeling irritable, anxious, or depressed. How is this diagnosed? This condition may be diagnosed based on:  Your symptoms and medical history. Your health care provider may ask about: ? Your sleep habits. ? Any medical conditions you have. ? Your mental health.  A physical exam. How is this treated? Treatment for insomnia depends on the cause. Treatment may focus on treating an underlying condition that is causing insomnia. Treatment may also include:  Medicines to help you sleep.  Counseling or therapy.  Lifestyle adjustments to help you sleep better. Follow these instructions at home: Eating and drinking   Limit or avoid alcohol, caffeinated beverages, and cigarettes, especially close to bedtime. These can disrupt your sleep.  Do not eat a large meal or eat spicy foods right before bedtime. This can lead to digestive discomfort that can make it hard for you to sleep. Sleep habits   Keep a sleep diary to help you and your health care provider figure out what could be causing your insomnia. Write down: ? When you sleep. ? When you wake up during the night. ? How well you sleep. ? How rested you feel the next day. ? Any side effects of medicines you are taking. ? What you eat and drink.  Make your bedroom a dark, comfortable place where it is easy to fall asleep. ? Put up shades or blackout curtains to block light from outside. ? Use a white noise machine to block noise. ? Keep the temperature cool.  Limit screen use before bedtime. This includes: ? Watching TV. ? Using your smartphone, tablet, or computer.  Stick to a routine that includes going to bed and waking up at the same times every day and  night. This can help you fall asleep faster. Consider making a quiet activity, such as reading, part of your nighttime routine.  Try to avoid taking naps during the day so that you sleep better at night.  Get out of bed if you are still awake after 15  minutes of trying to sleep. Keep the lights down, but try reading or doing a quiet activity. When you feel sleepy, go back to bed. General instructions  Take over-the-counter and prescription medicines only as told by your health care provider.  Exercise regularly, as told by your health care provider. Avoid exercise starting several hours before bedtime.  Use relaxation techniques to manage stress. Ask your health care provider to suggest some techniques that may work well for you. These may include: ? Breathing exercises. ? Routines to release muscle tension. ? Visualizing peaceful scenes.  Make sure that you drive carefully. Avoid driving if you feel very sleepy.  Keep all follow-up visits as told by your health care provider. This is important. Contact a health care provider if:  You are tired throughout the day.  You have trouble in your daily routine due to sleepiness.  You continue to have sleep problems, or your sleep problems get worse. Get help right away if:  You have serious thoughts about hurting yourself or someone else. If you ever feel like you may hurt yourself or others, or have thoughts about taking your own life, get help right away. You can go to your nearest emergency department or call:  Your local emergency services (911 in the [place]  A suicide crisis helpline, such as the [place] at 201-216-3665. This is open 24 hours a day. Summary  Insomnia is a sleep disorder that makes it difficult to fall asleep or stay asleep.  Insomnia can be long-term (chronic) or short-term (acute).  Treatment for insomnia depends on the cause. Treatment may focus on treating an underlying condition that is causing insomnia.  Keep a sleep diary to help you and your health care provider figure out what could be causing your insomnia. This information is not intended to replace advice given to you by your health care provider. Make sure you discuss  any questions you have with your health care provider. Document Released: 11/21/2000 Document Revised: 09/03/2017 Document Reviewed: 09/03/2017 Elsevier Interactive Patient Education  2019 [employer].

## 2019-03-25 ENCOUNTER — Ambulatory Visit: Payer: Medicare Other | Admitting: Nurse Practitioner

## 2019-04-03 ENCOUNTER — Encounter: Payer: Self-pay | Admitting: Nurse Practitioner

## 2019-06-06 ENCOUNTER — Ambulatory Visit: Payer: Self-pay

## 2019-06-08 ENCOUNTER — Other Ambulatory Visit: Payer: Self-pay | Admitting: *Deleted

## 2019-06-08 MED ORDER — DENOSUMAB 60 MG/ML ~~LOC~~ SOSY: 60.0000 mg | PREFILLED_SYRINGE | SUBCUTANEOUS | 0 refills | Status: DC

## 2019-06-08 NOTE — Telephone Encounter (Signed)
Per Lattie Haw send Prolia to Pharmacy for patient pick up

## 2019-06-09 ENCOUNTER — Ambulatory Visit: Payer: Self-pay

## 2019-06-09 ENCOUNTER — Encounter: Payer: Self-pay | Admitting: Family

## 2019-06-14 ENCOUNTER — Ambulatory Visit (INDEPENDENT_AMBULATORY_CARE_PROVIDER_SITE_OTHER): Payer: Medicare Other | Admitting: Nurse Practitioner

## 2019-06-14 ENCOUNTER — Encounter: Payer: Self-pay | Admitting: Nurse Practitioner

## 2019-06-14 ENCOUNTER — Other Ambulatory Visit: Payer: Self-pay

## 2019-06-14 VITALS — BP 124/72 | HR 56 | Temp 98.3°F | Ht 67.0 in | Wt 151.0 lb

## 2019-06-14 DIAGNOSIS — D509 Iron deficiency anemia, unspecified: Secondary | ICD-10-CM

## 2019-06-14 DIAGNOSIS — R7989 Other specified abnormal findings of blood chemistry: Secondary | ICD-10-CM

## 2019-06-14 DIAGNOSIS — Z0000 Encounter for general adult medical examination without abnormal findings: Secondary | ICD-10-CM

## 2019-06-14 DIAGNOSIS — E782 Mixed hyperlipidemia: Secondary | ICD-10-CM

## 2019-06-14 DIAGNOSIS — M810 Age-related osteoporosis without current pathological fracture: Secondary | ICD-10-CM | POA: Diagnosis not present

## 2019-06-14 DIAGNOSIS — I10 Essential (primary) hypertension: Secondary | ICD-10-CM

## 2019-06-14 DIAGNOSIS — H6123 Impacted cerumen, bilateral: Secondary | ICD-10-CM

## 2019-06-14 DIAGNOSIS — F0390 Unspecified dementia without behavioral disturbance: Secondary | ICD-10-CM | POA: Diagnosis not present

## 2019-06-14 DIAGNOSIS — Z1231 Encounter for screening mammogram for malignant neoplasm of breast: Secondary | ICD-10-CM

## 2019-06-14 DIAGNOSIS — F5102 Adjustment insomnia: Secondary | ICD-10-CM

## 2019-06-14 MED ORDER — DONEPEZIL HCL 10 MG PO TABS: 10.0000 mg | ORAL_TABLET | Freq: Every day | ORAL | 1 refills | Status: DC

## 2019-06-14 MED ORDER — HYDROCHLOROTHIAZIDE 25 MG PO TABS: 25.0000 mg | ORAL_TABLET | Freq: Every day | ORAL | 1 refills | Status: DC

## 2019-06-14 MED ORDER — DENOSUMAB 60 MG/ML ~~LOC~~ SOSY
60.0000 mg | PREFILLED_SYRINGE | Freq: Once | SUBCUTANEOUS | Status: AC
  Administered 2019-06-14: 09:00:00 60 mg via SUBCUTANEOUS

## 2019-06-14 NOTE — Progress Notes (Signed)
Careteam: Patient Care Team: Lauree Chandler, NP as PCP - General (Geriatric Medicine)  Advanced Directive information    No Known Allergies  Chief Complaint  Patient presents with  . Medical Management of Chronic Issues    3 month follow-up   . Injections    Prolia injection (patient supplied)  . Medication Refill    Aricept and HCTZ      HPI: Patient is a 85 y.o. female seen in the office today routine follow up.   Memory loss- feels like this has gotten worse since COVID, she was in a book club and go on trips, went to center and now staying at home most of the time. Still staying active   Osteoporosis- due for bone density, had prolia injection today, taking cal and vit d and walking.   htn- controlled, lisinopril 5 mg daily and hctz  Weight loss- hard to eat like she should, drinking boost 1 a day.   Insomnia- states trazodone giving her weird dreams. She takes as needed   Review of Systems:  Review of Systems  Constitutional: Negative for chills, fever and weight loss.  HENT: Negative for hearing loss.   Respiratory: Negative for cough, sputum production and shortness of breath.   Cardiovascular: Negative for chest pain, palpitations and leg swelling.  Gastrointestinal: Negative for abdominal pain, constipation, diarrhea and heartburn.  Genitourinary: Negative for dysuria, frequency and urgency.  Musculoskeletal: Negative for back pain, falls, joint pain and myalgias.  Skin: Negative.   Neurological: Negative for dizziness and headaches.  Psychiatric/Behavioral: Negative for depression and memory loss. The patient does not have insomnia.     Past Medical History:  Diagnosis Date  . Hyperlipidemia   . Hypertension   . Osteoporosis    Past Surgical History:  Procedure Laterality Date  . ABDOMINAL HYSTERECTOMY  1998   Social History:   reports that she quit smoking about 12 years ago. She smoked 1.00 pack per day. She has never used smokeless  tobacco. She reports that she does not drink alcohol or use drugs.  Family History  Problem Relation Age of Onset  . Cancer Father   . Dementia Sister   . Breast cancer Maternal Grandmother   . Breast cancer Paternal Grandmother     Medications: Patient's Medications  New Prescriptions   No medications on file  Previous Medications   ASPIRIN 81 MG TABLET    Take 81 mg by mouth daily.   CALCIUM-VITAMIN D (OSCAL 500/200 D-3) 500-200 MG-UNIT PER TABLET    Take 1 tablet by mouth 2 (two) times daily.   DENOSUMAB (PROLIA) 60 MG/ML SOSY INJECTION    Inject 60 mg into the skin every 6 (six) months.   DONEPEZIL (ARICEPT) 5 MG TABLET    Take 1 tablet (5 mg total) by mouth daily.   FEEDING SUPPLEMENT (BOOST HIGH PROTEIN) LIQD    Take 1 Container by mouth daily.   HYDROCHLOROTHIAZIDE (HYDRODIURIL) 25 MG TABLET    Take 1 tablet (25 mg total) by mouth daily.   LISINOPRIL (PRINIVIL,ZESTRIL) 5 MG TABLET    Take 1 tablet (5 mg total) by mouth daily.   SIMVASTATIN (ZOCOR) 20 MG TABLET    Take 1 tablet (20 mg total) by mouth daily.   TRAZODONE (DESYREL) 50 MG TABLET    Take 0.5 tablets (25 mg total) by mouth at bedtime. For sleep  Modified Medications   No medications on file  Discontinued Medications   No medications on file  Physical Exam:  Vitals:   06/14/19 0852  BP: 124/72  Pulse: (!) 56  Temp: 98.3 F (36.8 C)  TempSrc: Oral  SpO2: 97%  Weight: 151 lb (68.5 kg)  Height: 5\' 7"  (1.702 m)   Body mass index is 23.65 kg/m. Wt Readings from Last 3 Encounters:  06/14/19 151 lb (68.5 kg)  06/14/19 151 lb (68.5 kg)  11/15/18 154 lb (69.9 kg)    Physical Exam Constitutional:      Appearance: Normal appearance. She is well-developed.  HENT:     Right Ear: There is impacted cerumen.     Left Ear: There is impacted cerumen.     Mouth/Throat:     Pharynx: No oropharyngeal exudate.  Eyes:     General: No scleral icterus.    Pupils: Pupils are equal, round, and reactive to light.   Neck:     Musculoskeletal: Neck supple.     Thyroid: No thyromegaly.     Vascular: No carotid bruit.     Trachea: No tracheal deviation.  Cardiovascular:     Rate and Rhythm: Normal rate and regular rhythm.     Heart sounds: Murmur (1/6 SEM) present. No friction rub. No gallop.      Comments: No LE edema b/l. no calf TTP.  Pulmonary:     Effort: Pulmonary effort is normal. No respiratory distress.     Breath sounds: Normal breath sounds. No stridor. No wheezing or rales.  Abdominal:     General: Bowel sounds are normal. There is no distension.     Palpations: Abdomen is soft. Abdomen is not rigid. There is no hepatomegaly or mass.     Tenderness: There is no abdominal tenderness. There is no guarding or rebound.     Hernia: No hernia is present.  Lymphadenopathy:     Cervical: No cervical adenopathy.  Skin:    General: Skin is warm and dry.     Findings: No rash.  Neurological:     Mental Status: She is alert and oriented to person, place, and time.     Deep Tendon Reflexes: Reflexes are normal and symmetric.  Psychiatric:        Behavior: Behavior normal.        Thought Content: Thought content normal.     Labs reviewed: Basic Metabolic Panel: Recent Labs    11/15/18 1007  NA 137  K 3.8  CL 102  CO2 30  GLUCOSE 79  BUN 19  CREATININE 1.10*  CALCIUM 9.7   Liver Function Tests: No results for input(s): AST, ALT, ALKPHOS, BILITOT, PROT, ALBUMIN in the last 8760 hours. No results for input(s): LIPASE, AMYLASE in the last 8760 hours. No results for input(s): AMMONIA in the last 8760 hours. CBC: Recent Labs    11/15/18 1007  WBC 4.3  NEUTROABS 2,541  HGB 10.8*  HCT 33.3*  MCV 83.5  PLT 252   Lipid Panel: No results for input(s): CHOL, HDL, LDLCALC, TRIG, CHOLHDL, LDLDIRECT in the last 8760 hours. TSH: No results for input(s): TSH in the last 8760 hours. A1C: No results found for: HGBA1C   Assessment/Plan 1. Dementia without behavioral disturbance,  unspecified dementia type ([place]) -progressive decline, reports it is worse since COVID and she has not been able to get out like she has in the past. Will increase aricept to 10 mg daily at this time. Also to consider starting namenda at follow up to preserve memory.  - donepezil (ARICEPT) 10 MG tablet; Take 1 tablet (10  mg total) by mouth daily.  Dispense: 90 tablet; Refill: 1  2. Osteoporosis, unspecified osteoporosis type, unspecified pathological fracture presence -conts on cal and vit d, encouraged weigh bearing activity 30 mins 5 days a week.  - denosumab (PROLIA) injection 60 mg - DG Bone Density; Future  3. Screening mammogram, encounter for - MM DIGITAL SCREENING BILATERAL; Future  4. HYPERTENSION, BENIGN ESSENTIAL Stable on lisinipril with hctz - hydrochlorothiazide (HYDRODIURIL) 25 MG tablet; Take 1 tablet (25 mg total) by mouth daily.  Dispense: 90 tablet; Refill: 1 - COMPLETE METABOLIC PANEL WITH GFR  5. Mixed hyperlipidemia -continues on zocor 20 mg daily - Lipid Panel - COMPLETE METABOLIC PANEL WITH GFR  6. Iron deficiency anemia, unspecified iron deficiency anemia type Will follow up lab, not currently on supplement.  - CBC with Differential/Platelet - Iron and TIBC  7. Adjustment insomnia Stable, using trazodone as needed with good effects  8. Abnormal TSH -not on supplement at this, will follow up.  - TSH  9. Bilateral cerumen impaction Removed cerumen impaction with ear curette, on the right completely removed. Left ear still had wax but pt reported pain with removal so stopped. Will have her use debrox for 4 days and follow up in office for wash.   Next appt: 06/21/2019 [employer]. [place], [facility] [phone]

## 2019-06-14 NOTE — Progress Notes (Signed)
Subjective:   Jasmine Fletcher is a 85 y.o. female who presents for Medicare Annual (Subsequent) preventive examination.  Review of Systems:   Cardiac Risk Factors include: advanced age (>47men, >69 women);dyslipidemia;hypertension     Objective:     Vitals: BP 124/72   Pulse (!) 56   Temp 98.3 F (36.8 C) (Oral)   Ht 5\' 7"  (1.702 m)   Wt 151 lb (68.5 kg)   SpO2 97%   BMI 23.65 kg/m   Body mass index is 23.65 kg/m.  Advanced Directives 06/14/2019 03/23/2019 11/15/2018 06/04/2018 06/04/2018 07/01/2017 05/27/2017  Does Patient Have a Medical Advance Directive? No Yes;No No No No Yes Yes  Type of Advance Directive - - - - - Catering manager  Does patient want to make changes to medical advance directive? - - - - - No - Patient declined No - Patient declined  Copy of [place] in Chart? - - - - - No - copy requested No - copy requested  Would patient like information on creating a medical advance directive? Yes (MAU/Ambulatory/Procedural Areas - Information given) Yes (MAU/Ambulatory/Procedural Areas - Information given) - Yes (MAU/Ambulatory/Procedural Areas - Information given) Yes (MAU/Ambulatory/Procedural Areas - Information given) - -    Tobacco Social History   Tobacco Use  Smoking Status Former Smoker  . Packs/day: 1.00  . Quit date: 12/08/2006  . Years since quitting: 12.5  Smokeless Tobacco Never Used  Tobacco Comment   Quit t age 5      Counseling given: Not Answered Comment: Quit t age 29    Clinical Intake:  Pre-visit preparation completed: Yes  Pain : No/denies pain     BMI - recorded: 23.65 Nutritional Status: BMI of 19-24  Normal Nutritional Risks: None Diabetes: No  How often do you need to have someone help you when you read instructions, pamphlets, or other written materials from your doctor or pharmacy?: 3 - Sometimes What is the last grade level you completed in school?: 9th grade   Interpreter Needed?: No     Past Medical History:  Diagnosis Date  . Hyperlipidemia   . Hypertension   . Osteoporosis    Past Surgical History:  Procedure Laterality Date  . ABDOMINAL HYSTERECTOMY  1998   Family History  Problem Relation Age of Onset  . Cancer Father   . Dementia Sister   . Breast cancer Maternal Grandmother   . Breast cancer Paternal Grandmother    Social History   Socioeconomic History  . Marital status: Single    Spouse name: Not on file  . Number of children: Not on file  . Years of education: Not on file  . Highest education level: Not on file  Occupational History  . Not on file  Social Needs  . Financial resource strain: Not hard at all  . Food insecurity    Worry: Never true    Inability: Never true  . Transportation needs    Medical: No    Non-medical: No  Tobacco Use  . Smoking status: Former Smoker    Packs/day: 1.00    Quit date: 12/08/2006    Years since quitting: 12.5  . Smokeless tobacco: Never Used  . Tobacco comment: Quit t age 7   Substance and Sexual Activity  . Alcohol use: No    Alcohol/week: 0.0 standard drinks  . Drug use: No  . Sexual activity: Not on file  Lifestyle  . Physical activity  Days per week: 3 days    Minutes per session: 30 min  . Stress: Not at all  Relationships  . Social Herbalist on phone: Three times a week    Gets together: Three times a week    Attends religious service: Never    Active member of club or organization: No    Attends meetings of clubs or organizations: Never    Relationship status: Never married  Other Topics Concern  . Not on file  Social History Narrative  . Not on file    Outpatient Encounter Medications as of 06/14/2019  Medication Sig  . aspirin 81 MG tablet Take 81 mg by mouth daily.  . calcium-vitamin D (OSCAL 500/200 D-3) 500-200 MG-UNIT per tablet Take 1 tablet by mouth 2 (two) times daily.  Marland Kitchen denosumab (PROLIA) 60 MG/ML SOSY injection Inject 60 mg  into the skin every 6 (six) months.  . donepezil (ARICEPT) 5 MG tablet Take 1 tablet (5 mg total) by mouth daily.  . feeding supplement (BOOST HIGH PROTEIN) LIQD Take 1 Container by mouth daily.  . hydrochlorothiazide (HYDRODIURIL) 25 MG tablet Take 1 tablet (25 mg total) by mouth daily.  Marland Kitchen lisinopril (PRINIVIL,ZESTRIL) 5 MG tablet Take 1 tablet (5 mg total) by mouth daily.  . simvastatin (ZOCOR) 20 MG tablet Take 1 tablet (20 mg total) by mouth daily.  . traZODone (DESYREL) 50 MG tablet Take 0.5 tablets (25 mg total) by mouth at bedtime. For sleep   No facility-administered encounter medications on file as of 06/14/2019.     Activities of Daily Living In your present state of health, do you have any difficulty performing the following activities: 06/14/2019  Hearing? N  Vision? N  Difficulty concentrating or making decisions? Y  Walking or climbing stairs? N  Dressing or bathing? N  Doing errands, shopping? N  Preparing Food and eating ? N  Using the Toilet? N  In the past six months, have you accidently leaked urine? N  Do you have problems with loss of bowel control? N  Managing your Medications? N  Managing your Finances? N  Housekeeping or managing your Housekeeping? N  Some recent data might be hidden    Patient Care Team: Lauree Chandler, NP as PCP - General (Geriatric Medicine)    Assessment:   This is a routine wellness examination for [place].  Exercise Activities and Dietary recommendations Current Exercise Habits: Home exercise routine, Type of exercise: walking;calisthenics;strength training/weights, Time (Minutes): 20, Frequency (Times/Week): 7, Weekly Exercise (Minutes/Week): 140, Intensity: Mild  Goals   None     Fall Risk Fall Risk  06/14/2019 03/23/2019 11/15/2018 08/06/2018 06/04/2018  Falls in the past year? 0 0 0 No No  Number falls in past yr: 0 0 - - -  Injury with Fall? 0 0 - - -   Is the patient's home free of loose throw rugs in walkways, pet beds,  electrical cords, etc?   yes      Grab bars in the bathroom? no      Handrails on the stairs?   yes      Adequate lighting?   yes  Timed Get Up and Go performed: na Depression Screen PHQ 2/9 Scores 06/14/2019 03/23/2019 08/06/2018 06/04/2018  PHQ - 2 Score 0 0 0 0  PHQ- 9 Score - - - -     Cognitive Function MMSE - Mini Mental State Exam 06/14/2019 06/04/2018 05/27/2017 03/14/2016 07/25/2014  Not completed: - - - (  No Data) -  Orientation to time 5 5 5 5 5   Orientation to Place 4 5 5 5 5   Registration 3 3 3 3 3   Attention/ Calculation 2 3 0 4 4  Recall 2 2 3 2 2   Language- name 2 objects 2 2 2 2 2   Language- repeat 1 1 1 1 1   Language- follow 3 step command 3 3 3 3 3   Language- read & follow direction 1 1 1 1 1   Write a sentence 1 1 1 1 1   Copy design 0 0 0 0 0  Total score 24 26 24 27 27         Immunization History  Administered Date(s) Administered  . Influenza Whole 12/24/2006  . Influenza,inj,Quad PF,6+ Mos 01/30/2015, 10/02/2017, 08/06/2018  . Influenza-Unspecified 09/18/2011, 08/26/2013, 09/29/2016  . Pneumococcal Conjugate-13 07/25/2014  . Pneumococcal Polysaccharide-23 12/08/1996, 10/16/2011  . Td 11/05/2005  . Tdap 10/16/2011    Qualifies for Shingles Vaccine?yes  Screening Tests Health Maintenance  Topic Date Due  . INFLUENZA VACCINE  07/09/2019  . TETANUS/TDAP  10/15/2021  . DEXA SCAN  Completed  . PNA vac Low Risk Adult  Completed    Cancer Screenings: Lung: Low Dose CT Chest recommended if Age 34-80 years, 30 pack-year currently smoking OR have quit w/in 15years. Patient does not qualify. Breast:  Up to date on Mammogram? No   Up to date of Bone Density/Dexa? No Colorectal: aged out  Additional Screenings:  Hepatitis C Screening: na      Plan:    I have personally reviewed and noted the following in the patient's chart:   . Medical and social history . Use of alcohol, tobacco or illicit drugs  . Current medications and supplements . Functional  ability and status . Nutritional status . Physical activity . Advanced directives . List of other physicians . Hospitalizations, surgeries, and ER visits in previous 12 months . Vitals . Screenings to include cognitive, depression, and falls . Referrals and appointments  In addition, I have reviewed and discussed with patient certain preventive protocols, quality metrics, and best practice recommendations. A written personalized care plan for preventive services as well as general preventive health recommendations were provided to patient.     Lauree Chandler, NP  06/14/2019

## 2019-06-14 NOTE — Patient Instructions (Signed)
Follow up next week to recheck ears 4 days prior to appt apply 5-10 drops of debrox drops into right ear twice daily (4 days max) then come into office to have flushed.   Increase aricept to 10 mg daily for memory- consider starting another medication to help preserve memory loss    Follow up in 4 months for routine follow up

## 2019-06-14 NOTE — Patient Instructions (Signed)
Jasmine Fletcher , Thank you for taking time to come for your Medicare Wellness Visit. I appreciate your ongoing commitment to your health goals. Please review the following plan we discussed and let me know if I can assist you in the future.   Screening recommendations/referrals: Colonoscopy aged out Mammogram order placed Bone Density order placed  Recommended yearly ophthalmology/optometry visit for glaucoma screening and checkup Recommended yearly dental visit for hygiene and checkup  Vaccinations: Influenza vaccine due 07/2019 Pneumococcal vaccine up to date Tdap vaccine up to date Shingles vaccine Rx at your pharmacy, to request to get this there.   Advanced directives: please complete and bring copy to office   Conditions/risks identified: memory loss  Next appointment: 1 year   Preventive Care 47 Years and Older, Female Preventive care refers to lifestyle choices and visits with your health care provider that can promote health and wellness. What does preventive care include?  A yearly physical exam. This is also called an annual well check.  Dental exams once or twice a year.  Routine eye exams. Ask your health care provider how often you should have your eyes checked.  Personal lifestyle choices, including:  Daily care of your teeth and gums.  Regular physical activity.  Eating a healthy diet.  Avoiding tobacco and drug use.  Limiting alcohol use.  Practicing safe sex.  Taking low-dose aspirin every day.  Taking vitamin and mineral supplements as recommended by your health care provider. What happens during an annual well check? The services and screenings done by your health care provider during your annual well check will depend on your age, overall health, lifestyle risk factors, and family history of disease. Counseling  Your health care provider may ask you questions about your:  Alcohol use.  Tobacco use.  Drug use.  Emotional well-being.  Home  and relationship well-being.  Sexual activity.  Eating habits.  History of falls.  Memory and ability to understand (cognition).  Work and work Statistician.  Reproductive health. Screening  You may have the following tests or measurements:  Height, weight, and BMI.  Blood pressure.  Lipid and cholesterol levels. These may be checked every 5 years, or more frequently if you are over 67 years old.  Skin check.  Lung cancer screening. You may have this screening every year starting at age 105 if you have a 30-pack-year history of smoking and currently smoke or have quit within the past 15 years.  Fecal occult blood test (FOBT) of the stool. You may have this test every year starting at age 48.  Flexible sigmoidoscopy or colonoscopy. You may have a sigmoidoscopy every 5 years or a colonoscopy every 10 years starting at age 43.  Hepatitis C blood test.  Hepatitis B blood test.  Sexually transmitted disease (STD) testing.  Diabetes screening. This is done by checking your blood sugar (glucose) after you have not eaten for a while (fasting). You may have this done every 1-3 years.  Bone density scan. This is done to screen for osteoporosis. You may have this done starting at age 78.  Mammogram. This may be done every 1-2 years. Talk to your health care provider about how often you should have regular mammograms. Talk with your health care provider about your test results, treatment options, and if necessary, the need for more tests. Vaccines  Your health care provider may recommend certain vaccines, such as:  Influenza vaccine. This is recommended every year.  Tetanus, diphtheria, and acellular pertussis (Tdap, Td) vaccine.  You may need a Td booster every 10 years.  Zoster vaccine. You may need this after age 13.  Pneumococcal 13-valent conjugate (PCV13) vaccine. One dose is recommended after age 69.  Pneumococcal polysaccharide (PPSV23) vaccine. One dose is recommended  after age 52. Talk to your health care provider about which screenings and vaccines you need and how often you need them. This information is not intended to replace advice given to you by your health care provider. Make sure you discuss any questions you have with your health care provider. Document Released: 12/21/2015 Document Revised: 08/13/2016 Document Reviewed: 09/25/2015 Elsevier Interactive Patient Education  2017 [place] Prevention in the Home Falls can cause injuries. They can happen to people of all ages. There are many things you can do to make your home safe and to help prevent falls. What can I do on the outside of my home?  Regularly fix the edges of walkways and driveways and fix any cracks.  Remove anything that might make you trip as you walk through a door, such as a raised step or threshold.  Trim any bushes or trees on the path to your home.  Use bright outdoor lighting.  Clear any walking paths of anything that might make someone trip, such as rocks or tools.  Regularly check to see if handrails are loose or broken. Make sure that both sides of any steps have handrails.  Any raised decks and porches should have guardrails on the edges.  Have any leaves, snow, or ice cleared regularly.  Use sand or salt on walking paths during winter.  Clean up any spills in your garage right away. This includes oil or grease spills. What can I do in the bathroom?  Use night lights.  Install grab bars by the toilet and in the tub and shower. Do not use towel bars as grab bars.  Use non-skid mats or decals in the tub or shower.  If you need to sit down in the shower, use a plastic, non-slip stool.  Keep the floor dry. Clean up any water that spills on the floor as soon as it happens.  Remove soap buildup in the tub or shower regularly.  Attach bath mats securely with double-sided non-slip rug tape.  Do not have throw rugs and other things on the floor  that can make you trip. What can I do in the bedroom?  Use night lights.  Make sure that you have a light by your bed that is easy to reach.  Do not use any sheets or blankets that are too big for your bed. They should not hang down onto the floor.  Have a firm chair that has side arms. You can use this for support while you get dressed.  Do not have throw rugs and other things on the floor that can make you trip. What can I do in the kitchen?  Clean up any spills right away.  Avoid walking on wet floors.  Keep items that you use a lot in easy-to-reach places.  If you need to reach something above you, use a strong step stool that has a grab bar.  Keep electrical cords out of the way.  Do not use floor polish or wax that makes floors slippery. If you must use wax, use non-skid floor wax.  Do not have throw rugs and other things on the floor that can make you trip. What can I do with my stairs?  Do not leave any  items on the stairs.  Make sure that there are handrails on both sides of the stairs and use them. Fix handrails that are broken or loose. Make sure that handrails are as long as the stairways.  Check any carpeting to make sure that it is firmly attached to the stairs. Fix any carpet that is loose or worn.  Avoid having throw rugs at the top or bottom of the stairs. If you do have throw rugs, attach them to the floor with carpet tape.  Make sure that you have a light switch at the top of the stairs and the bottom of the stairs. If you do not have them, ask someone to add them for you. What else can I do to help prevent falls?  Wear shoes that:  Do not have high heels.  Have rubber bottoms.  Are comfortable and fit you well.  Are closed at the toe. Do not wear sandals.  If you use a stepladder:  Make sure that it is fully opened. Do not climb a closed stepladder.  Make sure that both sides of the stepladder are locked into place.  Ask someone to hold it  for you, if possible.  Clearly mark and make sure that you can see:  Any grab bars or handrails.  First and last steps.  Where the edge of each step is.  Use tools that help you move around (mobility aids) if they are needed. These include:  Canes.  Walkers.  Scooters.  Crutches.  Turn on the lights when you go into a dark area. Replace any light bulbs as soon as they burn out.  Set up your furniture so you have a clear path. Avoid moving your furniture around.  If any of your floors are uneven, fix them.  If there are any pets around you, be aware of where they are.  Review your medicines with your doctor. Some medicines can make you feel dizzy. This can increase your chance of falling. Ask your doctor what other things that you can do to help prevent falls. This information is not intended to replace advice given to you by your health care provider. Make sure you discuss any questions you have with your health care provider. Document Released: 09/20/2009 Document Revised: 05/01/2016 Document Reviewed: 12/29/2014 Elsevier Interactive Patient Education  2017 [employer].

## 2019-06-15 ENCOUNTER — Encounter: Payer: Self-pay | Admitting: Internal Medicine

## 2019-06-16 ENCOUNTER — Other Ambulatory Visit: Payer: Self-pay | Admitting: Nurse Practitioner

## 2019-06-21 ENCOUNTER — Ambulatory Visit (INDEPENDENT_AMBULATORY_CARE_PROVIDER_SITE_OTHER): Payer: Medicare Other | Admitting: Family

## 2019-06-21 ENCOUNTER — Encounter: Payer: Self-pay | Admitting: Family

## 2019-06-21 ENCOUNTER — Other Ambulatory Visit: Payer: Self-pay

## 2019-06-21 VITALS — BP 138/74 | HR 63 | Temp 97.9°F | Ht 67.0 in | Wt 152.0 lb

## 2019-06-21 DIAGNOSIS — R771 Abnormality of globulin: Secondary | ICD-10-CM | POA: Diagnosis not present

## 2019-06-21 DIAGNOSIS — H6122 Impacted cerumen, left ear: Secondary | ICD-10-CM

## 2019-06-21 LAB — COMPLETE METABOLIC PANEL WITH GFR
AG Ratio: 0.5 (calc) — ABNORMAL LOW (ref 1.0–2.5)
ALT: 8 U/L (ref 6–29)
AST: 16 U/L (ref 10–35)
Albumin: 3.5 g/dL — ABNORMAL LOW (ref 3.6–5.1)
Alkaline phosphatase (APISO): 55 U/L (ref 37–153)
BUN/Creatinine Ratio: 31 (calc) — ABNORMAL HIGH (ref 6–22)
BUN: 34 mg/dL — ABNORMAL HIGH (ref 7–25)
CO2: 28 mmol/L (ref 20–32)
Calcium: 9.8 mg/dL (ref 8.6–10.4)
Chloride: 103 mmol/L (ref 98–110)
Creat: 1.08 mg/dL — ABNORMAL HIGH (ref 0.60–0.88)
GFR, Est African American: 54 mL/min/{1.73_m2} — ABNORMAL LOW (ref >=60)
GFR, Est Non African American: 47 mL/min/{1.73_m2} — ABNORMAL LOW (ref >=60)
Globulin: 6.4 g/dL (calc) — ABNORMAL HIGH (ref 1.9–3.7)
Glucose, Bld: 84 mg/dL (ref 65–99)
Potassium: 3.9 mmol/L (ref 3.5–5.3)
Sodium: 138 mmol/L (ref 135–146)
Total Bilirubin: 0.2 mg/dL (ref 0.2–1.2)
Total Protein: 9.9 g/dL — ABNORMAL HIGH (ref 6.1–8.1)

## 2019-06-21 LAB — CBC WITH DIFFERENTIAL/PLATELET
Absolute Monocytes: 529 cells/uL (ref 200–950)
Basophils Absolute: 51 cells/uL (ref 0–200)
Basophils Relative: 1.1 %
Eosinophils Absolute: 18 cells/uL (ref 15–500)
Eosinophils Relative: 0.4 %
HCT: 35.5 % (ref 35.0–45.0)
Hemoglobin: 11.4 g/dL — ABNORMAL LOW (ref 11.7–15.5)
Lymphs Abs: 1076 cells/uL (ref 850–3900)
MCH: 26.8 pg — ABNORMAL LOW (ref 27.0–33.0)
MCHC: 32.1 g/dL (ref 32.0–36.0)
MCV: 83.5 fL (ref 80.0–100.0)
MPV: 10 fL (ref 7.5–12.5)
Monocytes Relative: 11.5 %
Neutro Abs: 2926 cells/uL (ref 1500–7800)
Neutrophils Relative %: 63.6 %
Platelets: 233 10*3/uL (ref 140–400)
RBC: 4.25 10*6/uL (ref 3.80–5.10)
RDW: 13.7 % (ref 11.0–15.0)
Total Lymphocyte: 23.4 %
WBC: 4.6 10*3/uL (ref 3.8–10.8)

## 2019-06-21 LAB — HEPATIC FUNCTION PANEL
AG Ratio: 0.5 (calc) — ABNORMAL LOW (ref 1.0–2.5)
ALT: 8 U/L (ref 6–29)
AST: 16 U/L (ref 10–35)
Albumin: 3.5 g/dL — ABNORMAL LOW (ref 3.6–5.1)
Alkaline phosphatase (APISO): 55 U/L (ref 37–153)
Bilirubin, Direct: 0 mg/dL (ref 0.0–0.2)
Globulin: 6.4 g/dL (calc) — ABNORMAL HIGH (ref 1.9–3.7)
Indirect Bilirubin: 0.2 mg/dL (calc) (ref 0.2–1.2)
Total Bilirubin: 0.2 mg/dL (ref 0.2–1.2)
Total Protein: 9.9 g/dL — ABNORMAL HIGH (ref 6.1–8.1)

## 2019-06-21 LAB — LIPID PANEL
Cholesterol: 149 mg/dL (ref <200)
HDL: 43 mg/dL — ABNORMAL LOW (ref >=50)
LDL Cholesterol (Calc): 85 mg/dL (calc)
Non-HDL Cholesterol (Calc): 106 mg/dL (calc) (ref <130)
Total CHOL/HDL Ratio: 3.5 (calc) (ref <5.0)
Triglycerides: 111 mg/dL (ref <150)

## 2019-06-21 LAB — PROTEIN ELECTROPHORESIS, SERUM
Abnormal Protein Band1: 3.9 g/dL — ABNORMAL HIGH
Albumin ELP: 4.1 g/dL (ref 3.8–4.8)
Alpha 1: 0.3 g/dL (ref 0.2–0.3)
Alpha 2: 1 g/dL — ABNORMAL HIGH (ref 0.5–0.9)
Beta 2: 0.3 g/dL (ref 0.2–0.5)
Beta Globulin: 0.4 g/dL (ref 0.4–0.6)
Gamma Globulin: 4.1 g/dL — ABNORMAL HIGH (ref 0.8–1.7)
Total Protein: 10.1 g/dL — ABNORMAL HIGH (ref 6.1–8.1)

## 2019-06-21 LAB — TEST AUTHORIZATION 2

## 2019-06-21 LAB — TSH: TSH: 5.27 mIU/L — ABNORMAL HIGH (ref 0.40–4.50)

## 2019-06-21 LAB — PATHOLOGIST SMEAR REVIEW

## 2019-06-21 MED ORDER — DEBROX 6.5 % OT SOLN: 5.0000 [drp] | Freq: Two times a day (BID) | OTIC | 0 refills | Status: AC

## 2019-06-21 NOTE — Progress Notes (Signed)
Provider: Madelaine Whipple FNP-C  Lauree Chandler, NP  Patient Care Team: Lauree Chandler, NP as PCP - General (Geriatric Medicine)  Extended Emergency Contact Information Primary Emergency Contact: Rella Larve [place] Phone: [phone] Work Phone: [phone] Relation: Other  Code Status:  Goals of care: Advanced Directive information Advanced Directives 06/14/2019  Does Patient Have a Medical Advance Directive? No  Type of Advance Directive -  Does patient want to make changes to medical advance directive? -  Copy of [place] in Chart? -  Would patient like information on creating a medical advance directive? Yes (MAU/Ambulatory/Procedural Areas - Information given)     Chief Complaint  Patient presents with  . Follow-up    Recheck ears    HPI:  Pt is a 85 y.o. female seen today for an acute visit for evaluation of right ear redness and left  ear cerumen impaction.right ear cerumen removed on previous visit by PCP recommended evaluation of right ear redness.she denies any decreased hearing.she states right ear is itchy at times but not painful.No drainage from ear.Also denies any fever or chills.she did not use any debrox on left ear prior to this visit. Her PCP also requested patient to collect  Urine protein electrophoresis (UPEP) this visit to rule out Multiple myeloma due to patient recent elevated serum Globulin 6.4 (06/14/2019) previous level  6.2 (06/02/2019).    Past Medical History:  Diagnosis Date  . Hyperlipidemia   . Hypertension   . Osteoporosis    Past Surgical History:  Procedure Laterality Date  . ABDOMINAL HYSTERECTOMY  1998    No Known Allergies  Outpatient Encounter Medications as of 06/21/2019  Medication Sig  . aspirin 81 MG tablet Take 81 mg by mouth daily.  . calcium-vitamin D (OSCAL 500/200 D-3) 500-200 MG-UNIT per tablet Take 1 tablet by mouth 2 (two) times daily.  Marland Kitchen denosumab (PROLIA) 60  MG/ML SOSY injection Inject 60 mg into the skin every 6 (six) months.  . donepezil (ARICEPT) 10 MG tablet Take 1 tablet (10 mg total) by mouth daily.  . feeding supplement (BOOST HIGH PROTEIN) LIQD Take 1 Container by mouth daily.  . hydrochlorothiazide (HYDRODIURIL) 25 MG tablet Take 1 tablet (25 mg total) by mouth daily.  Marland Kitchen lisinopril (PRINIVIL,ZESTRIL) 5 MG tablet Take 1 tablet (5 mg total) by mouth daily.  . simvastatin (ZOCOR) 20 MG tablet Take 1 tablet (20 mg total) by mouth daily.  . traZODone (DESYREL) 50 MG tablet Take 0.5 tablets (25 mg total) by mouth at bedtime. For sleep   No facility-administered encounter medications on file as of 06/21/2019.     Review of Systems  Constitutional: Negative for appetite change, chills, fatigue and fever.  HENT: Negative for congestion, hearing loss, postnasal drip, rhinorrhea, sinus pressure, sinus pain, sneezing and sore throat.   Eyes: Positive for visual disturbance. Negative for pain, discharge, redness and itching.       Ears eye glasses   Respiratory: Negative for cough, chest tightness, shortness of breath and wheezing.   Cardiovascular: Positive for leg swelling. Negative for chest pain and palpitations.  Gastrointestinal: Negative for abdominal distention, abdominal pain, constipation, diarrhea, nausea and vomiting.  Genitourinary: Negative for difficulty urinating, dysuria, flank pain, frequency and urgency.  Musculoskeletal: Negative for arthralgias and gait problem.  Skin: Negative for color change, pallor and rash.  Neurological: Negative for dizziness, weakness, light-headedness and headaches.  Psychiatric/Behavioral: Negative for agitation, confusion and sleep disturbance. The patient is not nervous/anxious.  Immunization History  Administered Date(s) Administered  . Influenza Whole 12/24/2006  . Influenza,inj,Quad PF,6+ Mos 01/30/2015, 10/02/2017, 08/06/2018  . Influenza-Unspecified 09/18/2011, 08/26/2013, 09/29/2016  .  Pneumococcal Conjugate-13 07/25/2014  . Pneumococcal Polysaccharide-23 12/08/1996, 10/16/2011  . Td 11/05/2005  . Tdap 10/16/2011   Pertinent  Health Maintenance Due  Topic Date Due  . INFLUENZA VACCINE  07/09/2019  . DEXA SCAN  Completed  . PNA vac Low Risk Adult  Completed   Fall Risk  06/21/2019 06/14/2019 03/23/2019 11/15/2018 08/06/2018  Falls in the past year? 0 0 0 0 No  Number falls in past yr: 0 0 0 - -  Injury with Fall? 0 0 0 - -    Vitals:   06/21/19 0906  BP: 138/74  Pulse: 63  Temp: 97.9 F (36.6 C)  TempSrc: Oral  SpO2: 98%  Weight: 152 lb (68.9 kg)  Height: 5' 7"  (1.702 m)   Body mass index is 23.81 kg/m. Physical Exam Vitals signs reviewed.  Constitutional:      General: She is not in acute distress.    Appearance: She is normal weight. She is not ill-appearing.  HENT:     Head: Normocephalic.     Right Ear: Tympanic membrane normal. No drainage or swelling.     Left Ear: There is no impacted cerumen.     Ears:     Comments: Right ear canal small area redness noted.Had cerumen removed on previous visit.No pain or tenderness reported.   Left ear cerumen moderate amounts of cerumen removed with Alligator forceps after five drops of peroxide instil to soften cerumen and lavaged with warm water after five drops of peroxide instil to soften cerumen.large amounts of cerumen still persist with ear lavage.patient instructed to apply debrox solution 4 days prior to next visit then PCP to lavage with warm water.      Nose: No congestion or rhinorrhea.     Mouth/Throat:     Mouth: Mucous membranes are moist.     Pharynx: Oropharynx is clear. No oropharyngeal exudate or posterior oropharyngeal erythema.  Eyes:     General: No scleral icterus.       Right eye: No discharge.        Left eye: No discharge.     Conjunctiva/sclera: Conjunctivae normal.     Pupils: Pupils are equal, round, and reactive to light.  Neck:     Musculoskeletal: Normal range of motion. No  neck rigidity or muscular tenderness.  Cardiovascular:     Rate and Rhythm: Normal rate and regular rhythm.     Pulses: Normal pulses.     Heart sounds: Murmur present. No friction rub. No gallop.   Pulmonary:     Effort: Pulmonary effort is normal. No respiratory distress.     Breath sounds: Normal breath sounds. No wheezing, rhonchi or rales.  Chest:     Chest wall: No tenderness.  Abdominal:     General: Bowel sounds are normal. There is no distension.     Palpations: Abdomen is soft. There is no mass.     Tenderness: There is no abdominal tenderness. There is no right CVA tenderness, left CVA tenderness, guarding or rebound.  Musculoskeletal: Normal range of motion.        General: No swelling or tenderness.     Comments: Bilateral lower extremities trace edema.   Lymphadenopathy:     Cervical: No cervical adenopathy.  Skin:    General: Skin is warm and dry.     Coloration:  Skin is not pale.     Findings: No bruising, erythema or rash.     Comments: Right ear canal small red area noted.  Neurological:     Mental Status: She is alert and oriented to person, place, and time.     Cranial Nerves: No cranial nerve deficit.     Sensory: No sensory deficit.     Motor: No weakness.     Gait: Gait normal.  Psychiatric:        Mood and Affect: Mood normal.        Behavior: Behavior normal.        Thought Content: Thought content normal.        Judgment: Judgment normal.    Labs reviewed: Recent Labs    11/15/18 1007 06/14/19 0948  NA 137 138  K 3.8 3.9  CL 102 103  CO2 30 28  GLUCOSE 79 84  BUN 19 34*  CREATININE 1.10* 1.08*  CALCIUM 9.7 9.8   Recent Labs    06/14/19 0948  AST 16  16  ALT 8  8  BILITOT 0.2  0.2  PROT 9.9*  9.9*   Recent Labs    11/15/18 1007 06/14/19 0948  WBC 4.3 4.6  NEUTROABS 2,541 2,926  HGB 10.8* 11.4*  HCT 33.3* 35.5  MCV 83.5 83.5  PLT 252 233   Lab Results  Component Value Date   TSH 5.27 (H) 06/14/2019   No results  found for: HGBA1C Lab Results  Component Value Date   CHOL 149 06/14/2019   HDL 43 (L) 06/14/2019   LDLCALC 85 06/14/2019   TRIG 111 06/14/2019   CHOLHDL 3.5 06/14/2019    Significant Diagnostic Results in last 30 days:  No results found.  Assessment/Plan 1. Elevated serum globulin level serum Globulin 6.4 (06/14/2019) previous level  6.2 (06/02/2019).  Will order UPEP per PCP request.   - Urine Protein, Total and Electrophoresis    2. Left ear impacted cerumen Afebrile. moderate amounts of cerumen removed with Alligator forceps after five drops of peroxide instil to soften cerumen and lavaged with warm water.large amounts of cerumen still persist with earpatient instructed to apply debrox solution 4 days prior to next visit then PCP to lavage with warm water.   Family/ staff Communication: Reviewed plan of care with patient  Labs/tests ordered: Urine protein Electrophoresis(UPEP).  Sandrea Hughs, NP

## 2019-06-21 NOTE — Patient Instructions (Signed)
Debrox 6.5 % otic solution instil 5 drops into left ear twice daily x 4 days then follow up for ear lavage on next visit with Janett Billow

## 2019-06-23 LAB — PROTEIN ELECTROPHORESIS,RANDOM URN
Abnormal Protein Band: 1 mg/dL — ABNORMAL HIGH
Abnormal Protein Band: 7 mg/dL — ABNORMAL HIGH
Albumin: 44 %
Alpha-1-Globulin, U: 3 %
Alpha-2-Globulin, U: 12 %
Beta Globulin, U: 9 %
Creatinine, Urine: 139 mg/dL (ref 20–275)
Gamma Globulin, U: 33 %
Protein/Creat Ratio: 209 mg/g creat — ABNORMAL HIGH (ref 21–161)
Protein/Creatinine Ratio: 0.209 mg/mg creat — ABNORMAL HIGH (ref 0.021–0.16)
Total Protein, Urine: 29 mg/dL — ABNORMAL HIGH (ref 5–24)

## 2019-06-30 ENCOUNTER — Other Ambulatory Visit: Payer: Self-pay

## 2019-06-30 DIAGNOSIS — R771 Abnormality of globulin: Secondary | ICD-10-CM

## 2019-07-12 ENCOUNTER — Other Ambulatory Visit: Payer: Self-pay | Admitting: Nurse Practitioner

## 2019-07-12 DIAGNOSIS — F5102 Adjustment insomnia: Secondary | ICD-10-CM

## 2019-07-27 NOTE — Progress Notes (Signed)
HEMATOLOGY/ONCOLOGY CONSULTATION NOTE  Date of Service: 07/27/2019  Patient Care Team: Lauree Chandler, NP as PCP - General (Geriatric Medicine)  CHIEF COMPLAINTS/PURPOSE OF CONSULTATION:  Elevated serum globulin levels/Monoclonal paraproteinemia concerning for plasma cell dyscrasia  HISTORY OF PRESENTING ILLNESS:   Jasmine Fletcher is a wonderful 85 y.o. female who has been referred to [place] by Sherrie Mustache for evaluation and management of elevated serum globulin levels. The pt reports that she is doing well overall.  The pt reports that her appetite has decreased recently but that she eats a healthy diet. She notes mild fatigue. Denies new bone pain, back pain, hip pain, infection concerns, and any other new concerns.  She has been getting a Prolia shot every 6 months for the past 2 years. She has also been taking Aricept but notes that her memory is worsening. Her daughter is her health care POA, but the pt makes her own healthcare decisions at this time.  Most recent lab results (06/14/2019) of CBC is as follows: all values are WNL except for HGB at 11.4, MCH at 26.8. 06/21/2019 UPEP revealed protein/creat ratio at 209, total protein at 29, and two abnormal protein bands 06/14/2019 SPEP revealed total protein at 10.1, Alpha 2 at 1.0, gamma globulin at 4.1, abnormal protein band1 at 3.9  On review of systems, pt reports some fatigue, decreased appetite, and denies new bone pain, back pain, hip pain, infection concerns, and any other symptoms.   On PMHx the pt reports that she is taking BP medication On Social Hx the pt reports that she quit smoking in 2004 and does not drink alcohol  MEDICAL HISTORY:  Past Medical History:  Diagnosis Date  . Hyperlipidemia   . Hypertension   . Osteoporosis     SURGICAL HISTORY: Past Surgical History:  Procedure Laterality Date  . ABDOMINAL HYSTERECTOMY  1998    SOCIAL HISTORY: Social History   Socioeconomic History  . Marital  status: Single    Spouse name: Not on file  . Number of children: Not on file  . Years of education: Not on file  . Highest education level: Not on file  Occupational History  . Not on file  Social Needs  . Financial resource strain: Not hard at all  . Food insecurity    Worry: Never true    Inability: Never true  . Transportation needs    Medical: No    Non-medical: No  Tobacco Use  . Smoking status: Former Smoker    Packs/day: 1.00    Quit date: 12/08/2006    Years since quitting: 12.6  . Smokeless tobacco: Never Used  . Tobacco comment: Quit t age 49   Substance and Sexual Activity  . Alcohol use: No    Alcohol/week: 0.0 standard drinks  . Drug use: No  . Sexual activity: Not on file  Lifestyle  . Physical activity    Days per week: 3 days    Minutes per session: 30 min  . Stress: Not at all  Relationships  . Social Herbalist on phone: Three times a week    Gets together: Three times a week    Attends religious service: Never    Active member of club or organization: No    Attends meetings of clubs or organizations: Never    Relationship status: Never married  . Intimate partner violence    Fear of current or ex partner: No    Emotionally abused: No  Physically abused: No    Forced sexual activity: No  Other Topics Concern  . Not on file  Social History Narrative  . Not on file    FAMILY HISTORY: Family History  Problem Relation Age of Onset  . Cancer Father   . Dementia Sister   . Breast cancer Maternal Grandmother   . Breast cancer Paternal Grandmother     ALLERGIES:  has No Known Allergies.  MEDICATIONS:  Current Outpatient Medications  Medication Sig Dispense Refill  . aspirin 81 MG tablet Take 81 mg by mouth daily.    . calcium-vitamin D (OSCAL 500/200 D-3) 500-200 MG-UNIT per tablet Take 1 tablet by mouth 2 (two) times daily. 60 tablet 3  . denosumab (PROLIA) 60 MG/ML SOSY injection Inject 60 mg into the skin every 6 (six) months.  1 mL 0  . donepezil (ARICEPT) 10 MG tablet Take 1 tablet (10 mg total) by mouth daily. 90 tablet 1  . feeding supplement (BOOST HIGH PROTEIN) LIQD Take 1 Container by mouth daily.    . hydrochlorothiazide (HYDRODIURIL) 25 MG tablet Take 1 tablet (25 mg total) by mouth daily. 90 tablet 1  . lisinopril (PRINIVIL,ZESTRIL) 5 MG tablet Take 1 tablet (5 mg total) by mouth daily. 90 tablet 1  . simvastatin (ZOCOR) 20 MG tablet Take 1 tablet (20 mg total) by mouth daily. 90 tablet 1  . traZODone (DESYREL) 50 MG tablet TAKE 1/2 TABLET(25 MG) BY MOUTH AT BEDTIME FOR SLEEP 45 tablet 1   No current facility-administered medications for this visit.     REVIEW OF SYSTEMS:    A 10+ POINT REVIEW OF SYSTEMS WAS OBTAINED including neurology, dermatology, psychiatry, cardiac, respiratory, lymph, extremities, GI, GU, Musculoskeletal, constitutional, breasts, reproductive, HEENT.  All pertinent positives are noted in the HPI.  All others are negative.   PHYSICAL EXAMINATION: ECOG PERFORMANCE STATUS: 2 - Symptomatic, <50% confined to bed  . Vitals:   07/28/19 1018  BP: (!) 162/78  Pulse: 64  Resp: 18  Temp: 98.9 F (37.2 C)  SpO2: 99%   Filed Weights   07/28/19 1018  Weight: 150 lb 1.6 oz (68.1 kg)   .Body mass index is 23.51 kg/m.  GENERAL:alert, in no acute distress and comfortable SKIN: no acute rashes, no significant lesions EYES: conjunctiva are pink and non-injected, sclera anicteric OROPHARYNX: MMM, no exudates, no oropharyngeal erythema or ulceration NECK: supple, no JVD LYMPH:  no palpable lymphadenopathy in the cervical, axillary or inguinal regions LUNGS: clear to auscultation b/l with normal respiratory effort HEART: regular rate & rhythm ABDOMEN:  normoactive bowel sounds , non tender, not distended. Extremity: no pedal edema PSYCH: alert & oriented x 3 with fluent speech NEURO: no focal motor/sensory deficits  LABORATORY DATA:  I have reviewed the data as listed  . CBC  Latest Ref Rng & Units 07/28/2019 06/14/2019 11/15/2018  WBC 4.0 - 10.5 K/uL 5.2 4.6 4.3  Hemoglobin 12.0 - 15.0 g/dL 10.8(L) 11.4(L) 10.8(L)  Hematocrit 36.0 - 46.0 % 34.5(L) 35.5 33.3(L)  Platelets 150 - 400 K/uL 283 233 252    . CMP Latest Ref Rng & Units 07/28/2019 06/14/2019 06/14/2019  Glucose 70 - 99 mg/dL 83 - -  BUN 8 - 23 mg/dL 25(H) - -  Creatinine 0.44 - 1.00 mg/dL 1.15(H) - -  Sodium 135 - 145 mmol/L 137 - -  Potassium 3.5 - 5.1 mmol/L 3.9 - -  Chloride 98 - 111 mmol/L 103 - -  CO2 22 - 32 mmol/L 25 - -  Calcium 8.9 - 10.3 mg/dL 9.7 - -  Total Protein 6.5 - 8.1 g/dL 11.1(H) 10.1(H) 9.9(H)  Total Bilirubin 0.3 - 1.2 mg/dL 0.2(L) - 0.2  Alkaline Phos 38 - 126 U/L 64 - -  AST 15 - 41 U/L 17 - 16  ALT 0 - 44 U/L 9 - 8   06/14/2019 SPEP:   RADIOGRAPHIC STUDIES: I have personally reviewed the radiological images as listed and agreed with the findings in the report. No results found.  ASSESSMENT & PLAN:   #1 Monoclonal paraproteinemia with M protein of 3.9 -highly concerning for MM -06/14/2019 SPEP revealed total protein at 10.1, alpha 2 at 1.0, gamma globulin at 4.1, Abnormal protein band1 at 3.9  PLAN: -Discussed patient's most recent labs from 06/14/2019 and 06/21/2019, gamma globulin at 4.1, abnormal protein band1 at 3.9 -Discussed the CRAB criteria with the pt: today's labs will determine whether the pt meets criteria for MM -Discussed the need to r/o MM with a full work up -Will order lab work today and 24 hr UPEP -Will schedule PET/CT in 1 week -Will schedule BM Bx in 1 week -Will see the pt back in 2 weeks  2.  Patient Active Problem List   Diagnosis Date Noted  . Dementia without behavioral disturbance ([place]) 07/01/2017  . Mixed hyperlipidemia 11/29/2013  . Osteoporosis 03/15/2013  . BRADYCARDIA 02/17/2009  . HYPERTENSION, BENIGN ESSENTIAL 02/16/2009  . ANEMIA, HYPOCHROMIC 07/26/2007  . DISORDER, TOBACCO USE 07/26/2007  . DEPRESSION 07/26/2007  . CARDIAC  CATHETERIZATION, LEFT, HX OF 07/26/2007   -Mx of chronic issues with PCP  -Labs today -PET/CT in 1 week -CT bone marrow biopsy in 1 week -RTC with Dr Irene Limbo in 2 weeks (Patient has dementia will need to have exception for daughter to be present in person for clinic followup)   No orders of the defined types were placed in this encounter.   All of the patients questions were answered with apparent satisfaction. The patient knows to call the clinic with any problems, questions or concerns.  I spent 35 counseling the patient face to face. The total time spent in the appointment was 72mns and more than 50% was on counseling and direct patient cares.  GSullivan LoneMD MS AAHIVMS SPuyallup Endoscopy CenterCInsight Surgery And Laser Center LLCHematology/Oncology Physician [facility] (Office):       [phone](Work cell):  [phone](Fax):           [phone] 07/27/2019 8:46 AM  I, EDe Burrs am acting as a scribe for Dr. KIrene Limbo .I have reviewed the above documentation for accuracy and completeness, and I agree with the above. .Brunetta GeneraMD

## 2019-07-28 ENCOUNTER — Inpatient Hospital Stay: Payer: Medicare Other | Attending: Hematology | Admitting: Hematology

## 2019-07-28 ENCOUNTER — Other Ambulatory Visit: Payer: Self-pay

## 2019-07-28 ENCOUNTER — Inpatient Hospital Stay: Payer: Medicare Other

## 2019-07-28 VITALS — BP 162/78 | HR 64 | Temp 98.9°F | Resp 18 | Ht 67.0 in | Wt 150.1 lb

## 2019-07-28 DIAGNOSIS — D472 Monoclonal gammopathy: Secondary | ICD-10-CM

## 2019-07-28 DIAGNOSIS — C9000 Multiple myeloma not having achieved remission: Secondary | ICD-10-CM

## 2019-07-28 DIAGNOSIS — E782 Mixed hyperlipidemia: Secondary | ICD-10-CM

## 2019-07-28 DIAGNOSIS — D649 Anemia, unspecified: Secondary | ICD-10-CM

## 2019-07-28 DIAGNOSIS — Z87891 Personal history of nicotine dependence: Secondary | ICD-10-CM

## 2019-07-28 DIAGNOSIS — M818 Other osteoporosis without current pathological fracture: Secondary | ICD-10-CM | POA: Diagnosis not present

## 2019-07-28 DIAGNOSIS — I10 Essential (primary) hypertension: Secondary | ICD-10-CM | POA: Diagnosis not present

## 2019-07-28 LAB — CBC WITH DIFFERENTIAL/PLATELET
Abs Immature Granulocytes: 0.02 10*3/uL (ref 0.00–0.07)
Basophils Absolute: 0.1 10*3/uL (ref 0.0–0.1)
Basophils Relative: 1 %
Eosinophils Absolute: 0 10*3/uL (ref 0.0–0.5)
Eosinophils Relative: 0 %
HCT: 34.5 % — ABNORMAL LOW (ref 36.0–46.0)
Hemoglobin: 10.8 g/dL — ABNORMAL LOW (ref 12.0–15.0)
Immature Granulocytes: 0 %
Lymphocytes Relative: 27 %
Lymphs Abs: 1.4 10*3/uL (ref 0.7–4.0)
MCH: 27.1 pg (ref 26.0–34.0)
MCHC: 31.3 g/dL (ref 30.0–36.0)
MCV: 86.7 fL (ref 80.0–100.0)
Monocytes Absolute: 0.5 10*3/uL (ref 0.1–1.0)
Monocytes Relative: 10 %
Neutro Abs: 3.2 10*3/uL (ref 1.7–7.7)
Neutrophils Relative %: 62 %
Platelets: 283 10*3/uL (ref 150–400)
RBC: 3.98 MIL/uL (ref 3.87–5.11)
RDW: 14.4 % (ref 11.5–15.5)
WBC: 5.2 10*3/uL (ref 4.0–10.5)
nRBC: 0 % (ref 0.0–0.2)

## 2019-07-28 LAB — SEDIMENTATION RATE: Sed Rate: 110 mm/hr — ABNORMAL HIGH (ref 0–22)

## 2019-07-28 LAB — CMP (CANCER CENTER ONLY)
ALT: 9 U/L (ref 0–44)
AST: 17 U/L (ref 15–41)
Albumin: 3.5 g/dL (ref 3.5–5.0)
Alkaline Phosphatase: 64 U/L (ref 38–126)
Anion gap: 9 (ref 5–15)
BUN: 25 mg/dL — ABNORMAL HIGH (ref 8–23)
CO2: 25 mmol/L (ref 22–32)
Calcium: 9.7 mg/dL (ref 8.9–10.3)
Chloride: 103 mmol/L (ref 98–111)
Creatinine: 1.15 mg/dL — ABNORMAL HIGH (ref 0.44–1.00)
GFR, Est AFR Am: 50 mL/min — ABNORMAL LOW (ref >60)
GFR, Estimated: 43 mL/min — ABNORMAL LOW (ref >60)
Glucose, Bld: 83 mg/dL (ref 70–99)
Potassium: 3.9 mmol/L (ref 3.5–5.1)
Sodium: 137 mmol/L (ref 135–145)
Total Bilirubin: 0.2 mg/dL — ABNORMAL LOW (ref 0.3–1.2)
Total Protein: 11.1 g/dL — ABNORMAL HIGH (ref 6.5–8.1)

## 2019-07-28 LAB — FERRITIN: Ferritin: 154 ng/mL (ref 11–307)

## 2019-07-28 LAB — LACTATE DEHYDROGENASE: LDH: 135 U/L (ref 98–192)

## 2019-07-28 LAB — IRON AND TIBC
Iron: 39 ug/dL — ABNORMAL LOW (ref 41–142)
Saturation Ratios: 16 % — ABNORMAL LOW (ref 21–57)
TIBC: 248 ug/dL (ref 236–444)
UIBC: 208 ug/dL (ref 120–384)

## 2019-07-28 LAB — VITAMIN B12: Vitamin B-12: 709 pg/mL (ref 180–914)

## 2019-07-29 ENCOUNTER — Other Ambulatory Visit: Payer: Self-pay | Admitting: Nurse Practitioner

## 2019-07-29 DIAGNOSIS — E782 Mixed hyperlipidemia: Secondary | ICD-10-CM

## 2019-07-29 LAB — MULTIPLE MYELOMA PANEL, SERUM
Albumin SerPl Elph-Mcnc: 4.1 g/dL (ref 2.9–4.4)
Albumin/Glob SerPl: 0.7 (ref 0.7–1.7)
Alpha 1: 0.3 g/dL (ref 0.0–0.4)
Alpha2 Glob SerPl Elph-Mcnc: 0.8 g/dL (ref 0.4–1.0)
B-Globulin SerPl Elph-Mcnc: 1.1 g/dL (ref 0.7–1.3)
Gamma Glob SerPl Elph-Mcnc: 3.9 g/dL — ABNORMAL HIGH (ref 0.4–1.8)
Globulin, Total: 6.1 g/dL — ABNORMAL HIGH (ref 2.2–3.9)
IgA: 99 mg/dL (ref 64–422)
IgG (Immunoglobin G), Serum: 5351 mg/dL — ABNORMAL HIGH (ref 586–1602)
IgM (Immunoglobulin M), Srm: 15 mg/dL — ABNORMAL LOW (ref 26–217)
M Protein SerPl Elph-Mcnc: 3.4 g/dL — ABNORMAL HIGH
Total Protein ELP: 10.2 g/dL — ABNORMAL HIGH (ref 6.0–8.5)

## 2019-07-29 LAB — KAPPA/LAMBDA LIGHT CHAINS
Kappa free light chain: 412 mg/L — ABNORMAL HIGH (ref 3.3–19.4)
Kappa, lambda light chain ratio: 51.5 — ABNORMAL HIGH (ref 0.26–1.65)
Lambda free light chains: 8 mg/L (ref 5.7–26.3)

## 2019-08-01 ENCOUNTER — Other Ambulatory Visit: Payer: Self-pay

## 2019-08-01 ENCOUNTER — Other Ambulatory Visit: Payer: Self-pay | Admitting: Hematology

## 2019-08-01 DIAGNOSIS — D472 Monoclonal gammopathy: Secondary | ICD-10-CM | POA: Diagnosis not present

## 2019-08-01 DIAGNOSIS — C9000 Multiple myeloma not having achieved remission: Secondary | ICD-10-CM

## 2019-08-02 LAB — UPEP/UIFE/LIGHT CHAINS/TP, 24-HR UR
% BETA, Urine: 21.4 %
ALPHA 1 URINE: 1.5 %
Albumin, U: 19.7 %
Alpha 2, Urine: 6 %
Free Kappa Lt Chains,Ur: 383.74 mg/L — ABNORMAL HIGH (ref 0.63–113.79)
Free Kappa/Lambda Ratio: 564.32 — ABNORMAL HIGH (ref 1.03–31.76)
Free Lambda Lt Chains,Ur: 0.68 mg/L (ref 0.47–11.77)
GAMMA GLOBULIN URINE: 51.4 %
M-SPIKE %, Urine: 28.3 % — ABNORMAL HIGH
M-Spike, Mg/24 Hr: 51 mg/24 hr — ABNORMAL HIGH
Total Protein, Urine-Ur/day: 180 mg/24 hr — ABNORMAL HIGH (ref 30–150)
Total Protein, Urine: 8.2 mg/dL
Total Volume: 2200

## 2019-08-04 ENCOUNTER — Other Ambulatory Visit: Payer: Self-pay | Admitting: Radiology

## 2019-08-05 ENCOUNTER — Ambulatory Visit (HOSPITAL_COMMUNITY)
Admission: RE | Admit: 2019-08-05 | Discharge: 2019-08-05 | Disposition: A | Discharge status: Home / Self Care | Payer: Medicare Other | Source: Ambulatory Visit | Attending: Hematology | Admitting: Hematology

## 2019-08-05 ENCOUNTER — Encounter (HOSPITAL_COMMUNITY): Payer: Self-pay

## 2019-08-05 ENCOUNTER — Other Ambulatory Visit: Payer: Self-pay

## 2019-08-05 DIAGNOSIS — I10 Essential (primary) hypertension: Secondary | ICD-10-CM | POA: Insufficient documentation

## 2019-08-05 DIAGNOSIS — C9000 Multiple myeloma not having achieved remission: Secondary | ICD-10-CM

## 2019-08-05 DIAGNOSIS — Z87891 Personal history of nicotine dependence: Secondary | ICD-10-CM | POA: Insufficient documentation

## 2019-08-05 DIAGNOSIS — Z7982 Long term (current) use of aspirin: Secondary | ICD-10-CM | POA: Diagnosis not present

## 2019-08-05 DIAGNOSIS — D472 Monoclonal gammopathy: Secondary | ICD-10-CM | POA: Insufficient documentation

## 2019-08-05 DIAGNOSIS — M810 Age-related osteoporosis without current pathological fracture: Secondary | ICD-10-CM | POA: Diagnosis not present

## 2019-08-05 DIAGNOSIS — D649 Anemia, unspecified: Secondary | ICD-10-CM | POA: Diagnosis not present

## 2019-08-05 DIAGNOSIS — Z79899 Other long term (current) drug therapy: Secondary | ICD-10-CM | POA: Insufficient documentation

## 2019-08-05 DIAGNOSIS — E785 Hyperlipidemia, unspecified: Secondary | ICD-10-CM | POA: Diagnosis not present

## 2019-08-05 LAB — CBC WITH DIFFERENTIAL/PLATELET
Abs Immature Granulocytes: 0.02 10*3/uL (ref 0.00–0.07)
Basophils Absolute: 0 10*3/uL (ref 0.0–0.1)
Basophils Relative: 1 %
Eosinophils Absolute: 0 10*3/uL (ref 0.0–0.5)
Eosinophils Relative: 1 %
HCT: 38 % (ref 36.0–46.0)
Hemoglobin: 11.6 g/dL — ABNORMAL LOW (ref 12.0–15.0)
Immature Granulocytes: 0 %
Lymphocytes Relative: 31 %
Lymphs Abs: 1.5 10*3/uL (ref 0.7–4.0)
MCH: 27.3 pg (ref 26.0–34.0)
MCHC: 30.5 g/dL (ref 30.0–36.0)
MCV: 89.4 fL (ref 80.0–100.0)
Monocytes Absolute: 0.6 10*3/uL (ref 0.1–1.0)
Monocytes Relative: 12 %
Neutro Abs: 2.6 10*3/uL (ref 1.7–7.7)
Neutrophils Relative %: 55 %
Platelets: 209 10*3/uL (ref 150–400)
RBC: 4.25 MIL/uL (ref 3.87–5.11)
RDW: 14.2 % (ref 11.5–15.5)
WBC: 4.7 10*3/uL (ref 4.0–10.5)
nRBC: 0 % (ref 0.0–0.2)

## 2019-08-05 LAB — PROTIME-INR
INR: 1.1 (ref 0.8–1.2)
Prothrombin Time: 14 seconds (ref 11.4–15.2)

## 2019-08-05 MED ORDER — FLUMAZENIL 0.5 MG/5ML IV SOLN
INTRAVENOUS | Status: AC
  Filled 2019-08-05: qty 5

## 2019-08-05 MED ORDER — LIDOCAINE HCL (PF) 1 % IJ SOLN
INTRAMUSCULAR | Status: AC | PRN
  Administered 2019-08-05: 10 mL

## 2019-08-05 MED ORDER — MIDAZOLAM HCL 2 MG/2ML IJ SOLN
INTRAMUSCULAR | Status: AC | PRN
  Administered 2019-08-05: 1 mg via INTRAVENOUS
  Administered 2019-08-05: 0.5 mg via INTRAVENOUS

## 2019-08-05 MED ORDER — NALOXONE HCL 0.4 MG/ML IJ SOLN
INTRAMUSCULAR | Status: AC
  Filled 2019-08-05: qty 1

## 2019-08-05 MED ORDER — FENTANYL CITRATE (PF) 100 MCG/2ML IJ SOLN
INTRAMUSCULAR | Status: AC | PRN
  Administered 2019-08-05: 50 ug via INTRAVENOUS

## 2019-08-05 MED ORDER — SODIUM CHLORIDE 0.9 % IV SOLN
INTRAVENOUS | Status: DC
  Administered 2019-08-05: 09:00:00 via INTRAVENOUS

## 2019-08-05 MED ORDER — FENTANYL CITRATE (PF) 100 MCG/2ML IJ SOLN
INTRAMUSCULAR | Status: AC
  Filled 2019-08-05: qty 2

## 2019-08-05 MED ORDER — MIDAZOLAM HCL 2 MG/2ML IJ SOLN
INTRAMUSCULAR | Status: AC
  Filled 2019-08-05: qty 2

## 2019-08-05 NOTE — H&P (Signed)
   Chief Complaint: Monoclonal paraproteinemia   Referring Physician(s): Kale,Gautam Kishore  Supervising Physician: McCullough, Heath  Patient Status: [facility] - Out-pt  History of Present Illness: Jasmine Fletcher is a 85 y.o. female who was seen by Dr. Kale for monocolonal paraproteinemia which is concerning for multiple myeloma.  Work up will include a PET and a bone marrow biopsy.  She is here today for the bone marrow biopsy.  She feels ok. Some decreased appetite. No nausea/vomiting. No Fever/chills. ROS negative.  She is NPO.   Past Medical History:  Diagnosis Date  . Hyperlipidemia   . Hypertension   . Osteoporosis     Past Surgical History:  Procedure Laterality Date  . ABDOMINAL HYSTERECTOMY  1998    Allergies: Patient has no known allergies.  Medications: Prior to Admission medications   Medication Sig Start Date End Date Taking? Authorizing Provider  aspirin 81 MG tablet Take 81 mg by mouth daily.    [provider]  calcium-vitamin D (OSCAL 500/200 D-3) 500-200 MG-UNIT per tablet Take 1 tablet by mouth 2 (two) times daily. 07/25/14   Pandey, Mahima, MD  denosumab (PROLIA) 60 MG/ML SOSY injection Inject 60 mg into the skin every 6 (six) months. 06/08/19   Eubanks, Jessica K, NP  donepezil (ARICEPT) 10 MG tablet Take 1 tablet (10 mg total) by mouth daily. 06/14/19   Eubanks, Jessica K, NP  feeding supplement (BOOST HIGH PROTEIN) LIQD Take 1 Container by mouth daily.    [provider]  hydrochlorothiazide (HYDRODIURIL) 25 MG tablet Take 1 tablet (25 mg total) by mouth daily. 06/14/19   Eubanks, Jessica K, NP  lisinopril (PRINIVIL,ZESTRIL) 5 MG tablet Take 1 tablet (5 mg total) by mouth daily. 03/23/19   Eubanks, Jessica K, NP  simvastatin (ZOCOR) 20 MG tablet TAKE 1 TABLET(20 MG) BY MOUTH DAILY 07/29/19   Eubanks, Jessica K, NP  traZODone (DESYREL) 50 MG tablet TAKE 1/2 TABLET(25 MG) BY MOUTH AT BEDTIME FOR SLEEP 07/12/19   Eubanks, Jessica K, NP      Family History  Problem Relation Age of Onset  . Cancer Father   . Dementia Sister   . Breast cancer Maternal Grandmother   . Breast cancer Paternal Grandmother     Social History   Socioeconomic History  . Marital status: Single    Spouse name: Not on file  . Number of children: Not on file  . Years of education: Not on file  . Highest education level: Not on file  Occupational History  . Not on file  Social Needs  . Financial resource strain: Not hard at all  . Food insecurity    Worry: Never true    Inability: Never true  . Transportation needs    Medical: No    Non-medical: No  Tobacco Use  . Smoking status: Former Smoker    Packs/day: 1.00    Quit date: 12/08/2006    Years since quitting: 12.6  . Smokeless tobacco: Never Used  . Tobacco comment: Quit t age 76   Substance and Sexual Activity  . Alcohol use: No    Alcohol/week: 0.0 standard drinks  . Drug use: No  . Sexual activity: Not on file  Lifestyle  . Physical activity    Days per week: 3 days    Minutes per session: 30 min  . Stress: Not at all  Relationships  . Social connections    Talks on phone: Three times a week    Gets together:   Three times a week    Attends religious service: Never    Active member of club or organization: No    Attends meetings of clubs or organizations: Never    Relationship status: Never married  Other Topics Concern  . Not on file  Social History Narrative  . Not on file     Review of Systems: A 12 point ROS discussed and pertinent positives are indicated in the HPI above.  All other systems are negative.  Review of Systems  Vital Signs: BP (!) 166/92 (BP Location: Right Arm)   Pulse 67   Temp 98.2 F (36.8 C) (Oral)   Resp 18   SpO2 100%   Physical Exam Vitals signs reviewed.  Constitutional:      Appearance: Normal appearance.  HENT:     Head: Normocephalic and atraumatic.  Eyes:     Extraocular Movements: Extraocular movements intact.  Neck:      Musculoskeletal: Normal range of motion.  Cardiovascular:     Rate and Rhythm: Normal rate and regular rhythm.  Pulmonary:     Effort: Pulmonary effort is normal.     Breath sounds: Normal breath sounds.  Abdominal:     General: There is no distension.     Palpations: Abdomen is soft.  Musculoskeletal: Normal range of motion.  Skin:    General: Skin is warm and dry.  Neurological:     General: No focal deficit present.     Mental Status: She is alert and oriented to person, place, and time.  Psychiatric:        Mood and Affect: Mood normal.        Behavior: Behavior normal.        Thought Content: Thought content normal.        Judgment: Judgment normal.     Imaging: No results found.  Labs:  CBC: Recent Labs    11/15/18 1007 06/14/19 0948 07/28/19 1124  WBC 4.3 4.6 5.2  HGB 10.8* 11.4* 10.8*  HCT 33.3* 35.5 34.5*  PLT 252 233 283    COAGS: No results for input(s): INR, APTT in the last 8760 hours.  BMP: Recent Labs    11/15/18 1007 06/14/19 0948 07/28/19 1124  NA 137 138 137  K 3.8 3.9 3.9  CL 102 103 103  CO2 30 28 25  GLUCOSE 79 84 83  BUN 19 34* 25*  CALCIUM 9.7 9.8 9.7  CREATININE 1.10* 1.08* 1.15*  GFRNONAA 46* 47* 43*  GFRAA 53* 54* 50*    LIVER FUNCTION TESTS: Recent Labs    06/14/19 0948 07/28/19 1124  BILITOT 0.2  0.2 0.2*  AST 16  16 17  ALT 8  8 9  ALKPHOS  --  64  PROT 10.1*  9.9*  9.9* 11.1*  ALBUMIN  --  3.5    TUMOR MARKERS: No results for input(s): AFPTM, CEA, CA199, CHROMGRNA in the last 8760 hours.  Assessment and Plan:  Monoclonal Proteinemia  Will proceed with bone marrow biopsy today to begin workup of Multiple Myeloma.  Risks and benefits of bone marrow biopsy was discussed with the patient and/or patient's family including, but not limited to bleeding, infection, damage to adjacent structures or low yield requiring additional tests.  All of the questions were answered and there is agreement to proceed.   Consent signed and in chart.  Thank you for this interesting consult.  I greatly enjoyed meeting Tenisha B Newey and look forward to participating in their care.    A copy of this report was sent to the requesting provider on this date.  Electronically Signed:  S , PA-C   08/05/2019, 8:20 AM      I spent a total of  30 Minutes in face to face in clinical consultation, greater than 50% of which was counseling/coordinating care for bone marrow biopsy.  

## 2019-08-05 NOTE — Procedures (Signed)
Interventional Radiology Procedure Note  Procedure: CT guided aspirate and core biopsy of right iliac bone Complications: None Recommendations: - Bedrest supine x 1 hrs - Hydrocodone PRN  Pain - Follow biopsy results  Signed,  Heath K. McCullough, MD   

## 2019-08-05 NOTE — Discharge Instructions (Addendum)
Bone Marrow Aspiration and Bone Marrow Biopsy, Adult, Care After °This sheet gives you information about how to care for yourself after your procedure. Your health care provider may also give you more specific instructions. If you have problems or questions, contact your health care provider. °What can I expect after the procedure? °After the procedure, it is common to have: °· Mild pain and tenderness. °· Swelling. °· Bruising. °Follow these instructions at home: °Puncture site care ° °  ° °· Follow instructions from your health care provider about how to take care of the puncture site. Make sure you: °? Wash your hands with soap and water before you change your bandage (dressing). If soap and water are not available, use hand sanitizer. °? Change your dressing as told by your health care provider. °· Check your puncture site every day for signs of infection. Check for: °? More redness, swelling, or pain. °? More fluid or blood. °? Warmth. °? Pus or a bad smell. °General instructions °· Take over-the-counter and prescription medicines only as told by your health care provider. °· Do not take baths, swim, or use a hot tub until your health care provider approves. Ask if you can take a shower or have a sponge bath. °· Return to your normal activities as told by your health care provider. Ask your health care provider what activities are safe for you. °· Do not drive for 24 hours if you were given a medicine to help you relax (sedative) during your procedure. °· Keep all follow-up visits as told by your health care provider. This is important. °Contact a health care provider if: °· Your pain is not controlled with medicine. °Get help right away if: °· You have a fever. °· You have more redness, swelling, or pain around the puncture site. °· You have more fluid or blood coming from the puncture site. °· Your puncture site feels warm to the touch. °· You have pus or a bad smell coming from the puncture site. °These  symptoms may represent a serious problem that is an emergency. Do not wait to see if the symptoms will go away. Get medical help right away. Call your local emergency services (911 in the [place] Do not drive yourself to the hospital. °Summary °· After the procedure, it is common to have mild pain, tenderness, swelling, and bruising. °· Follow instructions from your health care provider about how to take care of the puncture site. °· Get help right away if you have any symptoms of infection or if you have more blood or fluid coming from the puncture site. °This information is not intended to replace advice given to you by your health care provider. Make sure you discuss any questions you have with your health care provider. °Document Released: 06/13/2005 Document Revised: 03/09/2018 Document Reviewed: 05/07/2016 °Elsevier Patient Education © 2020 [employer] ° ° ° ° °Moderate Conscious Sedation, Adult, Care After °These instructions provide you with information about caring for yourself after your procedure. Your health care provider may also give you more specific instructions. Your treatment has been planned according to current medical practices, but problems sometimes occur. Call your health care provider if you have any problems or questions after your procedure. °What can I expect after the procedure? °After your procedure, it is common: °· To feel sleepy for several hours. °· To feel clumsy and have poor balance for several hours. °· To have poor judgment for several hours. °· To vomit if you eat too soon. °  Follow these instructions at home: °For at least 24 hours after the procedure: ° °· Do not: °? Participate in activities where you could fall or become injured. °? Drive. °? Use heavy machinery. °? Drink alcohol. °? Take sleeping pills or medicines that cause drowsiness. °? Make important decisions or sign legal documents. °? Take care of children on your own. °· Rest. °Eating and drinking °· Follow the  diet recommended by your health care provider. °· If you vomit: °? Drink water, juice, or soup when you can drink without vomiting. °? Make sure you have little or no nausea before eating solid foods. °General instructions °· Have a responsible adult stay with you until you are awake and alert. °· Take over-the-counter and prescription medicines only as told by your health care provider. °· If you smoke, do not smoke without supervision. °· Keep all follow-up visits as told by your health care provider. This is important. °Contact a health care provider if: °· You keep feeling nauseous or you keep vomiting. °· You feel light-headed. °· You develop a rash. °· You have a fever. °Get help right away if: °· You have trouble breathing. °This information is not intended to replace advice given to you by your health care provider. Make sure you discuss any questions you have with your health care provider. °Document Released: 09/14/2013 Document Revised: 11/06/2017 Document Reviewed: 03/15/2016 °Elsevier Patient Education © 2020 [employer] ° °

## 2019-08-05 NOTE — Progress Notes (Signed)
pts daughter aware to check with pts primary in regards to irregular heart rhythmn; pts daughter states pt has experience A Fib in past. Verbalized that she will followup with MD.

## 2019-08-08 ENCOUNTER — Other Ambulatory Visit: Payer: Self-pay

## 2019-08-08 ENCOUNTER — Ambulatory Visit (HOSPITAL_COMMUNITY)
Admission: RE | Admit: 2019-08-08 | Discharge: 2019-08-08 | Disposition: A | Discharge status: Home / Self Care | Payer: Medicare Other | Source: Ambulatory Visit | Attending: Hematology | Admitting: Hematology

## 2019-08-08 DIAGNOSIS — D259 Leiomyoma of uterus, unspecified: Secondary | ICD-10-CM | POA: Insufficient documentation

## 2019-08-08 DIAGNOSIS — N289 Disorder of kidney and ureter, unspecified: Secondary | ICD-10-CM | POA: Insufficient documentation

## 2019-08-08 DIAGNOSIS — E042 Nontoxic multinodular goiter: Secondary | ICD-10-CM | POA: Insufficient documentation

## 2019-08-08 DIAGNOSIS — Z79899 Other long term (current) drug therapy: Secondary | ICD-10-CM | POA: Diagnosis not present

## 2019-08-08 DIAGNOSIS — D472 Monoclonal gammopathy: Secondary | ICD-10-CM | POA: Insufficient documentation

## 2019-08-08 DIAGNOSIS — J984 Other disorders of lung: Secondary | ICD-10-CM | POA: Insufficient documentation

## 2019-08-08 DIAGNOSIS — C9000 Multiple myeloma not having achieved remission: Secondary | ICD-10-CM | POA: Insufficient documentation

## 2019-08-08 DIAGNOSIS — M419 Scoliosis, unspecified: Secondary | ICD-10-CM | POA: Insufficient documentation

## 2019-08-08 DIAGNOSIS — I517 Cardiomegaly: Secondary | ICD-10-CM | POA: Diagnosis not present

## 2019-08-08 LAB — GLUCOSE, CAPILLARY: Glucose-Capillary: 96 mg/dL (ref 70–99)

## 2019-08-08 MED ORDER — FLUDEOXYGLUCOSE F - 18 (FDG) INJECTION
7.5000 | Freq: Once | INTRAVENOUS | Status: AC | PRN
  Administered 2019-08-08: 10:00:00 7.5 via INTRAVENOUS

## 2019-08-11 NOTE — Progress Notes (Signed)
HEMATOLOGY/ONCOLOGY NOTE  Date of Service: 08/11/2019  Patient Care Team: Lauree Chandler, NP as PCP - General (Geriatric Medicine)   CHIEF COMPLAINTS:  Elevated serum globulin levels/Monoclonal paraproteinemia concerning for plasma cell dyscrasia   HISTORY OF PRESENTING ILLNESS:  Jasmine Fletcher is a wonderful 85 y.o. female who has been referred to [place] by Sherrie Mustache for evaluation and management of elevated serum globulin levels. The pt reports that she is doing well overall.  The pt reports that her appetite has decreased recently but that she eats a healthy diet. She notes mild fatigue. Denies new bone pain, back pain, hip pain, infection concerns, and any other new concerns.  She has been getting a Prolia shot every 6 months for the past 2 years. She has also been taking Aricept but notes that her memory is worsening. Her daughter is her health care POA, but the pt makes her own healthcare decisions at this time.  Most recent lab results (06/14/2019) of CBC is as follows: all values are WNL except for HGB at 11.4, MCH at 26.8. 06/21/2019 UPEP revealed protein/creat ratio at 209, total protein at 29, and two abnormal protein bands 06/14/2019 SPEP revealed total protein at 10.1, Alpha 2 at 1.0, gamma globulin at 4.1, abnormal protein band1 at 3.9  On review of systems, pt reports some fatigue, decreased appetite, and denies new bone pain, back pain, hip pain, infection concerns, and any other symptoms.   On PMHx the pt reports that she is taking BP medication  On Social Hx the pt reports that she quit smoking in 2004 and does not drink alcohol   INTERVAL HISTORY:  Jasmine Fletcher is a 85 y.o. female here for evaluation and management of plasma cell dyscrasia. The patient's last visit with [place] was on 07/28/2019. The pt reports that she is doing well overall.  The pt reports she is scheduled for routine mammography and a bone density screening on 09/06/2019.   Of note  since the patient's last visit, pt has had a PET scan completed on 07/29/2019 with results revealing "Two right thyroid nodules are both highly hypermetabolic. A significant minority of hypermetabolic thyroid lesions represent thyroid malignancy. Further workup with thyroid ultrasound is recommended. A small left upper para-aortic lymph node is mildly hypermetabolic with maximum SUV 4.7, significance uncertain. No significant abnormal accentuated osseous activity or compelling focal findings of active multiple myeloma in the skeleton. Hypodense lesions in the liver are photopenic and likely benign  Hyperdense small lesions of the left kidney upper pole are too small to characterize but statistically most likely to be complex cysts. If the patient has hematuria or if otherwise clinically warranted, renal protocol MRI with and without contrast could be utilized for further characterization. Mild mid to distal accentuated thoracic esophageal activity with maximum SUV of 4.8; mildly accentuated activity in the gastric cardia/gastroesophageal junction with maximum SUV 5.8. Both could be physiologic but rarely low-grade malignancy could have a similar appearance, upper endoscopy could be utilized for further workup if clinically warranted. Other imaging findings of potential clinical significance: Chronic ischemic microvascular white matter disease intracranially. Aortic Atherosclerosis (ICD10-I70.0). Mild cardiomegaly. Biapical pleuroparenchymal scarring and probable scarring in the superior segment left lower lobe. Calcified uterine fibroids. Thoracolumbar scoliosis.".  Pt had a bone marrow biopsy ([id number]) on 08/05/2019 with results revealing "no monoclonal b cell population or abnormal T cell phenotype identified. Did not show clear evidence of myeloma or overt clear lymphoma.  MEDICAL HISTORY:  Past Medical  History:  Diagnosis Date   Hyperlipidemia    Hypertension    Osteoporosis     SURGICAL  HISTORY: Past Surgical History:  Procedure Laterality Date   ABDOMINAL HYSTERECTOMY  1998    SOCIAL HISTORY: Social History   Socioeconomic History   Marital status: Single    Spouse name: Not on file   Number of children: Not on file   Years of education: Not on file   Highest education level: Not on file  Occupational History   Not on file  Social Needs   Financial resource strain: Not hard at all   Food insecurity    Worry: Never true    Inability: Never true   Transportation needs    Medical: No    Non-medical: No  Tobacco Use   Smoking status: Former Smoker    Packs/day: 1.00    Quit date: 12/08/2006    Years since quitting: 12.6   Smokeless tobacco: Never Used   Tobacco comment: Quit t age 38   Substance and Sexual Activity   Alcohol use: No    Alcohol/week: 0.0 standard drinks   Drug use: No   Sexual activity: Not on file  Lifestyle   Physical activity    Days per week: 3 days    Minutes per session: 30 min   Stress: Not at all  Relationships   Social connections    Talks on phone: Three times a week    Gets together: Three times a week    Attends religious service: Never    Active member of club or organization: No    Attends meetings of clubs or organizations: Never    Relationship status: Never married   Intimate partner violence    Fear of current or ex partner: No    Emotionally abused: No    Physically abused: No    Forced sexual activity: No  Other Topics Concern   Not on file  Social History Narrative   Not on file    FAMILY HISTORY: Family History  Problem Relation Age of Onset   Cancer Father    Dementia Sister    Breast cancer Maternal Grandmother    Breast cancer Paternal Grandmother     ALLERGIES:  has No Known Allergies.  MEDICATIONS:  Current Outpatient Medications  Medication Sig Dispense Refill   aspirin 81 MG tablet Take 81 mg by mouth daily.     calcium-vitamin D (OSCAL 500/200 D-3) 500-200  MG-UNIT per tablet Take 1 tablet by mouth 2 (two) times daily. 60 tablet 3   denosumab (PROLIA) 60 MG/ML SOSY injection Inject 60 mg into the skin every 6 (six) months. 1 mL 0   donepezil (ARICEPT) 10 MG tablet Take 1 tablet (10 mg total) by mouth daily. 90 tablet 1   feeding supplement (BOOST HIGH PROTEIN) LIQD Take 1 Container by mouth daily.     hydrochlorothiazide (HYDRODIURIL) 25 MG tablet Take 1 tablet (25 mg total) by mouth daily. 90 tablet 1   lisinopril (PRINIVIL,ZESTRIL) 5 MG tablet Take 1 tablet (5 mg total) by mouth daily. 90 tablet 1   simvastatin (ZOCOR) 20 MG tablet TAKE 1 TABLET(20 MG) BY MOUTH DAILY 90 tablet 1   traZODone (DESYREL) 50 MG tablet TAKE 1/2 TABLET(25 MG) BY MOUTH AT BEDTIME FOR SLEEP 45 tablet 1   No current facility-administered medications for this visit.     REVIEW OF SYSTEMS:   A 10+ POINT REVIEW OF SYSTEMS WAS OBTAINED including neurology, dermatology, psychiatry, cardiac,  respiratory, lymph, extremities, GI, GU, Musculoskeletal, constitutional, breasts, reproductive, HEENT.  All pertinent positives are noted in the HPI.  All others are negative.    PHYSICAL EXAMINATION: ECOG PERFORMANCE STATUS: 2 - Symptomatic, <50% confined to bed  . Vitals:   08/12/19 1025  BP: (!) 162/69  Pulse: 68  Resp: 18  Temp: 98.7 F (37.1 C)  SpO2: 98%   Filed Weights   08/12/19 1025  Weight: 150 lb 11.2 oz (68.4 kg)   Body mass index is 23.6 kg/m.  GENERAL:alert, in no acute distress and comfortable SKIN: no acute rashes, no significant lesions EYES: conjunctiva are pink and non-injected, sclera anicteric OROPHARYNX: MMM, no exudates, no oropharyngeal erythema or ulceration NECK: supple, no JVD LYMPH:  no palpable lymphadenopathy in the cervical, axillary or inguinal regions LUNGS: clear to auscultation b/l with normal respiratory effort HEART: regular rate & rhythm ABDOMEN:  normoactive bowel sounds , non tender, not distended. Extremity: no pedal  edema PSYCH: alert & oriented x 3 with fluent speech NEURO: no focal motor/sensory deficits    LABORATORY DATA:  I have reviewed the data as listed  . CBC Latest Ref Rng & Units 08/05/2019 07/28/2019 06/14/2019  WBC 4.0 - 10.5 K/uL 4.7 5.2 4.6  Hemoglobin 12.0 - 15.0 g/dL 11.6(L) 10.8(L) 11.4(L)  Hematocrit 36.0 - 46.0 % 38.0 34.5(L) 35.5  Platelets 150 - 400 K/uL 209 283 233    . CMP Latest Ref Rng & Units 07/28/2019 06/14/2019 06/14/2019  Glucose 70 - 99 mg/dL 83 - -  BUN 8 - 23 mg/dL 25(H) - -  Creatinine 0.44 - 1.00 mg/dL 1.15(H) - -  Sodium 135 - 145 mmol/L 137 - -  Potassium 3.5 - 5.1 mmol/L 3.9 - -  Chloride 98 - 111 mmol/L 103 - -  CO2 22 - 32 mmol/L 25 - -  Calcium 8.9 - 10.3 mg/dL 9.7 - -  Total Protein 6.5 - 8.1 g/dL 11.1(H) 10.1(H) 9.9(H)  Total Bilirubin 0.3 - 1.2 mg/dL 0.2(L) - 0.2  Alkaline Phos 38 - 126 U/L 64 - -  AST 15 - 41 U/L 17 - 16  ALT 0 - 44 U/L 9 - 8   Component     Latest Ref Rng & Units 06/21/2019 07/28/2019 08/01/2019  Total Protein, Urine-UPE24     Not Estab. mg/dL   8.2  Total Protein, Urine-Ur/day     30 - 150 mg/24 hr   180 (H)  ALBUMIN, U     %   19.7  ALPHA 1 URINE     %   1.5  Alpha 2, Urine     %   6.0  % BETA, Urine     %   21.4  GAMMA GLOBULIN URINE     %   51.4  Free Kappa Lt Chains,Ur     0.63 - 113.79 mg/L   383.74 (H)  Free Lambda Lt Chains,Ur     0.47 - 11.77 mg/L   0.68  Free Kappa/Lambda Ratio     1.03 - 31.76   564.32 (H)  Immunofixation Result, Urine        Comment  Total Volume        2,200  M-SPIKE %, Urine     Not Observed %   28.3 (H)  M-Spike, mg/24 hr     Not Observed mg/24 hr   51 (H)  NOTE:        Comment  IgG (Immunoglobin G), Serum     586 -  1,602 mg/dL  5,351 (H)   IgA     64 - 422 mg/dL  99   IgM (Immunoglobulin M), Srm     26 - 217 mg/dL  15 (L)   Total Protein ELP     6.0 - 8.5 g/dL  10.2 (H)   Albumin SerPl Elph-Mcnc     2.9 - 4.4 g/dL  4.1   Alpha 1     0.0 - 0.4 g/dL  0.3   Alpha2 Glob  SerPl Elph-Mcnc     0.4 - 1.0 g/dL  0.8   B-Globulin SerPl Elph-Mcnc     0.7 - 1.3 g/dL  1.1   Gamma Glob SerPl Elph-Mcnc     0.4 - 1.8 g/dL  3.9 (H)   M Protein SerPl Elph-Mcnc     Not Observed g/dL  3.4 (H)   Globulin, Total     2.2 - 3.9 g/dL  6.1 (H)   Albumin/Glob SerPl     0.7 - 1.7  0.7   IFE 1       Comment   Please Note (HCV):       Comment   Creatinine, Urine     20 - 275 mg/dL 139    Protein/Creat Ratio     21 - 161 mg/g creat 209 (H)    Protein/Creatinine Ratio     0.021 - 0.16 mg/mg creat 0.209 (H)    Total Protein, Urine     5 - 24 mg/dL 29 (H)    Albumin     % 44    Alpha-1-Globulin, U     % 3    ALPHA-2-GLOBULIN, U     % 12    Beta Globulin, U     % 9    Gamma Globulin, U     % 33    Abnormal Protein Band     NONE DETEC mg/dL 7 (H)    Abnormal Protein Band     NONE DETEC mg/dL 1 (H)    Interpretation          Iron     41 - 142 ug/dL  39 (L)   TIBC     236 - 444 ug/dL  248   Saturation Ratios     21 - 57 %  16 (L)   UIBC     120 - 384 ug/dL  208   Kappa free light chain     3.3 - 19.4 mg/L  412.0 (H)   Lamda free light chains     5.7 - 26.3 mg/L  8.0   Kappa, lamda light chain ratio     0.26 - 1.65  51.50 (H)   Sed Rate     0 - 22 mm/hr  110 (H)   LDH     98 - 192 U/L  135   Ferritin     11 - 307 ng/mL  154   Vitamin B12     180 - 914 pg/mL  709     08/05/2019 Bone Marrow Biopsy ([id number])    06/14/2019 SPEP:    RADIOGRAPHIC STUDIES: I have personally reviewed the radiological images as listed and agreed with the findings in the report. Nm Pet Image Initial (pi) Whole Body  Result Date: 08/08/2019 CLINICAL DATA:  Initial treatment strategy for multiple myeloma. EXAM: NUCLEAR MEDICINE PET WHOLE BODY TECHNIQUE: 7.5 mCi F-18 FDG was injected intravenously. Full-ring PET imaging was performed from the skull base to thigh after the radiotracer. CT data  was obtained and used for attenuation correction and anatomic localization.  Fasting blood glucose: 96 mg/dl COMPARISON:  Chest radiograph from 03/05/2017 FINDINGS: Mediastinal blood pool activity: SUV max 3.3 Background liver activity: 4.3 HEAD/NECK: Solid-appearing right lateral thyroid 1.3 cm nodule on image 69/4, maximum SUV 18.9. Hypodense right inferior thyroid nodule 2.5 by 2.0 cm on image 73/4, maximum SUV 19.2 Incidental CT findings: Periventricular white matter hypodensities compatible with chronic ischemic microvascular white matter disease. CHEST: Mild mid to distal thoracic esophageal activity, maximum SUV 4.8. Incidental CT findings: Atherosclerotic calcification of the aortic arch. Mild cardiomegaly. Biapical pleuroparenchymal scarring. Bandlike opacity along the upper margin of the left major fissure is mostly in the superior segment left lower lobe has low-grade activity with maximum SUV at 2.4, likely from scarring. ABDOMEN/PELVIS: Upper left para-aortic lymph node measuring 0.7 cm in short axis on image 128/4 has maximum SUV of 4.7. Accentuated activity the stomach cardia/gastroesophageal junction, maximum SUV 5.8. Photopenic cyst of the left kidney lower pole. Incidental CT findings: Hypodense lesions in the liver appear photopenic and are likely cysts or similar benign lesions. Hyperdense lesions of the left kidney upper pole are too small to characterize but most likely to be complex cysts. Aortoiliac atherosclerotic vascular disease. Calcified uterine fibroids. SKELETON: Faintly accentuated activity in the right iliac bone adjacent to the SI joint, maximum SUV 3.7, but without associated CT abnormality, probably incidental. Degenerative activity in the spine. Incidental CT findings: Dextroconvex upper thoracic scoliosis with significant rotary component. Levoconvex lumbar scoliosis with rotary component. Degenerative facet arthropathy on the left. No well-defined lytic bony lesions. EXTREMITIES: No significant abnormal hypermetabolic activity in this region. Incidental  CT findings: none IMPRESSION: 1. Two right thyroid nodules are both highly hypermetabolic. A significant minority of hypermetabolic thyroid lesions represent thyroid malignancy. Further workup with thyroid ultrasound is recommended. 2. A small left upper para-aortic lymph node is mildly hypermetabolic with maximum SUV 4.7, significance uncertain. 3. No significant abnormal accentuated osseous activity or compelling focal findings of active multiple myeloma in the skeleton. 4. Hypodense lesions in the liver are photopenic and likely benign. 5. Hyperdense small lesions of the left kidney upper pole are too small to characterize but statistically most likely to be complex cysts. If the patient has hematuria or if otherwise clinically warranted, renal protocol MRI with and without contrast could be utilized for further characterization. 6. Mild mid to distal accentuated thoracic esophageal activity with maximum SUV of 4.8; mildly accentuated activity in the gastric cardia/gastroesophageal junction with maximum SUV 5.8. Both could be physiologic but rarely low-grade malignancy could have a similar appearance, upper endoscopy could be utilized for further workup if clinically warranted. 7. Other imaging findings of potential clinical significance: Chronic ischemic microvascular white matter disease intracranially. Aortic Atherosclerosis (ICD10-I70.0). Mild cardiomegaly. Biapical pleuroparenchymal scarring and probable scarring in the superior segment left lower lobe. Calcified uterine fibroids. Thoracolumbar scoliosis. Electronically Signed   By: Van Clines M.D.   On: 08/08/2019 13:14   Ct Biopsy  Result Date: 08/05/2019 INDICATION: 85 year old female with monoclonal paraproteinemia concerning for multiple myeloma. She presents for bone marrow biopsy. EXAM: CT GUIDED BONE MARROW ASPIRATION AND CORE BIOPSY Interventional Radiologist:  Criselda Peaches, MD MEDICATIONS: None. ANESTHESIA/SEDATION: Moderate  (conscious) sedation was employed during this procedure. A total of 1.5 milligrams versed and 50 micrograms fentanyl were administered intravenously. The patient's level of consciousness and vital signs were monitored continuously by radiology nursing throughout the procedure under my direct supervision. Total monitored sedation time: 10 minutes  FLUOROSCOPY TIME:  None. COMPLICATIONS: None immediate. Estimated blood loss: <25 mL PROCEDURE: Informed written consent was obtained from the patient after a thorough discussion of the procedural risks, benefits and alternatives. All questions were addressed. Maximal Sterile Barrier Technique was utilized including caps, mask, sterile gowns, sterile gloves, sterile drape, hand hygiene and skin antiseptic. A timeout was performed prior to the initiation of the procedure. The patient was positioned prone and non-contrast localization CT was performed of the pelvis to demonstrate the iliac marrow spaces. Maximal barrier sterile technique utilized including caps, mask, sterile gowns, sterile gloves, large sterile drape, hand hygiene, and betadine prep. Under sterile conditions and local anesthesia, an 11 gauge coaxial bone biopsy needle was advanced into the right iliac marrow space. Needle position was confirmed with CT imaging. Initially, bone marrow aspiration was performed. Next, the 11 gauge outer cannula was utilized to obtain a right iliac bone marrow core biopsy. Needle was removed. Hemostasis was obtained with compression. The patient tolerated the procedure well. Samples were prepared with the cytotechnologist. IMPRESSION: Technically successful CT-guided bone marrow aspiration and biopsy of right iliac bone. Signed, Criselda Peaches, MD, [facility] Radiology Electronically Signed   By: Jacqulynn Cadet M.D.   On: 08/05/2019 10:07   Ct Bone Marrow Biopsy & Aspiration  Result Date: 08/05/2019 INDICATION:  85 year old female with monoclonal paraproteinemia concerning for multiple myeloma. She presents for bone marrow biopsy. EXAM: CT GUIDED BONE MARROW ASPIRATION AND CORE BIOPSY Interventional Radiologist:  Criselda Peaches, MD MEDICATIONS: None. ANESTHESIA/SEDATION: Moderate (conscious) sedation was employed during this procedure. A total of 1.5 milligrams versed and 50 micrograms fentanyl were administered intravenously. The patient's level of consciousness and vital signs were monitored continuously by radiology nursing throughout the procedure under my direct supervision. Total monitored sedation time: 10 minutes FLUOROSCOPY TIME:  None. COMPLICATIONS: None immediate. Estimated blood loss: <25 mL PROCEDURE: Informed written consent was obtained from the patient after a thorough discussion of the procedural risks, benefits and alternatives. All questions were addressed. Maximal Sterile Barrier Technique was utilized including caps, mask, sterile gowns, sterile gloves, sterile drape, hand hygiene and skin antiseptic. A timeout was performed prior to the initiation of the procedure. The patient was positioned prone and non-contrast localization CT was performed of the pelvis to demonstrate the iliac marrow spaces. Maximal barrier sterile technique utilized including caps, mask, sterile gowns, sterile gloves, large sterile drape, hand hygiene, and betadine prep. Under sterile conditions and local anesthesia, an 11 gauge coaxial bone biopsy needle was advanced into the right iliac marrow space. Needle position was confirmed with CT imaging. Initially, bone marrow aspiration was performed. Next, the 11 gauge outer cannula was utilized to obtain a right iliac bone marrow core biopsy. Needle was removed. Hemostasis was obtained with compression. The patient tolerated the procedure well. Samples were prepared with the cytotechnologist. IMPRESSION: Technically successful CT-guided bone marrow aspiration and biopsy of  right iliac bone. Signed, Criselda Peaches, MD, [place] [facility] Radiology Electronically Signed   By: Jacqulynn Cadet M.D.   On: 08/05/2019 10:07    ASSESSMENT & PLAN:   #1 IgG Kappa Monoclonal paraproteinemia with M protein of 3.9 -highly concerning for MM -06/14/2019 SPEP revealed total protein at 10.1, alpha 2 at 1.0, gamma globulin at 4.1, Abnormal protein band1 at 3.9 UPEP +ve for Bence Jones protein 2.  Patient Active Problem List   Diagnosis Date Noted   Dementia without behavioral disturbance ([place]) 07/01/2017  Mixed hyperlipidemia 11/29/2013   Osteoporosis 03/15/2013   BRADYCARDIA 02/17/2009   HYPERTENSION, BENIGN ESSENTIAL 02/16/2009   ANEMIA, HYPOCHROMIC 07/26/2007   DISORDER, TOBACCO USE 07/26/2007   DEPRESSION 07/26/2007   CARDIAC CATHETERIZATION, LEFT, HX OF 07/26/2007     PLAN: -Discussed pt labwork today, 07/27/19; SPEP, UPEP, elevated sed rate concerning for myeloma. -Discussed 07/29/2019 PET scan with results revealing "Two right thyroid nodules are both highly hypermetabolic. A significant minority of hypermetabolic thyroid lesions represent thyroid malignancy. Further workup with thyroid ultrasound is recommended. A small left upper para-aortic lymph node is mildly hypermetabolic with maximum SUV 4.7, significance uncertain. No significant abnormal accentuated osseous activity or compelling focal findings of active multiple myeloma in the skeleton. Hypodense lesions in the liver are photopenic and likely benign  Hyperdense small lesions of the left kidney upper pole are too small to characterize but statistically most likely to be complex cysts. If the patient has hematuria or if otherwise clinically warranted, renal protocol MRI with and without contrast could be utilized for further characterization. Mild mid to distal accentuated thoracic esophageal activity with maximum SUV of 4.8; mildly accentuated  activity in the gastric cardia/gastroesophageal junction with maximum SUV 5.8. Both could be physiologic but rarely low-grade malignancy could have a similar appearance, upper endoscopy could be utilized for further workup if clinically warranted. Other imaging findings of potential clinical significance: Chronic ischemic microvascular white matter disease intracranially. Aortic Atherosclerosis (ICD10-I70.0). Mild cardiomegaly. Biapical pleuroparenchymal scarring and probable scarring in the superior segment left lower lobe. Calcified uterine fibroids. Thoracolumbar scoliosis. No clear bone lesions. -Discussed 08/05/2019 bone marrow biopsy ([id number]) with results revealing "no monoclonal b cell population or abnormal T cell phenotype identified. Atypical lymphoplasmacytoid aggregates - not clear for myeloma -discussed BM Bx -poor aspirate. Sample indeterminate and recommended rpt bone marrow biopsy --- patient is agreeable to this. Ordered.  3.FDG avid thyroid nodules Plan -will need thyroid US and biopsy at a later time point.   FOLLOW UP: Rpt CT bone marrow aspiration and biopsy in 3 weeks Phone visit with Dr Irene Limbo in 4 weeks  The total time spent in the appt was 25 minutes and more than 50% was on counseling and direct patient cares.  All of the patient's questions were answered with apparent satisfaction. The patient knows to call the clinic with any problems, questions or concerns.     Sullivan Lone MD MS AAHIVMS [facility] Hematology/Oncology Physician [facility]  (Office):       [phone] (Work cell):  [phone] (Fax):           [phone]  08/11/2019 11:08 PM  I, Jacqualyn Posey, am acting as a Education administrator for Dr. Sullivan Lone.   .I have reviewed the above documentation for accuracy and completeness, and I agree with the above. Brunetta Genera MD

## 2019-08-12 ENCOUNTER — Inpatient Hospital Stay: Payer: Medicare Other | Attending: Hematology | Admitting: Hematology

## 2019-08-12 ENCOUNTER — Other Ambulatory Visit: Payer: Self-pay

## 2019-08-12 ENCOUNTER — Telehealth: Payer: Self-pay | Admitting: Hematology

## 2019-08-12 VITALS — BP 162/69 | HR 68 | Temp 98.7°F | Resp 18 | Ht 67.0 in | Wt 150.7 lb

## 2019-08-12 DIAGNOSIS — D472 Monoclonal gammopathy: Secondary | ICD-10-CM | POA: Diagnosis present

## 2019-08-12 DIAGNOSIS — E079 Disorder of thyroid, unspecified: Secondary | ICD-10-CM | POA: Diagnosis not present

## 2019-08-12 DIAGNOSIS — R771 Abnormality of globulin: Secondary | ICD-10-CM | POA: Diagnosis present

## 2019-08-12 DIAGNOSIS — E8809 Other disorders of plasma-protein metabolism, not elsewhere classified: Secondary | ICD-10-CM | POA: Diagnosis not present

## 2019-08-12 NOTE — Telephone Encounter (Signed)
Scheduled appt per 9/4 los.  Left a voice message of appt date and time.

## 2019-08-16 ENCOUNTER — Encounter (HOSPITAL_COMMUNITY): Payer: Self-pay | Admitting: Hematology

## 2019-08-17 ENCOUNTER — Encounter (HOSPITAL_COMMUNITY): Payer: Self-pay | Admitting: Hematology

## 2019-08-24 ENCOUNTER — Other Ambulatory Visit: Payer: Medicare Other

## 2019-08-24 ENCOUNTER — Ambulatory Visit: Payer: Medicare Other

## 2019-09-01 ENCOUNTER — Other Ambulatory Visit: Payer: Self-pay | Admitting: Radiology

## 2019-09-01 ENCOUNTER — Other Ambulatory Visit: Payer: Self-pay | Admitting: Student

## 2019-09-02 ENCOUNTER — Ambulatory Visit (HOSPITAL_COMMUNITY)
Admission: RE | Admit: 2019-09-02 | Discharge: 2019-09-02 | Disposition: A | Discharge status: Home / Self Care | Payer: Medicare Other | Source: Ambulatory Visit | Attending: Hematology | Admitting: Hematology

## 2019-09-02 ENCOUNTER — Encounter (HOSPITAL_COMMUNITY): Payer: Self-pay

## 2019-09-02 ENCOUNTER — Telehealth: Payer: Self-pay | Admitting: *Deleted

## 2019-09-02 ENCOUNTER — Other Ambulatory Visit: Payer: Self-pay

## 2019-09-02 DIAGNOSIS — D472 Monoclonal gammopathy: Secondary | ICD-10-CM | POA: Insufficient documentation

## 2019-09-02 DIAGNOSIS — I491 Atrial premature depolarization: Secondary | ICD-10-CM | POA: Diagnosis not present

## 2019-09-02 DIAGNOSIS — I4891 Unspecified atrial fibrillation: Secondary | ICD-10-CM | POA: Diagnosis not present

## 2019-09-02 DIAGNOSIS — Z79899 Other long term (current) drug therapy: Secondary | ICD-10-CM | POA: Insufficient documentation

## 2019-09-02 DIAGNOSIS — E785 Hyperlipidemia, unspecified: Secondary | ICD-10-CM | POA: Insufficient documentation

## 2019-09-02 DIAGNOSIS — E8809 Other disorders of plasma-protein metabolism, not elsewhere classified: Secondary | ICD-10-CM | POA: Diagnosis present

## 2019-09-02 DIAGNOSIS — I10 Essential (primary) hypertension: Secondary | ICD-10-CM | POA: Diagnosis not present

## 2019-09-02 DIAGNOSIS — M810 Age-related osteoporosis without current pathological fracture: Secondary | ICD-10-CM | POA: Insufficient documentation

## 2019-09-02 DIAGNOSIS — Z7982 Long term (current) use of aspirin: Secondary | ICD-10-CM | POA: Diagnosis not present

## 2019-09-02 DIAGNOSIS — R9431 Abnormal electrocardiogram [ECG] [EKG]: Secondary | ICD-10-CM | POA: Insufficient documentation

## 2019-09-02 DIAGNOSIS — Z90710 Acquired absence of both cervix and uterus: Secondary | ICD-10-CM | POA: Diagnosis not present

## 2019-09-02 LAB — CBC WITH DIFFERENTIAL/PLATELET
Abs Immature Granulocytes: 0.02 10*3/uL (ref 0.00–0.07)
Basophils Absolute: 0 10*3/uL (ref 0.0–0.1)
Basophils Relative: 1 %
Eosinophils Absolute: 0 10*3/uL (ref 0.0–0.5)
Eosinophils Relative: 1 %
HCT: 37.4 % (ref 36.0–46.0)
Hemoglobin: 11.4 g/dL — ABNORMAL LOW (ref 12.0–15.0)
Immature Granulocytes: 0 %
Lymphocytes Relative: 27 %
Lymphs Abs: 1.2 10*3/uL (ref 0.7–4.0)
MCH: 26.7 pg (ref 26.0–34.0)
MCHC: 30.5 g/dL (ref 30.0–36.0)
MCV: 87.6 fL (ref 80.0–100.0)
Monocytes Absolute: 0.6 10*3/uL (ref 0.1–1.0)
Monocytes Relative: 14 %
Neutro Abs: 2.6 10*3/uL (ref 1.7–7.7)
Neutrophils Relative %: 57 %
Platelets: 217 10*3/uL (ref 150–400)
RBC: 4.27 MIL/uL (ref 3.87–5.11)
RDW: 14.1 % (ref 11.5–15.5)
WBC: 4.5 10*3/uL (ref 4.0–10.5)
nRBC: 0 % (ref 0.0–0.2)

## 2019-09-02 LAB — PROTIME-INR
INR: 1.1 (ref 0.8–1.2)
Prothrombin Time: 13.7 seconds (ref 11.4–15.2)

## 2019-09-02 MED ORDER — MIDAZOLAM HCL 2 MG/2ML IJ SOLN
INTRAMUSCULAR | Status: AC | PRN
  Administered 2019-09-02: 0.5 mg via INTRAVENOUS
  Administered 2019-09-02: 1 mg via INTRAVENOUS

## 2019-09-02 MED ORDER — FLUMAZENIL 0.5 MG/5ML IV SOLN
INTRAVENOUS | Status: AC
  Filled 2019-09-02: qty 5

## 2019-09-02 MED ORDER — MIDAZOLAM HCL 2 MG/2ML IJ SOLN
INTRAMUSCULAR | Status: AC
  Filled 2019-09-02: qty 2

## 2019-09-02 MED ORDER — NALOXONE HCL 0.4 MG/ML IJ SOLN
INTRAMUSCULAR | Status: AC
  Filled 2019-09-02: qty 1

## 2019-09-02 MED ORDER — SODIUM CHLORIDE 0.9 % IV SOLN
INTRAVENOUS | Status: DC
  Administered 2019-09-02: 08:00:00 via INTRAVENOUS

## 2019-09-02 MED ORDER — FENTANYL CITRATE (PF) 100 MCG/2ML IJ SOLN
INTRAMUSCULAR | Status: AC | PRN
  Administered 2019-09-02: 50 ug via INTRAVENOUS

## 2019-09-02 MED ORDER — LIDOCAINE HCL (PF) 1 % IJ SOLN
INTRAMUSCULAR | Status: AC | PRN
  Administered 2019-09-02: 10 mL

## 2019-09-02 MED ORDER — FENTANYL CITRATE (PF) 100 MCG/2ML IJ SOLN
INTRAMUSCULAR | Status: AC
  Filled 2019-09-02: qty 2

## 2019-09-02 NOTE — H&P (Addendum)
Referring Physician(s): Jasmine Fletcher  Supervising Physician: Jasmine Fletcher  Patient Status:  [facility]  Chief Complaint: "I'm having another bone marrow biopsy"   Subjective: Patient familiar to IR service from bone marrow biopsy on 08/05/2019.  Biopsy sample was suboptimal but did yield some atypical lymphoplasmacytic aggregates.  She has a history of monoclonal paraproteinemia and presents again today for repeat bone marrow biopsy for further evaluation.  She currently denies fever, headache, chest pain, dyspnea, cough, abdominal/back pain, nausea, vomiting or bleeding.  Past Medical History:  Diagnosis Date  . Hyperlipidemia   . Hypertension   . Osteoporosis    Past Surgical History:  Procedure Laterality Date  . ABDOMINAL HYSTERECTOMY  1998      Allergies: Patient has no known allergies.  Medications: Prior to Admission medications   Medication Sig Start Date End Date Taking? Authorizing Provider  aspirin 81 MG tablet Take 81 mg by mouth daily.   Yes [provider]  calcium-vitamin D (OSCAL 500/200 D-3) 500-200 MG-UNIT per tablet Take 1 tablet by mouth 2 (two) times daily. 07/25/14  Yes Blanchie Serve, MD  donepezil (ARICEPT) 10 MG tablet Take 1 tablet (10 mg total) by mouth daily. 06/14/19  Yes Lauree Chandler, NP  feeding supplement (BOOST HIGH PROTEIN) LIQD Take 1 Container by mouth daily.   Yes [provider]  hydrochlorothiazide (HYDRODIURIL) 25 MG tablet Take 1 tablet (25 mg total) by mouth daily. 06/14/19  Yes Lauree Chandler, NP  lisinopril (PRINIVIL,ZESTRIL) 5 MG tablet Take 1 tablet (5 mg total) by mouth daily. 03/23/19  Yes Lauree Chandler, NP  simvastatin (ZOCOR) 20 MG tablet TAKE 1 TABLET(20 MG) BY MOUTH DAILY 07/29/19  Yes Lauree Chandler, NP  traZODone (DESYREL) 50 MG tablet TAKE 1/2 TABLET(25 MG) BY MOUTH AT BEDTIME FOR SLEEP 07/12/19  Yes Lauree Chandler, NP  denosumab (PROLIA) 60 MG/ML SOSY injection Inject 60 mg into  the skin every 6 (six) months. 06/08/19   Lauree Chandler, NP     Vital Signs: BP (!) 157/95 (BP Location: Right Arm)   Pulse (!) 49   Temp 98.7 F (37.1 C) (Oral)   Resp 16   SpO2 98%   Physical Exam awake, alert.  Chest clear to auscultation bilaterally.  Heart with irregular irregular rhythm.  Abdomen soft, positive bowel sounds, nontender.  No lower extremity edema.  Imaging: No results found.  Labs:  CBC: Recent Labs    06/14/19 0948 07/28/19 1124 08/05/19 0835 09/02/19 0802  WBC 4.6 5.2 4.7 4.5  HGB 11.4* 10.8* 11.6* 11.4*  HCT 35.5 34.5* 38.0 37.4  PLT 233 283 209 217    COAGS: Recent Labs    08/05/19 0835 09/02/19 0802  INR 1.1 1.1    BMP: Recent Labs    11/15/18 1007 06/14/19 0948 07/28/19 1124  NA 137 138 137  K 3.8 3.9 3.9  CL 102 103 103  CO2 _0 GLUCOSE 79 84 83  BUN 19 34* 25*  CALCIUM 9.7 9.8 9.7  CREATININE 1.10* 1.08* 1.15*  GFRNONAA 46* 47* 43*  GFRAA 53* 54* 50*    LIVER FUNCTION TESTS: Recent Labs    06/14/19 0948 07/28/19 1124  BILITOT 0.2  0.2 0.2*  AST _1 ALT _2 ALKPHOS  --  64  PROT 10.1*  9.9*  9.9* 11.1*  ALBUMIN  --  3.5    Assessment and Plan: Patient with history of  monoclonal paraproteinemia, recent bone marrow biopsy on 08/05/2019 yielding atypical lymphoplasmacytic aggregates but sample considered suboptimal; presents again today for repeat CT-guided bone marrow biopsy for further evaluation.Risks and benefits of procedure was discussed with the patient  including, but not limited to bleeding, infection, damage to adjacent structures or low yield requiring additional tests.  Patient noted to be in atrial fibrillation on EKG today. No prior EKG's reveal afib, although the patient was reportedly in atrial fibrillation during last bone marrow biopsy.  Rate okay.  Patient asymptomatic. Dr. Grier Mitts office notified and they will relay message to him.  All of the questions were answered and there  is agreement to proceed.  Consent signed and in chart.     Electronically Signed: D. Rowe Robert, PA-C 09/02/2019, 8:36 AM   I spent a total of 20 minutes at the the patient's bedside AND on the patient's hospital floor or unit, greater than 50% of which was counseling/coordinating care for CT-guided bone marrow biopsy

## 2019-09-02 NOTE — Discharge Instructions (Signed)
Bone Marrow Aspiration and Bone Marrow Biopsy, Adult, Care After °This sheet gives you information about how to care for yourself after your procedure. Your health care provider may also give you more specific instructions. If you have problems or questions, contact your health care provider. °What can I expect after the procedure? °After the procedure, it is common to have: °· Mild pain and tenderness. °· Swelling. °· Bruising. °Follow these instructions at home: °Puncture site care ° °  ° °· Follow instructions from your health care provider about how to take care of the puncture site. Make sure you: °? Wash your hands with soap and water before you change your bandage (dressing). If soap and water are not available, use hand sanitizer. °? Change your dressing as told by your health care provider. °· Check your puncture site every day for signs of infection. Check for: °? More redness, swelling, or pain. °? More fluid or blood. °? Warmth. °? Pus or a bad smell. °General instructions °· Take over-the-counter and prescription medicines only as told by your health care provider. °· Do not take baths, swim, or use a hot tub until your health care provider approves. Ask if you can take a shower or have a sponge bath. °· Return to your normal activities as told by your health care provider. Ask your health care provider what activities are safe for you. °· Do not drive for 24 hours if you were given a medicine to help you relax (sedative) during your procedure. °· Keep all follow-up visits as told by your health care provider. This is important. °Contact a health care provider if: °· Your pain is not controlled with medicine. °Get help right away if: °· You have a fever. °· You have more redness, swelling, or pain around the puncture site. °· You have more fluid or blood coming from the puncture site. °· Your puncture site feels warm to the touch. °· You have pus or a bad smell coming from the puncture site. °These  symptoms may represent a serious problem that is an emergency. Do not wait to see if the symptoms will go away. Get medical help right away. Call your local emergency services (911 in the [place] Do not drive yourself to the hospital. °Summary °· After the procedure, it is common to have mild pain, tenderness, swelling, and bruising. °· Follow instructions from your health care provider about how to take care of the puncture site. °· Get help right away if you have any symptoms of infection or if you have more blood or fluid coming from the puncture site. °This information is not intended to replace advice given to you by your health care provider. Make sure you discuss any questions you have with your health care provider. °Document Released: 06/13/2005 Document Revised: 03/09/2018 Document Reviewed: 05/07/2016 °Elsevier Patient Education © 2020 [employer] ° ° ° ° °Moderate Conscious Sedation, Adult, Care After °These instructions provide you with information about caring for yourself after your procedure. Your health care provider may also give you more specific instructions. Your treatment has been planned according to current medical practices, but problems sometimes occur. Call your health care provider if you have any problems or questions after your procedure. °What can I expect after the procedure? °After your procedure, it is common: °· To feel sleepy for several hours. °· To feel clumsy and have poor balance for several hours. °· To have poor judgment for several hours. °· To vomit if you eat too soon. °  Follow these instructions at home: °For at least 24 hours after the procedure: ° °· Do not: °? Participate in activities where you could fall or become injured. °? Drive. °? Use heavy machinery. °? Drink alcohol. °? Take sleeping pills or medicines that cause drowsiness. °? Make important decisions or sign legal documents. °? Take care of children on your own. °· Rest. °Eating and drinking °· Follow the  diet recommended by your health care provider. °· If you vomit: °? Drink water, juice, or soup when you can drink without vomiting. °? Make sure you have little or no nausea before eating solid foods. °General instructions °· Have a responsible adult stay with you until you are awake and alert. °· Take over-the-counter and prescription medicines only as told by your health care provider. °· If you smoke, do not smoke without supervision. °· Keep all follow-up visits as told by your health care provider. This is important. °Contact a health care provider if: °· You keep feeling nauseous or you keep vomiting. °· You feel light-headed. °· You develop a rash. °· You have a fever. °Get help right away if: °· You have trouble breathing. °This information is not intended to replace advice given to you by your health care provider. Make sure you discuss any questions you have with your health care provider. °Document Released: 09/14/2013 Document Revised: 11/06/2017 Document Reviewed: 03/15/2016 °Elsevier Patient Education © 2020 [employer] ° °

## 2019-09-02 NOTE — Procedures (Signed)
  Procedure: CT L iliac bone marrow biopsy   EBL:   minimal Complications:  none immediate  See full dictation in [facility].  Dillard Cannon MD Main # [phone] Pager  [phone]

## 2019-09-02 NOTE — Telephone Encounter (Signed)
Contacted by Rowe Robert, PA w/ Radiology to report that patient there for repeat bone marrow biopsy and was noted to be in Afib on EKG, with rate in 80s. Dr.Kale informed.  See note below from Pt H&P: "Patient noted to be in atrial fibrillation on EKG today. No prior EKG's reveal afib, although the patient was reportedly in atrial fibrillation during last bone marrow biopsy.  Rate okay.  Patient asymptomatic.Dr. Grier Mitts office notified  Ands they will relay message"

## 2019-09-05 ENCOUNTER — Other Ambulatory Visit: Payer: Self-pay | Admitting: Hematology

## 2019-09-06 ENCOUNTER — Ambulatory Visit
Admission: RE | Admit: 2019-09-06 | Discharge: 2019-09-06 | Disposition: A | Discharge status: Home / Self Care | Payer: Medicare Other | Source: Ambulatory Visit | Attending: Nurse Practitioner | Admitting: Nurse Practitioner

## 2019-09-06 ENCOUNTER — Other Ambulatory Visit: Payer: Self-pay

## 2019-09-06 ENCOUNTER — Ambulatory Visit: Payer: Medicare Other

## 2019-09-06 ENCOUNTER — Other Ambulatory Visit: Payer: Medicare Other

## 2019-09-06 DIAGNOSIS — M810 Age-related osteoporosis without current pathological fracture: Secondary | ICD-10-CM

## 2019-09-06 DIAGNOSIS — Z1231 Encounter for screening mammogram for malignant neoplasm of breast: Secondary | ICD-10-CM

## 2019-09-06 LAB — SURGICAL PATHOLOGY

## 2019-09-07 ENCOUNTER — Encounter (HOSPITAL_COMMUNITY): Payer: Self-pay | Admitting: Hematology

## 2019-09-08 ENCOUNTER — Telehealth: Payer: Self-pay | Admitting: Hematology

## 2019-09-08 NOTE — Progress Notes (Signed)
HEMATOLOGY/ONCOLOGY NOTE  Date of Service: 09/08/2019  Patient Care Team: Lauree Chandler, NP as PCP - General (Geriatric Medicine)   CHIEF COMPLAINTS:  Elevated serum globulin levels/Monoclonal paraproteinemia concerning for plasma cell dyscrasia   HISTORY OF PRESENTING ILLNESS:  Jasmine Fletcher is a wonderful 85 y.o. female who has been referred to [place] by Lauree Chandler, NP for evaluation and management of elevated serum globulin levels. The pt reports that she is doing well overall.  The pt reports that her appetite has decreased recently but that she eats a healthy diet. She notes mild fatigue. Denies new bone pain, back pain, hip pain, infection concerns, and any other new concerns.  She has been getting a Prolia shot every 6 months for the past 2 years. She has also been taking Aricept but notes that her memory is worsening. Her daughter is her health care POA, but the pt makes her own healthcare decisions at this time.  Most recent lab results (06/14/2019) of CBC is as follows: all values are WNL except for HGB at 11.4, MCH at 26.8. 06/21/2019 UPEP revealed protein/creat ratio at 209, total protein at 29, and two abnormal protein bands 06/14/2019 SPEP revealed total protein at 10.1, Alpha 2 at 1.0, gamma globulin at 4.1, abnormal protein band1 at 3.9  On review of systems, pt reports some fatigue, decreased appetite, and denies new bone pain, back pain, hip pain, infection concerns, and any other symptoms.   On PMHx the pt reports that she is taking BP medication  On Social Hx the pt reports that she quit smoking in 2004 and does not drink alcohol   INTERVAL HISTORY:  I connected with Jasmine Fletcher on 09/08/19 at 10:40 AM EDT by telephone visit and verified that I am speaking with the correct person using two identifiers.   I discussed the limitations, risks, security and privacy concerns of performing an evaluation and management service by telemedicine and the  availability of in-person appointments. I also discussed with the patient that there may be a patient responsible charge related to this service. The patient expressed understanding and agreed to proceed.   Other persons participating in the visit and their role in the encounter: patients daughter   Patient's location: Home  Provider's location: Clinic   Chief Complaint: Elevated serum globulin levels/Monoclonal paraproteinemia concerning for plasma cell dyscrasia   Jasmine Fletcher is a 85 y.o. female here for evaluation and management of plasma cell dyscrasia. The patient's last visit with [place] was on 08/12/2019. The pt reports that she is doing well overall.  The pt reports no acute new symptoms  Of note since the patient's last visit, pt has had a bone marrow biopsy ([id number]) completed on 09/02/2019 - results were discussed in details.   On review of systems, pt reports no new bone pain. Some discomfort from her 2nd bone marrow biopsy   MEDICAL HISTORY:  Past Medical History:  Diagnosis Date  . Hyperlipidemia   . Hypertension   . Osteoporosis     SURGICAL HISTORY: Past Surgical History:  Procedure Laterality Date  . ABDOMINAL HYSTERECTOMY  1998    SOCIAL HISTORY: Social History   Socioeconomic History  . Marital status: Single    Spouse name: Not on file  . Number of children: Not on file  . Years of education: Not on file  . Highest education level: Not on file  Occupational History  . Not on file  Social Needs  . Financial  resource strain: Not hard at all  . Food insecurity    Worry: Never true    Inability: Never true  . Transportation needs    Medical: No    Non-medical: No  Tobacco Use  . Smoking status: Former Smoker    Packs/day: 1.00    Quit date: 12/08/2006    Years since quitting: 12.7  . Smokeless tobacco: Never Used  . Tobacco comment: Quit t age 68   Substance and Sexual Activity  . Alcohol use: No    Alcohol/week: 0.0 standard drinks  .  Drug use: No  . Sexual activity: Not on file  Lifestyle  . Physical activity    Days per week: 3 days    Minutes per session: 30 min  . Stress: Not at all  Relationships  . Social Herbalist on phone: Three times a week    Gets together: Three times a week    Attends religious service: Never    Active member of club or organization: No    Attends meetings of clubs or organizations: Never    Relationship status: Never married  . Intimate partner violence    Fear of current or ex partner: No    Emotionally abused: No    Physically abused: No    Forced sexual activity: No  Other Topics Concern  . Not on file  Social History Narrative  . Not on file    FAMILY HISTORY: Family History  Problem Relation Age of Onset  . Cancer Father   . Dementia Sister   . Breast cancer Maternal Grandmother   . Breast cancer Paternal Grandmother     ALLERGIES:  has No Known Allergies.  MEDICATIONS:  Current Outpatient Medications  Medication Sig Dispense Refill  . aspirin 81 MG tablet Take 81 mg by mouth daily.    . calcium-vitamin D (OSCAL 500/200 D-3) 500-200 MG-UNIT per tablet Take 1 tablet by mouth 2 (two) times daily. 60 tablet 3  . denosumab (PROLIA) 60 MG/ML SOSY injection Inject 60 mg into the skin every 6 (six) months. 1 mL 0  . donepezil (ARICEPT) 10 MG tablet Take 1 tablet (10 mg total) by mouth daily. 90 tablet 1  . feeding supplement (BOOST HIGH PROTEIN) LIQD Take 1 Container by mouth daily.    . hydrochlorothiazide (HYDRODIURIL) 25 MG tablet Take 1 tablet (25 mg total) by mouth daily. 90 tablet 1  . lisinopril (PRINIVIL,ZESTRIL) 5 MG tablet Take 1 tablet (5 mg total) by mouth daily. 90 tablet 1  . simvastatin (ZOCOR) 20 MG tablet TAKE 1 TABLET(20 MG) BY MOUTH DAILY 90 tablet 1  . traZODone (DESYREL) 50 MG tablet TAKE 1/2 TABLET(25 MG) BY MOUTH AT BEDTIME FOR SLEEP 45 tablet 1   No current facility-administered medications for this visit.     REVIEW OF SYSTEMS:    A 10+ POINT REVIEW OF SYSTEMS WAS OBTAINED including neurology, dermatology, psychiatry, cardiac, respiratory, lymph, extremities, GI, GU, Musculoskeletal, constitutional, breasts, reproductive, HEENT.  All pertinent positives are noted in the HPI.  All others are negative.     PHYSICAL EXAMINATION: There were no vitals filed for this visit. Wt Readings from Last 3 Encounters:  08/12/19 150 lb 11.2 oz (68.4 kg)  07/28/19 150 lb 1.6 oz (68.1 kg)  06/21/19 152 lb (68.9 kg)   There is no height or weight on file to calculate BMI.    ECOG FS:2 - Symptomatic, <50% confined to bed  Telehealth Visit 09/09/2019  LABORATORY DATA:  I have reviewed the data as listed  . CBC Latest Ref Rng & Units 09/02/2019 08/05/2019 07/28/2019  WBC 4.0 - 10.5 K/uL 4.5 4.7 5.2  Hemoglobin 12.0 - 15.0 g/dL 11.4(L) 11.6(L) 10.8(L)  Hematocrit 36.0 - 46.0 % 37.4 38.0 34.5(L)  Platelets 150 - 400 K/uL 217 209 283    . CMP Latest Ref Rng & Units 07/28/2019 06/14/2019 06/14/2019  Glucose 70 - 99 mg/dL 83 - -  BUN 8 - 23 mg/dL 25(H) - -  Creatinine 0.44 - 1.00 mg/dL 1.15(H) - -  Sodium 135 - 145 mmol/L 137 - -  Potassium 3.5 - 5.1 mmol/L 3.9 - -  Chloride 98 - 111 mmol/L 103 - -  CO2 22 - 32 mmol/L 25 - -  Calcium 8.9 - 10.3 mg/dL 9.7 - -  Total Protein 6.5 - 8.1 g/dL 11.1(H) 10.1(H) 9.9(H)  Total Bilirubin 0.3 - 1.2 mg/dL 0.2(L) - 0.2  Alkaline Phos 38 - 126 U/L 64 - -  AST 15 - 41 U/L 17 - 16  ALT 0 - 44 U/L 9 - 8   Component     Latest Ref Rng & Units 06/21/2019 07/28/2019 08/01/2019  Total Protein, Urine-UPE24     Not Estab. mg/dL   8.2  Total Protein, Urine-Ur/day     30 - 150 mg/24 hr   180 (H)  ALBUMIN, U     %   19.7  ALPHA 1 URINE     %   1.5  Alpha 2, Urine     %   6.0  % BETA, Urine     %   21.4  GAMMA GLOBULIN URINE     %   51.4  Free Kappa Lt Chains,Ur     0.63 - 113.79 mg/L   383.74 (H)  Free Lambda Lt Chains,Ur     0.47 - 11.77 mg/L   0.68  Free Kappa/Lambda Ratio     1.03 -  31.76   564.32 (H)  Immunofixation Result, Urine        Comment  Total Volume        2,200  M-SPIKE %, Urine     Not Observed %   28.3 (H)  M-Spike, mg/24 hr     Not Observed mg/24 hr   51 (H)  NOTE:        Comment  IgG (Immunoglobin G), Serum     586 - 1,602 mg/dL  5,351 (H)   IgA     64 - 422 mg/dL  99   IgM (Immunoglobulin M), Srm     26 - 217 mg/dL  15 (L)   Total Protein ELP     6.0 - 8.5 g/dL  10.2 (H)   Albumin SerPl Elph-Mcnc     2.9 - 4.4 g/dL  4.1   Alpha 1     0.0 - 0.4 g/dL  0.3   Alpha2 Glob SerPl Elph-Mcnc     0.4 - 1.0 g/dL  0.8   B-Globulin SerPl Elph-Mcnc     0.7 - 1.3 g/dL  1.1   Gamma Glob SerPl Elph-Mcnc     0.4 - 1.8 g/dL  3.9 (H)   M Protein SerPl Elph-Mcnc     Not Observed g/dL  3.4 (H)   Globulin, Total     2.2 - 3.9 g/dL  6.1 (H)   Albumin/Glob SerPl     0.7 - 1.7  0.7   IFE 1  Comment   Please Note (HCV):       Comment   Creatinine, Urine     20 - 275 mg/dL 139    Protein/Creat Ratio     21 - 161 mg/g creat 209 (H)    Protein/Creatinine Ratio     0.021 - 0.16 mg/mg creat 0.209 (H)    Total Protein, Urine     5 - 24 mg/dL 29 (H)    Albumin     % 44    Alpha-1-Globulin, U     % 3    ALPHA-2-GLOBULIN, U     % 12    Beta Globulin, U     % 9    Gamma Globulin, U     % 33    Abnormal Protein Band     NONE DETEC mg/dL 7 (H)    Abnormal Protein Band     NONE DETEC mg/dL 1 (H)    Interpretation          Iron     41 - 142 ug/dL  39 (L)   TIBC     236 - 444 ug/dL  248   Saturation Ratios     21 - 57 %  16 (L)   UIBC     120 - 384 ug/dL  208   Kappa free light chain     3.3 - 19.4 mg/L  412.0 (H)   Lamda free light chains     5.7 - 26.3 mg/L  8.0   Kappa, lamda light chain ratio     0.26 - 1.65  51.50 (H)   Sed Rate     0 - 22 mm/hr  110 (H)   LDH     98 - 192 U/L  135   Ferritin     11 - 307 ng/mL  154   Vitamin B12     180 - 914 pg/mL  709     09/02/2019 Bone Marrow Biopsy ([id number])   08/05/2019 Bone  Marrow Biopsy ([id number])        RADIOGRAPHIC STUDIES: I have personally reviewed the radiological images as listed and agreed with the findings in the report. Ct Biopsy  Result Date: 09/02/2019 CLINICAL DATA:  monoclonal paraproteinemia EXAM: CT GUIDED DEEP ILIAC BONE ASPIRATION AND CORE BIOPSY TECHNIQUE: Patient was placed prone on the CT gantry and limited axial scans through the pelvis were obtained. Appropriate skin entry site was identified. Skin site was marked, prepped with chlorhexidine, draped in usual sterile fashion, and infiltrated locally with 1% lidocaine. Intravenous Fentanyl 100mg and Versed 1.565mwere administered as conscious sedation during continuous monitoring of the patient's level of consciousness and physiological / cardiorespiratory status by the radiology RN, with a total moderate sedation time of 10 minutes. Under CT fluoroscopic guidance an 11-gauge Cook trocar bone needle was advanced into the left iliac bone just lateral to the sacroiliac joint. Once needle tip position was confirmed, core and aspiration samples were obtained, submitted to pathology for approval. Post procedure scans show no hematoma or fracture. Patient tolerated procedure well. COMPLICATIONS: COMPLICATIONS none IMPRESSION: 1. Technically successful CT guided left iliac bone core and aspiration biopsy. Electronically Signed   By: D Lucrezia Europe.D.   On: 09/02/2019 12:07   Dg Bone Density  Result Date: 09/06/2019 EXAM: DUAL X-RAY ABSORPTIOMETRY (DXA) FOR BONE MINERAL DENSITY IMPRESSION: Referring Physician:  EUSherrie Mustache Your patient completed a BMD test using Lunar IDXA DXA system ( analysis version: 16 ) manufactured by  [employer]. Technologist: CG PATIENT: Name: Jasmine Fletcher, Jasmine Fletcher Patient ID: [id number] Birth Date: 05/18/1931 Height: 66.5 in. Sex: Female Measured: 09/06/2019 Weight: 147.6 lbs. Indications: Advanced Age, Estrogen Deficient, Family History of Osteoporosis, Height Loss (781.91), Hx  of tobacco use, multiple broken bones, Postmenopausal Fractures: Ankle, Knee Treatments: Calcium (E943.0), Prolia, Vitamin D (E933.5) ASSESSMENT: The BMD measured at Forearm Radius 33% is 0.589 g/cm2 with a T-score of -3.4. This patient is considered osteoporotic according to [facility]) criteria. There has been a statistically significant increase in BMD of Left hip and Total Mean since prior exam dated 06/18/2017. The scan quality is good. Lumbar spine was not utilized due to being excluded on prior exam. Site Region Measured Date Measured Age YA BMD Significant CHANGE T-score Left Forearm Radius 33% 09/06/2019 86.1 -3.4 0.589 g/cm2 Left Forearm Radius 33% 06/18/2017 83.9 -3.7 0.558 g/cm2 DualFemur Total Left 09/06/2019 86.1 -2.0 0.757 g/cm2 * DualFemur Total Left 06/18/2017 83.9 -2.5 0.697 g/cm2 DualFemur Total Mean 09/06/2019 86.1 -1.9 0.771 g/cm2 * DualFemur Total Mean 06/18/2017 83.9 -2.2 0.728 g/cm2 World Health Organization [facility]) criteria for post-menopausal, Caucasian Women: Normal       T-score at or above -1 SD Osteopenia   T-score between -1 and -2.5 SD Osteoporosis T-score at or below -2.5 SD RECOMMENDATION: 1. All patients should optimize calcium and vitamin D intake. 2. Consider FDA approved medical therapies in postmenopausal women and men aged 44 years and older, based on the following: a. A hip or vertebral (clinical or morphometric) fracture b. T- score < or = -2.5 at the femoral neck or spine after appropriate evaluation to exclude secondary causes c. Low bone mass (T-score between -1.0 and -2.5 at the femoral neck or spine) and a 10 year probability of a hip fracture > or = 3% or a 10 year probability of a major osteoporosis-related fracture > or = 20% based on the [place]-adapted WHO algorithm d. Clinician judgment and/or patient preferences may indicate treatment for people with 10-year fracture probabilities above or below these levels FOLLOW-UP: Patients with diagnosis of  osteoporosis or at high risk for fracture should have regular bone mineral density tests. For patients eligible for Medicare, routine testing is allowed once every 2 years. The testing frequency can be increased to one year for patients who have rapidly progressing disease, those who are receiving or discontinuing medical therapy to restore bone mass, or have additional risk factors. [facility] Radiology Electronically Signed   By: Lowella Grip III M.D.   On: 09/06/2019 08:51   Mm Digital Screening Bilateral  Result Date: 09/06/2019 CLINICAL DATA:  Screening. EXAM: DIGITAL SCREENING BILATERAL MAMMOGRAM WITH CAD COMPARISON:  Previous exam(s). ACR Breast Density Category b: There are scattered areas of fibroglandular density. FINDINGS: There are no findings suspicious for malignancy. Images were processed with CAD. IMPRESSION: No mammographic evidence of malignancy. A result letter of this screening mammogram will be mailed directly to the patient. RECOMMENDATION: Screening mammogram in one year. (Code:SM-B-01Y) BI-RADS CATEGORY  1: Negative. Electronically Signed   By: Curlene Dolphin M.D.   On: 09/06/2019 11:23   Ct Bone Marrow Biopsy & Aspiration  Result Date: 09/02/2019 CLINICAL DATA:  monoclonal paraproteinemia EXAM: CT GUIDED DEEP ILIAC BONE ASPIRATION AND CORE BIOPSY TECHNIQUE: Patient was placed prone on the CT gantry and limited axial scans through the pelvis were obtained. Appropriate skin entry site was identified. Skin site was marked, prepped with chlorhexidine, draped in usual sterile fashion, and infiltrated locally with 1% lidocaine. Intravenous Fentanyl  10mg and Versed 1.567mwere administered as conscious sedation during continuous monitoring of the patient's level of consciousness and physiological / cardiorespiratory status by the radiology RN, with a total moderate sedation time of 10 minutes. Under CT fluoroscopic guidance an 11-gauge Cook trocar bone needle was advanced into the left  iliac bone just lateral to the sacroiliac joint. Once needle tip position was confirmed, core and aspiration samples were obtained, submitted to pathology for approval. Post procedure scans show no hematoma or fracture. Patient tolerated procedure well. COMPLICATIONS: COMPLICATIONS none IMPRESSION: 1. Technically successful CT guided left iliac bone core and aspiration biopsy. Electronically Signed   By: D Lucrezia Europe.D.   On: 09/02/2019 12:07    ASSESSMENT & PLAN:  Jasmine Fletcher a [age].o. female with:  #1 IgG Kappa Monoclonal paraproteinemia with M protein of 3.9 -highly concerning for MM  06/14/2019 SPEP revealed total protein at 10.1, alpha 2 at 1.0, gamma globulin at 4.1, Abnormal protein band1 at 3.9 UPEP +ve for Bence Jones protein  07/29/2019 PET scan with results revealing "Two right thyroid nodules are both highly hypermetabolic. A significant minority of hypermetabolic thyroid lesions represent thyroid malignancy. Further workup with thyroid ultrasound is recommended. A small left upper para-aortic lymph node is mildly hypermetabolic with maximum SUV 4.7, significance uncertain. No significant abnormal accentuated osseous activity or compelling focal findings of active multiple myeloma in the skeleton. Hypodense lesions in the liver are photopenic and likely benign  Hyperdense small lesions of the left kidney upper pole are too small to characterize but statistically most likely to be complex cysts. If the patient has hematuria or if otherwise clinically warranted, renal protocol MRI with and without contrast could be utilized for further characterization. Mild mid to distal accentuated thoracic esophageal activity with maximum SUV of 4.8; mildly accentuated activity in the gastric cardia/gastroesophageal junction with maximum SUV 5.8. Both could be physiologic but rarely low-grade malignancy could have a similar appearance, upper endoscopy could be utilized for further workup if clinically  warranted. Other imaging findings of potential clinical significance: Chronic ischemic microvascular white matter disease intracranially. Aortic Atherosclerosis (ICD10-I70.0). Mild cardiomegaly. Biapical pleuroparenchymal scarring and probable scarring in the superior segment left lower lobe. Calcified uterine fibroids. Thoracolumbar scoliosis. No clear bone lesions."  08/05/2019 bone marrow biopsy ([id number]) with results revealing "no monoclonal b cell population or abnormal T cell phenotype identified. Atypical lymphoplasmacytoid aggregates - not clear for myeloma  #2. FDG avid thyroid nodules  #3.  Patient Active Problem List   Diagnosis Date Noted  . Dementia without behavioral disturbance ([facility] [place]  . Mixed hyperlipidemia 11/29/2013  . Osteoporosis 03/15/2013  . BRADYCARDIA 02/17/2009  . HYPERTENSION, BENIGN ESSENTIAL 02/16/2009  . ANEMIA, HYPOCHROMIC 07/26/2007  . DISORDER, TOBACCO USE 07/26/2007  . DEPRESSION 07/26/2007  . CARDIAC CATHETERIZATION, LEFT, HX OF 07/26/2007    PLAN: -Discussed 09/02/2019 bone marrow biopsy ([id number]) with results revealing small popoulation of monoclonal plasma cell. Does not appear to co-related with Mprotein levels and seem to be underrepresented. -patient would not want any additional BM Bx -no significant anemia. No hypercalcemia , no overt renal insufficiency - PET/CT - no overt bone lesions. -discussed that the patient likely has multiple myeloma active vs smoldering. -she prefers a conservative process with monitoring at this time   FOLLOW UP: RTC with Dr KaIrene Limboith labs in 2 months  The total time spent in the appt was 25 minutes and more than 50% was on counseling and direct patient cares.  All  of the patient's questions were answered with apparent satisfaction. The patient knows to call the clinic with any problems, questions or concerns.   Sullivan Lone MD MS AAHIVMS [facility] Hematology/Oncology Physician [facility]  (Office):       [phone] (Work cell):  [phone] (Fax):           [phone]  09/08/2019 10:40 PM  I, Jacqualyn Posey, am acting as a Education administrator for Dr. Sullivan Lone.   .I have reviewed the above documentation for accuracy and completeness, and I agree with the above. Brunetta Genera MD

## 2019-09-08 NOTE — Telephone Encounter (Signed)
Confirmed appt and verified info. °

## 2019-09-09 ENCOUNTER — Inpatient Hospital Stay: Payer: Medicare Other | Attending: Hematology | Admitting: Hematology

## 2019-09-09 DIAGNOSIS — C9000 Multiple myeloma not having achieved remission: Secondary | ICD-10-CM | POA: Diagnosis not present

## 2019-09-09 DIAGNOSIS — M810 Age-related osteoporosis without current pathological fracture: Secondary | ICD-10-CM

## 2019-09-09 DIAGNOSIS — E559 Vitamin D deficiency, unspecified: Secondary | ICD-10-CM

## 2019-09-09 DIAGNOSIS — R771 Abnormality of globulin: Secondary | ICD-10-CM | POA: Diagnosis present

## 2019-09-09 DIAGNOSIS — D472 Monoclonal gammopathy: Secondary | ICD-10-CM | POA: Insufficient documentation

## 2019-09-09 MED ORDER — ERGOCALCIFEROL 1.25 MG (50000 UT) PO CAPS: 50000.0000 [IU] | ORAL_CAPSULE | ORAL | 2 refills | Status: DC

## 2019-09-12 ENCOUNTER — Encounter (HOSPITAL_COMMUNITY): Payer: Self-pay | Admitting: Hematology

## 2019-10-17 ENCOUNTER — Encounter: Payer: Self-pay | Admitting: Nurse Practitioner

## 2019-10-17 ENCOUNTER — Other Ambulatory Visit: Payer: Self-pay

## 2019-10-17 ENCOUNTER — Ambulatory Visit (INDEPENDENT_AMBULATORY_CARE_PROVIDER_SITE_OTHER): Payer: Medicare Other | Admitting: Nurse Practitioner

## 2019-10-17 VITALS — BP 124/70 | HR 92 | Temp 97.9°F | Ht 67.0 in | Wt 151.2 lb

## 2019-10-17 DIAGNOSIS — M810 Age-related osteoporosis without current pathological fracture: Secondary | ICD-10-CM

## 2019-10-17 DIAGNOSIS — C9000 Multiple myeloma not having achieved remission: Secondary | ICD-10-CM | POA: Diagnosis not present

## 2019-10-17 DIAGNOSIS — Z23 Encounter for immunization: Secondary | ICD-10-CM

## 2019-10-17 DIAGNOSIS — F0390 Unspecified dementia without behavioral disturbance: Secondary | ICD-10-CM | POA: Diagnosis not present

## 2019-10-17 DIAGNOSIS — E559 Vitamin D deficiency, unspecified: Secondary | ICD-10-CM | POA: Diagnosis not present

## 2019-10-17 DIAGNOSIS — H6122 Impacted cerumen, left ear: Secondary | ICD-10-CM | POA: Diagnosis not present

## 2019-10-17 DIAGNOSIS — D472 Monoclonal gammopathy: Secondary | ICD-10-CM | POA: Insufficient documentation

## 2019-10-17 DIAGNOSIS — I10 Essential (primary) hypertension: Secondary | ICD-10-CM

## 2019-10-17 DIAGNOSIS — R531 Weakness: Secondary | ICD-10-CM

## 2019-10-17 NOTE — Progress Notes (Signed)
Careteam: Patient Care Team: Lauree Chandler, NP as PCP - General (Geriatric Medicine)  Advanced Directive information Does Patient Have a Medical Advance Directive?: No, Would patient like information on creating a medical advance directive?: No - Patient declined  No Known Allergies  Chief Complaint  Patient presents with  . Medical Management of Chronic Issues    4 month follow-up   . Orders    Home Health Aid request   . Medication Management    Stopped Trazodone 1 month ago, medication caused crazy dreams and did not help patient sleep   . Immunizations    Flu Vaccine today      HPI: Patient is a 85 y.o. female seen in the office today for routine follow up. Here alone today  Dementia- reports she is okay with her memory loss. Daughter helps her but she lives alone. Continues on aricept   Osteoporosis- continues on prolia.   htn- continues on lisinopril and hctz  Weight loss- taking boost daily, weight has been stable.   Hyperlipidemia- continues on zocor.   Insomnia-stopped trazodone, sleeping well at night   Having trouble getting in and out of the bathtub. Generalized weakness. Used to be very active but due to COVID has slowed down. No pain. No falls.    Review of Systems:  Review of Systems  Constitutional: Negative for chills, fever and weight loss.  HENT: Negative for hearing loss.   Respiratory: Negative for cough, sputum production and shortness of breath.   Cardiovascular: Negative for chest pain, palpitations and leg swelling.  Gastrointestinal: Negative for abdominal pain, constipation, diarrhea and heartburn.  Genitourinary: Negative for dysuria, frequency and urgency.  Musculoskeletal: Negative for back pain, falls, joint pain and myalgias.  Skin: Negative.   Neurological: Negative for dizziness and headaches.  Psychiatric/Behavioral: Positive for memory loss. Negative for depression. The patient is not nervous/anxious and does not have  insomnia.     Past Medical History:  Diagnosis Date  . Hyperlipidemia   . Hypertension   . Osteoporosis    Past Surgical History:  Procedure Laterality Date  . ABDOMINAL HYSTERECTOMY  1998   Social History:   reports that she quit smoking about 16 years ago. She smoked 1.00 pack per day. She has never used smokeless tobacco. She reports that she does not drink alcohol or use drugs.  Family History  Problem Relation Age of Onset  . Cancer Father   . Dementia Sister   . Breast cancer Maternal Grandmother   . Breast cancer Paternal Grandmother     Medications: Patient's Medications  New Prescriptions   No medications on file  Previous Medications   ASPIRIN 81 MG TABLET    Take 81 mg by mouth daily.   CALCIUM-VITAMIN D (OSCAL 500/200 D-3) 500-200 MG-UNIT PER TABLET    Take 1 tablet by mouth 2 (two) times daily.   DENOSUMAB (PROLIA) 60 MG/ML SOSY INJECTION    Inject 60 mg into the skin every 6 (six) months.   DONEPEZIL (ARICEPT) 10 MG TABLET    Take 1 tablet (10 mg total) by mouth daily.   ERGOCALCIFEROL (VITAMIN D2) 1.25 MG (50000 UT) CAPSULE    Take 1 capsule (50,000 Units total) by mouth once a week.   FEEDING SUPPLEMENT (BOOST HIGH PROTEIN) LIQD    Take 1 Container by mouth daily.   HYDROCHLOROTHIAZIDE (HYDRODIURIL) 25 MG TABLET    Take 1 tablet (25 mg total) by mouth daily.   LISINOPRIL (PRINIVIL,ZESTRIL) 5 MG TABLET  Take 1 tablet (5 mg total) by mouth daily.   SIMVASTATIN (ZOCOR) 20 MG TABLET    TAKE 1 TABLET(20 MG) BY MOUTH DAILY  Modified Medications   No medications on file  Discontinued Medications   TRAZODONE (DESYREL) 50 MG TABLET    TAKE 1/2 TABLET(25 MG) BY MOUTH AT BEDTIME FOR SLEEP    Physical Exam:  Vitals:   10/17/19 0925  BP: 124/70  Pulse: 92  Temp: 97.9 F (36.6 C)  TempSrc: Temporal  SpO2: 97%  Weight: 151 lb 3.2 oz (68.6 kg)  Height: 5\' 7"  (1.702 m)   Body mass index is 23.68 kg/m. Wt Readings from Last 3 Encounters:  10/17/19 151 lb  3.2 oz (68.6 kg)  08/12/19 150 lb 11.2 oz (68.4 kg)  07/28/19 150 lb 1.6 oz (68.1 kg)    Physical Exam Constitutional:      General: She is not in acute distress.    Appearance: She is well-developed. She is not diaphoretic.  HENT:     Head: Normocephalic and atraumatic.     Left Ear: External ear normal.     Mouth/Throat:     Pharynx: No oropharyngeal exudate.  Eyes:     Conjunctiva/sclera: Conjunctivae normal.     Pupils: Pupils are equal, round, and reactive to light.  Neck:     Musculoskeletal: Normal range of motion and neck supple.  Cardiovascular:     Rate and Rhythm: Normal rate and regular rhythm.     Heart sounds: Normal heart sounds.  Pulmonary:     Effort: Pulmonary effort is normal.     Breath sounds: Normal breath sounds.  Abdominal:     General: Bowel sounds are normal.     Palpations: Abdomen is soft.  Musculoskeletal:        General: No tenderness.  Skin:    General: Skin is warm and dry.  Neurological:     Mental Status: She is alert and oriented to person, place, and time.    Labs reviewed: Basic Metabolic Panel: Recent Labs    11/15/18 1007 06/14/19 0948 07/28/19 1124  NA 137 138 137  K 3.8 3.9 3.9  CL 102 103 103  CO2 30 28 25   GLUCOSE 79 84 83  BUN 19 34* 25*  CREATININE 1.10* 1.08* 1.15*  CALCIUM 9.7 9.8 9.7  TSH  --  5.27*  --    Liver Function Tests: Recent Labs    06/14/19 0948 07/28/19 1124  AST 16  16 17   ALT 8  8 9   ALKPHOS  --  64  BILITOT 0.2  0.2 0.2*  PROT 10.1*  9.9*  9.9* 11.1*  ALBUMIN  --  3.5   No results for input(s): LIPASE, AMYLASE in the last 8760 hours. No results for input(s): AMMONIA in the last 8760 hours. CBC: Recent Labs    07/28/19 1124 08/05/19 0835 09/02/19 0802  WBC 5.2 4.7 4.5  NEUTROABS 3.2 2.6 2.6  HGB 10.8* 11.6* 11.4*  HCT 34.5* 38.0 37.4  MCV 86.7 89.4 87.6  PLT 283 209 217   Lipid Panel: Recent Labs    06/14/19 0948  CHOL 149  HDL 43*  LDLCALC 85  TRIG 111  CHOLHDL  3.5   TSH: Recent Labs    06/14/19 0948  TSH 5.27*   A1C: No results found for: HGBA1C   Assessment/Plan 1. Osteoporosis, unspecified osteoporosis type, unspecified pathological fracture presence -continues on prolia, cal and vit d. Has upcoming appt scheduled in January for injection.  2. Smoldering myeloma ([place]) -following with oncologist, PET/CT show no overt bone lesions. She prefers conservative treatment with ongoing monitoring at this time. Following up in 2 months  3. Vitamin D deficiency -continues on vit d 50,000 units weekly.   4. Dementia without behavioral disturbance, unspecified dementia type ([place]) -reports symptoms are stable, being at home has worsening symptoms slightly. Continues on aricept 10 mg daily. Here today alone and is a fairly good historian.   5. HYPERTENSION, BENIGN ESSENTIAL -controlled on current regimen  6. Weakness -some days worse than others however pt fearful of falls.  - Ambulatory referral to [facility] for home safety evaluation  7. Need for influenza vaccination - Flu Vaccine QUAD High Dose(Fluad)  8. Left ear impacted cerumen -flushed, some of the cerumen was able to be removed via grabber but not the total amount of wax. TM was visualized however. Pt tolerated well.   Next appt: 2 month follow up scheduled with oncologist, will have her follow up here in 4 months for routine follow up.  Carlos American. [place], [facility] [phone]

## 2019-10-24 DIAGNOSIS — F0390 Unspecified dementia without behavioral disturbance: Secondary | ICD-10-CM

## 2019-10-24 DIAGNOSIS — F329 Major depressive disorder, single episode, unspecified: Secondary | ICD-10-CM

## 2019-10-24 DIAGNOSIS — C9000 Multiple myeloma not having achieved remission: Secondary | ICD-10-CM

## 2019-10-24 DIAGNOSIS — D630 Anemia in neoplastic disease: Secondary | ICD-10-CM

## 2019-10-24 DIAGNOSIS — I10 Essential (primary) hypertension: Secondary | ICD-10-CM

## 2019-10-24 DIAGNOSIS — G4700 Insomnia, unspecified: Secondary | ICD-10-CM

## 2019-10-24 DIAGNOSIS — M810 Age-related osteoporosis without current pathological fracture: Secondary | ICD-10-CM

## 2019-10-24 DIAGNOSIS — E559 Vitamin D deficiency, unspecified: Secondary | ICD-10-CM

## 2019-11-09 ENCOUNTER — Other Ambulatory Visit: Payer: Self-pay

## 2019-11-09 ENCOUNTER — Inpatient Hospital Stay: Payer: Medicare Other | Attending: Hematology

## 2019-11-09 DIAGNOSIS — R771 Abnormality of globulin: Secondary | ICD-10-CM | POA: Diagnosis not present

## 2019-11-09 DIAGNOSIS — D472 Monoclonal gammopathy: Secondary | ICD-10-CM | POA: Insufficient documentation

## 2019-11-09 DIAGNOSIS — E559 Vitamin D deficiency, unspecified: Secondary | ICD-10-CM

## 2019-11-09 DIAGNOSIS — C9000 Multiple myeloma not having achieved remission: Secondary | ICD-10-CM

## 2019-11-09 DIAGNOSIS — M818 Other osteoporosis without current pathological fracture: Secondary | ICD-10-CM | POA: Diagnosis not present

## 2019-11-09 DIAGNOSIS — M810 Age-related osteoporosis without current pathological fracture: Secondary | ICD-10-CM

## 2019-11-09 DIAGNOSIS — I10 Essential (primary) hypertension: Secondary | ICD-10-CM | POA: Insufficient documentation

## 2019-11-09 LAB — CBC WITH DIFFERENTIAL/PLATELET
Abs Immature Granulocytes: 0.01 10*3/uL (ref 0.00–0.07)
Basophils Absolute: 0 10*3/uL (ref 0.0–0.1)
Basophils Relative: 1 %
Eosinophils Absolute: 0 10*3/uL (ref 0.0–0.5)
Eosinophils Relative: 1 %
HCT: 38.8 % (ref 36.0–46.0)
Hemoglobin: 12 g/dL (ref 12.0–15.0)
Immature Granulocytes: 0 %
Lymphocytes Relative: 31 %
Lymphs Abs: 1.4 10*3/uL (ref 0.7–4.0)
MCH: 26.7 pg (ref 26.0–34.0)
MCHC: 30.9 g/dL (ref 30.0–36.0)
MCV: 86.4 fL (ref 80.0–100.0)
Monocytes Absolute: 0.4 10*3/uL (ref 0.1–1.0)
Monocytes Relative: 9 %
Neutro Abs: 2.7 10*3/uL (ref 1.7–7.7)
Neutrophils Relative %: 58 %
Platelets: 235 10*3/uL (ref 150–400)
RBC: 4.49 MIL/uL (ref 3.87–5.11)
RDW: 14.2 % (ref 11.5–15.5)
WBC: 4.6 10*3/uL (ref 4.0–10.5)
nRBC: 0 % (ref 0.0–0.2)

## 2019-11-09 LAB — CMP (CANCER CENTER ONLY)
ALT: 9 U/L (ref 0–44)
AST: 14 U/L — ABNORMAL LOW (ref 15–41)
Albumin: 3.4 g/dL — ABNORMAL LOW (ref 3.5–5.0)
Alkaline Phosphatase: 58 U/L (ref 38–126)
Anion gap: 9 (ref 5–15)
BUN: 22 mg/dL (ref 8–23)
CO2: 27 mmol/L (ref 22–32)
Calcium: 10.1 mg/dL (ref 8.9–10.3)
Chloride: 102 mmol/L (ref 98–111)
Creatinine: 1.02 mg/dL — ABNORMAL HIGH (ref 0.44–1.00)
GFR, Est AFR Am: 58 mL/min — ABNORMAL LOW (ref >60)
GFR, Estimated: 50 mL/min — ABNORMAL LOW (ref >60)
Glucose, Bld: 81 mg/dL (ref 70–99)
Potassium: 4 mmol/L (ref 3.5–5.1)
Sodium: 138 mmol/L (ref 135–145)
Total Bilirubin: 0.3 mg/dL (ref 0.3–1.2)
Total Protein: 10.3 g/dL — ABNORMAL HIGH (ref 6.5–8.1)

## 2019-11-09 LAB — VITAMIN D 25 HYDROXY (VIT D DEFICIENCY, FRACTURES): Vit D, 25-Hydroxy: 87.54 ng/mL (ref 30–100)

## 2019-11-10 LAB — KAPPA/LAMBDA LIGHT CHAINS
Kappa free light chain: 532.7 mg/L — ABNORMAL HIGH (ref 3.3–19.4)
Kappa, lambda light chain ratio: 54.92 — ABNORMAL HIGH (ref 0.26–1.65)
Lambda free light chains: 9.7 mg/L (ref 5.7–26.3)

## 2019-11-10 LAB — CALCIUM, IONIZED: Calcium, Ionized, Serum: 5.8 mg/dL — ABNORMAL HIGH (ref 4.5–5.6)

## 2019-11-11 LAB — MULTIPLE MYELOMA PANEL, SERUM
Albumin SerPl Elph-Mcnc: 4 g/dL (ref 2.9–4.4)
Albumin/Glob SerPl: 0.7 (ref 0.7–1.7)
Alpha 1: 0.2 g/dL (ref 0.0–0.4)
Alpha2 Glob SerPl Elph-Mcnc: 0.8 g/dL (ref 0.4–1.0)
B-Globulin SerPl Elph-Mcnc: 0.9 g/dL (ref 0.7–1.3)
Gamma Glob SerPl Elph-Mcnc: 4 g/dL — ABNORMAL HIGH (ref 0.4–1.8)
Globulin, Total: 5.9 g/dL — ABNORMAL HIGH (ref 2.2–3.9)
IgA: 96 mg/dL (ref 64–422)
IgG (Immunoglobin G), Serum: 4567 mg/dL — ABNORMAL HIGH (ref 586–1602)
IgM (Immunoglobulin M), Srm: 14 mg/dL — ABNORMAL LOW (ref 26–217)
M Protein SerPl Elph-Mcnc: 3.5 g/dL — ABNORMAL HIGH
Total Protein ELP: 9.9 g/dL — ABNORMAL HIGH (ref 6.0–8.5)

## 2019-11-16 ENCOUNTER — Inpatient Hospital Stay (HOSPITAL_BASED_OUTPATIENT_CLINIC_OR_DEPARTMENT_OTHER): Payer: Medicare Other | Admitting: Hematology

## 2019-11-16 ENCOUNTER — Other Ambulatory Visit: Payer: Self-pay

## 2019-11-16 VITALS — BP 162/74 | HR 77 | Temp 97.8°F | Resp 18 | Ht 67.0 in | Wt 151.9 lb

## 2019-11-16 DIAGNOSIS — R771 Abnormality of globulin: Secondary | ICD-10-CM | POA: Diagnosis not present

## 2019-11-16 DIAGNOSIS — C9000 Multiple myeloma not having achieved remission: Secondary | ICD-10-CM | POA: Diagnosis not present

## 2019-11-16 DIAGNOSIS — D472 Monoclonal gammopathy: Secondary | ICD-10-CM

## 2019-11-16 NOTE — Progress Notes (Signed)
HEMATOLOGY/ONCOLOGY NOTE  Date of Service: 11/16/2019  Patient Care Team: Lauree Chandler, NP as PCP - General (Geriatric Medicine)   CHIEF COMPLAINTS:  Elevated serum globulin levels/Monoclonal paraproteinemia concerning for plasma cell dyscrasia   HISTORY OF PRESENTING ILLNESS:  Jasmine Fletcher is a wonderful 85 y.o. female who has been referred to [place] by Lauree Chandler, NP for evaluation and management of elevated serum globulin levels. The pt reports that she is doing well overall.  The pt reports that her appetite has decreased recently but that she eats a healthy diet. She notes mild fatigue. Denies new bone pain, back pain, hip pain, infection concerns, and any other new concerns.  She has been getting a Prolia shot every 6 months for the past 2 years. She has also been taking Aricept but notes that her memory is worsening. Her daughter is her health care POA, but the pt makes her own healthcare decisions at this time.  Most recent lab results (06/14/2019) of CBC is as follows: all values are WNL except for HGB at 11.4, MCH at 26.8. 06/21/2019 UPEP revealed protein/creat ratio at 209, total protein at 29, and two abnormal protein bands 06/14/2019 SPEP revealed total protein at 10.1, Alpha 2 at 1.0, gamma globulin at 4.1, abnormal protein band1 at 3.9  On review of systems, pt reports some fatigue, decreased appetite, and denies new bone pain, back pain, hip pain, infection concerns, and any other symptoms.   On PMHx the pt reports that she is taking BP medication  On Social Hx the pt reports that she quit smoking in 2004 and does not drink alcohol   INTERVAL HISTORY:  Jasmine Fletcher is a 85 y.o. female here for evaluation and management of plasma cell dyscrasia. We are joined today by her daughter. The patient's last visit with [place] was on 09/09/2019. The pt reports that she is doing well overall.  The pt reports that she is still having some discomfort in her back  from her last BM Bx. Pt has PT that comes to see her in her home twice per week. Her daughter notices that pt's energy level has been lowered since she has been unable to get to the senior center and stay more active. Her family members have taken over some of her care to help her remain safe in the pandemic. Pt is currently drinking a sufficient amount of water. Pt continues to take Ergocalciferol once per week and is not currently taking any calcium supplements. When it is a warm day she goes to sit on the porch to help her get out of the house. Pt had a good Thanksgiving, with a few close family members.   Lab results (11/09/19) of CBC w/diff and CMP is as follows: all values are WNL except for Creatinine at 1.02, Total Protein at 10.3, Albumin at 3.4, AST at 14, GFR Est Afr Am at 58. 11/09/2019 Calcium at 5.8 11/09/2019 Vitamin D 25-hydroxy at 87.54 11/09/2019 MMP is as follows: M Protein at 3.5  11/09/2019 K/L light chains is as follows: Kappa free light chains at 532.7, Lamda free light chains at 9.7, K/L light chain ratio at 54.92  On review of systems, pt reports fatigue, mild back discomfort, leg swelling and denies new bone pains, abdominal pain, bowel movement issues and any other symptoms.   MEDICAL HISTORY:  Past Medical History:  Diagnosis Date   Hyperlipidemia    Hypertension    Osteoporosis     SURGICAL  HISTORY: Past Surgical History:  Procedure Laterality Date   ABDOMINAL HYSTERECTOMY  1998    SOCIAL HISTORY: Social History   Socioeconomic History   Marital status: Single    Spouse name: Not on file   Number of children: Not on file   Years of education: Not on file   Highest education level: Not on file  Occupational History   Not on file  Social Needs   Financial resource strain: Not hard at all   Food insecurity    Worry: Never true    Inability: Never true   Transportation needs    Medical: No    Non-medical: No  Tobacco Use   Smoking  status: Former Smoker    Packs/day: 1.00    Quit date: 12/08/2002    Years since quitting: 16.9   Smokeless tobacco: Never Used   Tobacco comment: Quit t age 53   Substance and Sexual Activity   Alcohol use: No    Alcohol/week: 0.0 standard drinks   Drug use: No   Sexual activity: Not on file  Lifestyle   Physical activity    Days per week: 3 days    Minutes per session: 30 min   Stress: Not at all  Relationships   Social connections    Talks on phone: Three times a week    Gets together: Three times a week    Attends religious service: Never    Active member of club or organization: No    Attends meetings of clubs or organizations: Never    Relationship status: Never married   Intimate partner violence    Fear of current or ex partner: No    Emotionally abused: No    Physically abused: No    Forced sexual activity: No  Other Topics Concern   Not on file  Social History Narrative   Not on file    FAMILY HISTORY: Family History  Problem Relation Age of Onset   Cancer Father    Dementia Sister    Breast cancer Maternal Grandmother    Breast cancer Paternal Grandmother     ALLERGIES:  has No Known Allergies.  MEDICATIONS:  Current Outpatient Medications  Medication Sig Dispense Refill   aspirin 81 MG tablet Take 81 mg by mouth daily.     calcium-vitamin D (OSCAL 500/200 D-3) 500-200 MG-UNIT per tablet Take 1 tablet by mouth 2 (two) times daily. 60 tablet 3   denosumab (PROLIA) 60 MG/ML SOSY injection Inject 60 mg into the skin every 6 (six) months. 1 mL 0   donepezil (ARICEPT) 10 MG tablet Take 1 tablet (10 mg total) by mouth daily. 90 tablet 1   ergocalciferol (VITAMIN D2) 1.25 MG (50000 UT) capsule Take 1 capsule (50,000 Units total) by mouth once a week. 12 capsule 2   feeding supplement (BOOST HIGH PROTEIN) LIQD Take 1 Container by mouth daily.     hydrochlorothiazide (HYDRODIURIL) 25 MG tablet Take 1 tablet (25 mg total) by mouth daily. 90  tablet 1   lisinopril (PRINIVIL,ZESTRIL) 5 MG tablet Take 1 tablet (5 mg total) by mouth daily. 90 tablet 1   simvastatin (ZOCOR) 20 MG tablet TAKE 1 TABLET(20 MG) BY MOUTH DAILY 90 tablet 1   No current facility-administered medications for this visit.     REVIEW OF SYSTEMS:   A 10+ POINT REVIEW OF SYSTEMS WAS OBTAINED including neurology, dermatology, psychiatry, cardiac, respiratory, lymph, extremities, GI, GU, Musculoskeletal, constitutional, breasts, reproductive, HEENT.  All pertinent positives are noted  in the HPI.  All others are negative.    PHYSICAL EXAMINATION: Vitals:   11/16/19 1206  BP: (!) 162/74  Pulse: 77  Resp: 18  Temp: 97.8 F (36.6 C)  SpO2: 97%   Wt Readings from Last 3 Encounters:  11/16/19 151 lb 14.4 oz (68.9 kg)  10/17/19 151 lb 3.2 oz (68.6 kg)  08/12/19 150 lb 11.2 oz (68.4 kg)   Body mass index is 23.79 kg/m.    ECOG FS:2 - Symptomatic, <50% confined to bed   GENERAL:alert, in no acute distress and comfortable SKIN: no acute rashes, no significant lesions EYES: conjunctiva are pink and non-injected, sclera anicteric OROPHARYNX: MMM, no exudates, no oropharyngeal erythema or ulceration NECK: supple, no JVD LYMPH:  no palpable lymphadenopathy in the cervical, axillary or inguinal regions LUNGS: clear to auscultation b/l with normal respiratory effort HEART: regular rate & rhythm ABDOMEN:  normoactive bowel sounds , non tender, not distended. No palpable hepatosplenomegaly.  Extremity: trace edema PSYCH: alert & oriented x 3 with fluent speech NEURO: no focal motor/sensory deficits  LABORATORY DATA:  I have reviewed the data as listed  . CBC Latest Ref Rng & Units 11/09/2019 09/02/2019 08/05/2019  WBC 4.0 - 10.5 K/uL 4.6 4.5 4.7  Hemoglobin 12.0 - 15.0 g/dL 12.0 11.4(L) 11.6(L)  Hematocrit 36.0 - 46.0 % 38.8 37.4 38.0  Platelets 150 - 400 K/uL 235 217 209    . CMP Latest Ref Rng & Units 11/09/2019 07/28/2019 06/14/2019  Glucose 70 - 99  mg/dL 81 83 -  BUN 8 - 23 mg/dL 22 25(H) -  Creatinine 0.44 - 1.00 mg/dL 1.02(H) 1.15(H) -  Sodium 135 - 145 mmol/L 138 137 -  Potassium 3.5 - 5.1 mmol/L 4.0 3.9 -  Chloride 98 - 111 mmol/L 102 103 -  CO2 22 - 32 mmol/L 27 25 -  Calcium 8.9 - 10.3 mg/dL 10.1 9.7 -  Total Protein 6.5 - 8.1 g/dL 10.3(H) 11.1(H) 10.1(H)  Total Bilirubin 0.3 - 1.2 mg/dL 0.3 0.2(L) -  Alkaline Phos 38 - 126 U/L 58 64 -  AST 15 - 41 U/L 14(L) 17 -  ALT 0 - 44 U/L 9 9 -   Component     Latest Ref Rng & Units 06/21/2019 07/28/2019 08/01/2019  Total Protein, Urine-UPE24     Not Estab. mg/dL   8.2  Total Protein, Urine-Ur/day     30 - 150 mg/24 hr   180 (H)  ALBUMIN, U     %   19.7  ALPHA 1 URINE     %   1.5  Alpha 2, Urine     %   6.0  % BETA, Urine     %   21.4  GAMMA GLOBULIN URINE     %   51.4  Free Kappa Lt Chains,Ur     0.63 - 113.79 mg/L   383.74 (H)  Free Lambda Lt Chains,Ur     0.47 - 11.77 mg/L   0.68  Free Kappa/Lambda Ratio     1.03 - 31.76   564.32 (H)  Immunofixation Result, Urine        Comment  Total Volume        2,200  M-SPIKE %, Urine     Not Observed %   28.3 (H)  M-Spike, mg/24 hr     Not Observed mg/24 hr   51 (H)  NOTE:        Comment  IgG (Immunoglobin G), Serum  586 - 1,602 mg/dL  5,351 (H)   IgA     64 - 422 mg/dL  99   IgM (Immunoglobulin M), Srm     26 - 217 mg/dL  15 (L)   Total Protein ELP     6.0 - 8.5 g/dL  10.2 (H)   Albumin SerPl Elph-Mcnc     2.9 - 4.4 g/dL  4.1   Alpha 1     0.0 - 0.4 g/dL  0.3   Alpha2 Glob SerPl Elph-Mcnc     0.4 - 1.0 g/dL  0.8   B-Globulin SerPl Elph-Mcnc     0.7 - 1.3 g/dL  1.1   Gamma Glob SerPl Elph-Mcnc     0.4 - 1.8 g/dL  3.9 (H)   M Protein SerPl Elph-Mcnc     Not Observed g/dL  3.4 (H)   Globulin, Total     2.2 - 3.9 g/dL  6.1 (H)   Albumin/Glob SerPl     0.7 - 1.7  0.7   IFE 1       Comment   Please Note (HCV):       Comment   Creatinine, Urine     20 - 275 mg/dL 139    Protein/Creat Ratio     21 - 161  mg/g creat 209 (H)    Protein/Creatinine Ratio     0.021 - 0.16 mg/mg creat 0.209 (H)    Total Protein, Urine     5 - 24 mg/dL 29 (H)    Albumin     % 44    Alpha-1-Globulin, U     % 3    ALPHA-2-GLOBULIN, U     % 12    Beta Globulin, U     % 9    Gamma Globulin, U     % 33    Abnormal Protein Band     NONE DETEC mg/dL 7 (H)    Abnormal Protein Band     NONE DETEC mg/dL 1 (H)    Interpretation          Iron     41 - 142 ug/dL  39 (L)   TIBC     236 - 444 ug/dL  248   Saturation Ratios     21 - 57 %  16 (L)   UIBC     120 - 384 ug/dL  208   Kappa free light chain     3.3 - 19.4 mg/L  412.0 (H)   Lamda free light chains     5.7 - 26.3 mg/L  8.0   Kappa, lamda light chain ratio     0.26 - 1.65  51.50 (H)   Sed Rate     0 - 22 mm/hr  110 (H)   LDH     98 - 192 U/L  135   Ferritin     11 - 307 ng/mL  154   Vitamin B12     180 - 914 pg/mL  709     09/02/2019 Bone Marrow Biopsy ([id number])   08/05/2019 Bone Marrow Biopsy ([id number])        RADIOGRAPHIC STUDIES: I have personally reviewed the radiological images as listed and agreed with the findings in the report. No results found.  ASSESSMENT & PLAN:  REN GRASSE is a 85 y.o. female with:  #1 IgG Kappa Monoclonal paraproteinemia with M protein of 3.9 -highly concerning for MM  06/14/2019 SPEP revealed total protein at 10.1, alpha 2  at 1.0, gamma globulin at 4.1, Abnormal protein band1 at 3.9 UPEP +ve for Bence Jones protein  07/29/2019 PET scan with results revealing "Two right thyroid nodules are both highly hypermetabolic. A significant minority of hypermetabolic thyroid lesions represent thyroid malignancy. Further workup with thyroid ultrasound is recommended. A small left upper para-aortic lymph node is mildly hypermetabolic with maximum SUV 4.7, significance uncertain. No significant abnormal accentuated osseous activity or compelling focal findings of active multiple myeloma in the  skeleton. Hypodense lesions in the liver are photopenic and likely benign  Hyperdense small lesions of the left kidney upper pole are too small to characterize but statistically most likely to be complex cysts. If the patient has hematuria or if otherwise clinically warranted, renal protocol MRI with and without contrast could be utilized for further characterization. Mild mid to distal accentuated thoracic esophageal activity with maximum SUV of 4.8; mildly accentuated activity in the gastric cardia/gastroesophageal junction with maximum SUV 5.8. Both could be physiologic but rarely low-grade malignancy could have a similar appearance, upper endoscopy could be utilized for further workup if clinically warranted. Other imaging findings of potential clinical significance: Chronic ischemic microvascular white matter disease intracranially. Aortic Atherosclerosis (ICD10-I70.0). Mild cardiomegaly. Biapical pleuroparenchymal scarring and probable scarring in the superior segment left lower lobe. Calcified uterine fibroids. Thoracolumbar scoliosis. No clear bone lesions."  08/05/2019 bone marrow biopsy ([id number]) with results revealing "no monoclonal b cell population or abnormal T cell phenotype identified. Atypical lymphoplasmacytoid aggregates - not clear for myeloma  09/02/2019 bone marrow biopsy ([id number]) with results revealing small popoulation of monoclonal plasma cell. Does not appear to co-related with M protein levels and seem to be underrepresented.  #2. FDG avid thyroid nodules  #3.  Patient Active Problem List   Diagnosis Date Noted   Smoldering myeloma ([place]) 10/17/2019   Vitamin D deficiency 10/17/2019   Dementia without behavioral disturbance ([place]) 07/01/2017   Mixed hyperlipidemia 11/29/2013   Osteoporosis 03/15/2013   BRADYCARDIA 02/17/2009   HYPERTENSION, BENIGN ESSENTIAL 02/16/2009   ANEMIA, HYPOCHROMIC 07/26/2007   DISORDER, TOBACCO USE 07/26/2007   DEPRESSION  07/26/2007   CARDIAC CATHETERIZATION, LEFT, HX OF 07/26/2007    PLAN: -Discussed pt labwork, 11/09/19; blood counts are normal, ionized calcium is elevated, K/L light chains have increased, M Protein is steady, Vitamin D levels look good  -Discussed Active Myeloma vs. Smoldering Myeloma -Discussed again CRAB criteria: borderline elevated calcium, no overt renal insufficiency, no significant anemia, no overt bone lesions -Advised pt that due to her advanced age it may be hard to get a rpt BM Bx -Discussed the conservative approach (watching with labs) vs. active approach (rpt scans and BM Bx) -Pt prefers the conservative approach at this time -Recommended that the pt continue to drink at least 48-64 oz of water each day -Recommended pt take Ergocalciferol once every 2 weeks -Recommended pt to avoid taking Calcium supplements -Plan to see pt every 2 months with labs -Will see back in 2 months via phone, with labs one week prior  FOLLOW UP: Labs in 8 weeks Phone visit with Dr Irene Limbo 1 week after labs   The total time spent in the appt was 20 minutes and more than 50% was on counseling and direct patient cares.  All of the patient's questions were answered with apparent satisfaction. The patient knows to call the clinic with any problems, questions or concerns.   Sullivan Lone MD [place] AAHIVMS [facility] Hematology/Oncology Physician [place]  (Office):  [phone] (Work cell):  [phone] (Fax):           [phone]  11/16/2019 1:00 PM  I, Yevette Edwards, am acting as a Education administrator for Dr. Sullivan Lone.   .I have reviewed the above documentation for accuracy and completeness, and I agree with the above.  Brunetta Genera MD

## 2019-11-17 ENCOUNTER — Telehealth: Payer: Self-pay | Admitting: Hematology

## 2019-11-17 NOTE — Telephone Encounter (Signed)
Scheduled appt per 12/9 los.  Spoke with pt and she is aware of her appt dates and time.

## 2019-12-03 ENCOUNTER — Other Ambulatory Visit: Payer: Self-pay | Admitting: Nurse Practitioner

## 2019-12-03 DIAGNOSIS — I10 Essential (primary) hypertension: Secondary | ICD-10-CM

## 2019-12-05 NOTE — Telephone Encounter (Signed)
rx sent to pharmacy by e-script  

## 2019-12-07 ENCOUNTER — Other Ambulatory Visit: Payer: Self-pay | Admitting: *Deleted

## 2019-12-07 MED ORDER — DENOSUMAB 60 MG/ML ~~LOC~~ SOSY: 60.0000 mg | PREFILLED_SYRINGE | SUBCUTANEOUS | 0 refills | Status: DC

## 2019-12-07 NOTE — Telephone Encounter (Signed)
Prolia sent to Pharmacy per Lattie Haw for patient to pick up.

## 2019-12-11 ENCOUNTER — Other Ambulatory Visit: Payer: Self-pay | Admitting: Nurse Practitioner

## 2019-12-11 DIAGNOSIS — I10 Essential (primary) hypertension: Secondary | ICD-10-CM

## 2019-12-16 ENCOUNTER — Ambulatory Visit (INDEPENDENT_AMBULATORY_CARE_PROVIDER_SITE_OTHER): Payer: Medicare Other

## 2019-12-16 ENCOUNTER — Other Ambulatory Visit: Payer: Self-pay

## 2019-12-16 DIAGNOSIS — M810 Age-related osteoporosis without current pathological fracture: Secondary | ICD-10-CM | POA: Diagnosis not present

## 2019-12-16 MED ORDER — DENOSUMAB 60 MG/ML ~~LOC~~ SOSY
60.0000 mg | PREFILLED_SYRINGE | Freq: Once | SUBCUTANEOUS | Status: AC
  Administered 2019-12-16: 60 mg via SUBCUTANEOUS

## 2020-01-08 ENCOUNTER — Other Ambulatory Visit: Payer: Self-pay | Admitting: Nurse Practitioner

## 2020-01-08 DIAGNOSIS — F5102 Adjustment insomnia: Secondary | ICD-10-CM

## 2020-01-11 ENCOUNTER — Other Ambulatory Visit: Payer: Self-pay

## 2020-01-11 ENCOUNTER — Inpatient Hospital Stay: Payer: Medicare Other | Attending: Hematology

## 2020-01-11 DIAGNOSIS — E041 Nontoxic single thyroid nodule: Secondary | ICD-10-CM | POA: Insufficient documentation

## 2020-01-11 DIAGNOSIS — R771 Abnormality of globulin: Secondary | ICD-10-CM | POA: Insufficient documentation

## 2020-01-11 DIAGNOSIS — D472 Monoclonal gammopathy: Secondary | ICD-10-CM | POA: Insufficient documentation

## 2020-01-11 DIAGNOSIS — C9000 Multiple myeloma not having achieved remission: Secondary | ICD-10-CM

## 2020-01-11 LAB — CMP (CANCER CENTER ONLY)
ALT: 9 U/L (ref 0–44)
AST: 15 U/L (ref 15–41)
Albumin: 3.4 g/dL — ABNORMAL LOW (ref 3.5–5.0)
Alkaline Phosphatase: 58 U/L (ref 38–126)
Anion gap: 7 (ref 5–15)
BUN: 25 mg/dL — ABNORMAL HIGH (ref 8–23)
CO2: 29 mmol/L (ref 22–32)
Calcium: 9.5 mg/dL (ref 8.9–10.3)
Chloride: 104 mmol/L (ref 98–111)
Creatinine: 1.13 mg/dL — ABNORMAL HIGH (ref 0.44–1.00)
GFR, Est AFR Am: 51 mL/min — ABNORMAL LOW (ref >60)
GFR, Estimated: 44 mL/min — ABNORMAL LOW (ref >60)
Glucose, Bld: 83 mg/dL (ref 70–99)
Potassium: 3.8 mmol/L (ref 3.5–5.1)
Sodium: 140 mmol/L (ref 135–145)
Total Bilirubin: 0.2 mg/dL — ABNORMAL LOW (ref 0.3–1.2)
Total Protein: 10.9 g/dL — ABNORMAL HIGH (ref 6.5–8.1)

## 2020-01-11 LAB — CBC WITH DIFFERENTIAL/PLATELET
Abs Immature Granulocytes: 0.01 10*3/uL (ref 0.00–0.07)
Basophils Absolute: 0.1 10*3/uL (ref 0.0–0.1)
Basophils Relative: 1 %
Eosinophils Absolute: 0 10*3/uL (ref 0.0–0.5)
Eosinophils Relative: 1 %
HCT: 36.7 % (ref 36.0–46.0)
Hemoglobin: 11.4 g/dL — ABNORMAL LOW (ref 12.0–15.0)
Immature Granulocytes: 0 %
Lymphocytes Relative: 30 %
Lymphs Abs: 1.3 10*3/uL (ref 0.7–4.0)
MCH: 27.1 pg (ref 26.0–34.0)
MCHC: 31.1 g/dL (ref 30.0–36.0)
MCV: 87.2 fL (ref 80.0–100.0)
Monocytes Absolute: 0.5 10*3/uL (ref 0.1–1.0)
Monocytes Relative: 11 %
Neutro Abs: 2.5 10*3/uL (ref 1.7–7.7)
Neutrophils Relative %: 57 %
Platelets: 230 10*3/uL (ref 150–400)
RBC: 4.21 MIL/uL (ref 3.87–5.11)
RDW: 14.2 % (ref 11.5–15.5)
WBC: 4.5 10*3/uL (ref 4.0–10.5)
nRBC: 0 % (ref 0.0–0.2)

## 2020-01-16 ENCOUNTER — Other Ambulatory Visit: Payer: Self-pay | Admitting: *Deleted

## 2020-01-16 DIAGNOSIS — E782 Mixed hyperlipidemia: Secondary | ICD-10-CM

## 2020-01-16 LAB — MULTIPLE MYELOMA PANEL, SERUM
Albumin SerPl Elph-Mcnc: 3.9 g/dL (ref 2.9–4.4)
Albumin/Glob SerPl: 0.7 (ref 0.7–1.7)
Alpha 1: 0.3 g/dL (ref 0.0–0.4)
Alpha2 Glob SerPl Elph-Mcnc: 0.8 g/dL (ref 0.4–1.0)
B-Globulin SerPl Elph-Mcnc: 1.1 g/dL (ref 0.7–1.3)
Gamma Glob SerPl Elph-Mcnc: 4 g/dL — ABNORMAL HIGH (ref 0.4–1.8)
Globulin, Total: 6.1 g/dL — ABNORMAL HIGH (ref 2.2–3.9)
IgA: 93 mg/dL (ref 64–422)
IgG (Immunoglobin G), Serum: 5582 mg/dL — ABNORMAL HIGH (ref 586–1602)
IgM (Immunoglobulin M), Srm: 14 mg/dL — ABNORMAL LOW (ref 26–217)
M Protein SerPl Elph-Mcnc: 3.6 g/dL — ABNORMAL HIGH
Total Protein ELP: 10 g/dL — ABNORMAL HIGH (ref 6.0–8.5)

## 2020-01-16 MED ORDER — SIMVASTATIN 20 MG PO TABS: ORAL_TABLET | ORAL | 1 refills | Status: DC

## 2020-01-16 NOTE — Telephone Encounter (Signed)
[facility] 

## 2020-01-18 ENCOUNTER — Inpatient Hospital Stay (HOSPITAL_BASED_OUTPATIENT_CLINIC_OR_DEPARTMENT_OTHER): Payer: Medicare Other | Admitting: Hematology

## 2020-01-18 DIAGNOSIS — D472 Monoclonal gammopathy: Secondary | ICD-10-CM | POA: Diagnosis not present

## 2020-01-18 DIAGNOSIS — C9000 Multiple myeloma not having achieved remission: Secondary | ICD-10-CM

## 2020-01-18 NOTE — Progress Notes (Signed)
HEMATOLOGY/ONCOLOGY NOTE  Date of Service: 01/18/2020  Patient Care Team: Lauree Chandler, NP as PCP - General (Geriatric Medicine)   CHIEF COMPLAINTS:  Elevated serum globulin levels/Monoclonal paraproteinemia concerning for plasma cell dyscrasia   HISTORY OF PRESENTING ILLNESS:  Jasmine Fletcher is a wonderful 86 y.o. female who has been referred to [place] by Lauree Chandler, NP for evaluation and management of elevated serum globulin levels. The pt reports that she is doing well overall.  The pt reports that her appetite has decreased recently but that she eats a healthy diet. She notes mild fatigue. Denies new bone pain, back pain, hip pain, infection concerns, and any other new concerns.  She has been getting a Prolia shot every 6 months for the past 2 years. She has also been taking Aricept but notes that her memory is worsening. Her daughter is her health care POA, but the pt makes her own healthcare decisions at this time.  Most recent lab results (06/14/2019) of CBC is as follows: all values are WNL except for HGB at 11.4, MCH at 26.8. 06/21/2019 UPEP revealed protein/creat ratio at 209, total protein at 29, and two abnormal protein bands 06/14/2019 SPEP revealed total protein at 10.1, Alpha 2 at 1.0, gamma globulin at 4.1, abnormal protein band1 at 3.9  On review of systems, pt reports some fatigue, decreased appetite, and denies new bone pain, back pain, hip pain, infection concerns, and any other symptoms.   On PMHx the pt reports that she is taking BP medication  On Social Hx the pt reports that she quit smoking in 2004 and does not drink alcohol   INTERVAL HISTORY: I connected with  Prince Rome on 01/18/20 by telephone and verified that I am speaking with the correct person using two identifiers.   I discussed the limitations of evaluation and management by telemedicine. The patient expressed understanding and agreed to proceed.  Other persons participating  in the visit and their role in the encounter:       -Yevette Edwards, Medical Scribe  Patient's location: Home Provider's location: [place] at [employer] is a 86 y.o. female here for evaluation and management of plasma cell dyscrasia. The patient's last visit with [place] was on 11/16/2019. The pt reports that she is doing well overall.  The pt reports that she has been feeling well and eating well. She eats lots of fruits and vegetables and drinks a Boost every morning. She denies any new pain. Pt notes that sometimes she feels weakness and fatigue, and attributes this to having to be more sedentary due to the pandemic. Pt has received her first dose of the COVID19 vaccine and had no issues. She has the second dose scheduled for the end of February. Pt has an upcoming appointment with Sherrie Mustache - NP on 03/08.   Lab results (01/11/20) of CBC w/diff and CMP is as follows: all values are WNL except for Hgb at 11.4, BUN at 25, Creatinine at 1.13, Total Protein at 10.9, Albumin at 3.4, Total Bilirubin at 0.2, GFR Est Af Am at 51. 01/11/2020 MMP is as follows: IgG at 5582, IgM at 14, Total Protein at 10.0, Gamma Glob at 4.0, M Protein at 3.6, Total Globulin at 6.1.   On review of systems, pt reports occasional weakness and denies fevers, chills, new bone pain, worsening fatigue and any other symptoms.    MEDICAL HISTORY:  Past Medical History:  Diagnosis Date  . Hyperlipidemia   .  Hypertension   . Osteoporosis     SURGICAL HISTORY: Past Surgical History:  Procedure Laterality Date  . ABDOMINAL HYSTERECTOMY  1998    SOCIAL HISTORY: Social History   Socioeconomic History  . Marital status: Single    Spouse name: Not on file  . Number of children: Not on file  . Years of education: Not on file  . Highest education level: Not on file  Occupational History  . Not on file  Tobacco Use  . Smoking status: Former Smoker    Packs/day: 1.00    Quit date: 12/08/2002    Years  since quitting: 17.1  . Smokeless tobacco: Never Used  . Tobacco comment: Quit t age 45   Substance and Sexual Activity  . Alcohol use: No    Alcohol/week: 0.0 standard drinks  . Drug use: No  . Sexual activity: Not on file  Other Topics Concern  . Not on file  Social History Narrative  . Not on file   Social Determinants of Health   Financial Resource Strain:   . Difficulty of Paying Living Expenses: Not on file  Food Insecurity:   . Worried About Charity fundraiser in the Last Year: Not on file  . Ran Out of Food in the Last Year: Not on file  Transportation Needs:   . Lack of Transportation (Medical): Not on file  . Lack of Transportation (Non-Medical): Not on file  Physical Activity:   . Days of Exercise per Week: Not on file  . Minutes of Exercise per Session: Not on file  Stress:   . Feeling of Stress : Not on file  Social Connections:   . Frequency of Communication with Friends and Family: Not on file  . Frequency of Social Gatherings with Friends and Family: Not on file  . Attends Religious Services: Not on file  . Active Member of Clubs or Organizations: Not on file  . Attends Archivist Meetings: Not on file  . Marital Status: Not on file  Intimate Partner Violence:   . Fear of Current or Ex-Partner: Not on file  . Emotionally Abused: Not on file  . Physically Abused: Not on file  . Sexually Abused: Not on file    FAMILY HISTORY: Family History  Problem Relation Age of Onset  . Cancer Father   . Dementia Sister   . Breast cancer Maternal Grandmother   . Breast cancer Paternal Grandmother     ALLERGIES:  has No Known Allergies.  MEDICATIONS:  Current Outpatient Medications  Medication Sig Dispense Refill  . aspirin 81 MG tablet Take 81 mg by mouth daily.    . calcium-vitamin D (OSCAL 500/200 D-3) 500-200 MG-UNIT per tablet Take 1 tablet by mouth 2 (two) times daily. 60 tablet 3  . denosumab (PROLIA) 60 MG/ML SOSY injection Inject 60 mg  into the skin every 6 (six) months. 1 mL 0  . donepezil (ARICEPT) 10 MG tablet Take 1 tablet (10 mg total) by mouth daily. 90 tablet 1  . ergocalciferol (VITAMIN D2) 1.25 MG (50000 UT) capsule Take 1 capsule (50,000 Units total) by mouth once a week. 12 capsule 2  . feeding supplement (BOOST HIGH PROTEIN) LIQD Take 1 Container by mouth daily.    . hydrochlorothiazide (HYDRODIURIL) 25 MG tablet TAKE 1 TABLET(25 MG) BY MOUTH DAILY 90 tablet 1  . lisinopril (ZESTRIL) 5 MG tablet TAKE 1 TABLET(5 MG) BY MOUTH DAILY 90 tablet 1  . simvastatin (ZOCOR) 20 MG tablet Take  one tablet by mouth once daily 90 tablet 1   No current facility-administered medications for this visit.    REVIEW OF SYSTEMS:   A 10+ POINT REVIEW OF SYSTEMS WAS OBTAINED including neurology, dermatology, psychiatry, cardiac, respiratory, lymph, extremities, GI, GU, Musculoskeletal, constitutional, breasts, reproductive, HEENT.  All pertinent positives are noted in the HPI.  All others are negative.    PHYSICAL EXAMINATION: There were no vitals filed for this visit. Wt Readings from Last 3 Encounters:  11/16/19 151 lb 14.4 oz (68.9 kg)  10/17/19 151 lb 3.2 oz (68.6 kg)  08/12/19 150 lb 11.2 oz (68.4 kg)   There is no height or weight on file to calculate BMI.    ECOG FS:2 - Symptomatic, <50% confined to bed   Telehealth visit   LABORATORY DATA:  I have reviewed the data as listed  . CBC Latest Ref Rng & Units 01/11/2020 11/09/2019 09/02/2019  WBC 4.0 - 10.5 K/uL 4.5 4.6 4.5  Hemoglobin 12.0 - 15.0 g/dL 11.4(L) 12.0 11.4(L)  Hematocrit 36.0 - 46.0 % 36.7 38.8 37.4  Platelets 150 - 400 K/uL 230 235 217    . CMP Latest Ref Rng & Units 01/11/2020 11/09/2019 07/28/2019  Glucose 70 - 99 mg/dL 83 81 83  BUN 8 - 23 mg/dL 25(H) 22 25(H)  Creatinine 0.44 - 1.00 mg/dL 1.13(H) 1.02(H) 1.15(H)  Sodium 135 - 145 mmol/L 140 138 137  Potassium 3.5 - 5.1 mmol/L 3.8 4.0 3.9  Chloride 98 - 111 mmol/L 104 102 103  CO2 22 - 32 mmol/L 29  27 25   Calcium 8.9 - 10.3 mg/dL 9.5 10.1 9.7  Total Protein 6.5 - 8.1 g/dL 10.9(H) 10.3(H) 11.1(H)  Total Bilirubin 0.3 - 1.2 mg/dL 0.2(L) 0.3 0.2(L)  Alkaline Phos 38 - 126 U/L 58 58 64  AST 15 - 41 U/L 15 14(L) 17  ALT 0 - 44 U/L 9 9 9    Component     Latest Ref Rng & Units 06/21/2019 07/28/2019 08/01/2019  Total Protein, Urine-UPE24     Not Estab. mg/dL   8.2  Total Protein, Urine-Ur/day     30 - 150 mg/24 hr   180 (H)  ALBUMIN, U     %   19.7  ALPHA 1 URINE     %   1.5  Alpha 2, Urine     %   6.0  % BETA, Urine     %   21.4  GAMMA GLOBULIN URINE     %   51.4  Free Kappa Lt Chains,Ur     0.63 - 113.79 mg/L   383.74 (H)  Free Lambda Lt Chains,Ur     0.47 - 11.77 mg/L   0.68  Free Kappa/Lambda Ratio     1.03 - 31.76   564.32 (H)  Immunofixation Result, Urine        Comment  Total Volume        2,200  M-SPIKE %, Urine     Not Observed %   28.3 (H)  M-Spike, mg/24 hr     Not Observed mg/24 hr   51 (H)  NOTE:        Comment  IgG (Immunoglobin G), Serum     586 - 1,602 mg/dL  5,351 (H)   IgA     64 - 422 mg/dL  99   IgM (Immunoglobulin M), Srm     26 - 217 mg/dL  15 (L)   Total Protein ELP     6.0 -  8.5 g/dL  10.2 (H)   Albumin SerPl Elph-Mcnc     2.9 - 4.4 g/dL  4.1   Alpha 1     0.0 - 0.4 g/dL  0.3   Alpha2 Glob SerPl Elph-Mcnc     0.4 - 1.0 g/dL  0.8   B-Globulin SerPl Elph-Mcnc     0.7 - 1.3 g/dL  1.1   Gamma Glob SerPl Elph-Mcnc     0.4 - 1.8 g/dL  3.9 (H)   M Protein SerPl Elph-Mcnc     Not Observed g/dL  3.4 (H)   Globulin, Total     2.2 - 3.9 g/dL  6.1 (H)   Albumin/Glob SerPl     0.7 - 1.7  0.7   IFE 1       Comment   Please Note (HCV):       Comment   Creatinine, Urine     20 - 275 mg/dL 139    Protein/Creat Ratio     21 - 161 mg/g creat 209 (H)    Protein/Creatinine Ratio     0.021 - 0.16 mg/mg creat 0.209 (H)    Total Protein, Urine     5 - 24 mg/dL 29 (H)    Albumin     % 44    Alpha-1-Globulin, U     % 3    ALPHA-2-GLOBULIN, U      % 12    Beta Globulin, U     % 9    Gamma Globulin, U     % 33    Abnormal Protein Band     NONE DETEC mg/dL 7 (H)    Abnormal Protein Band     NONE DETEC mg/dL 1 (H)    Interpretation          Iron     41 - 142 ug/dL  39 (L)   TIBC     236 - 444 ug/dL  248   Saturation Ratios     21 - 57 %  16 (L)   UIBC     120 - 384 ug/dL  208   Kappa free light chain     3.3 - 19.4 mg/L  412.0 (H)   Lamda free light chains     5.7 - 26.3 mg/L  8.0   Kappa, lamda light chain ratio     0.26 - 1.65  51.50 (H)   Sed Rate     0 - 22 mm/hr  110 (H)   LDH     98 - 192 U/L  135   Ferritin     11 - 307 ng/mL  154   Vitamin B12     180 - 914 pg/mL  709     09/02/2019 Bone Marrow Biopsy ([id number])   08/05/2019 Bone Marrow Biopsy ([id number])        RADIOGRAPHIC STUDIES: I have personally reviewed the radiological images as listed and agreed with the findings in the report. No results found.  ASSESSMENT & PLAN:  Jasmine Fletcher is a 86 y.o. female with:  #1 IgG Kappa Monoclonal paraproteinemia with M protein of 3.9 -highly concerning for MM  06/14/2019 SPEP revealed total protein at 10.1, alpha 2 at 1.0, gamma globulin at 4.1, Abnormal protein band1 at 3.9 UPEP +ve for Bence Jones protein  08/08/2019 PET scan with results revealing "Two right thyroid nodules are both highly hypermetabolic. A significant minority of hypermetabolic thyroid lesions represent thyroid malignancy. Further workup with thyroid ultrasound is  recommended. A small left upper para-aortic lymph node is mildly hypermetabolic with maximum SUV 4.7, significance uncertain. No significant abnormal accentuated osseous activity or compelling focal findings of active multiple myeloma in the skeleton. Hypodense lesions in the liver are photopenic and likely benign Hyperdense small lesions of the left kidney upper pole are too small to characterize but statistically most likely to be complex cysts. If the patient  has hematuria or if otherwise clinically warranted, renal protocol MRI with and without contrast could be utilized for further characterization. Mild mid to distal accentuated thoracic esophageal activity with maximum SUV of 4.8; mildly accentuated activity in the gastric cardia/gastroesophageal junction with maximum SUV 5.8. Both could be physiologic but rarely low-grade malignancy could have a similar appearance, upper endoscopy could be utilized for further workup if clinically warranted. Other imaging findings of potential clinical significance: Chronic ischemic microvascular white matter disease intracranially. Aortic Atherosclerosis (ICD10-I70.0). Mild cardiomegaly. Biapical pleuroparenchymal scarring and probable scarring in the superior segment left lower lobe. Calcified uterine fibroids. Thoracolumbar scoliosis. No clear bone lesions."  08/05/2019 bone marrow biopsy ([id number]) with results revealing "no monoclonal b cell population or abnormal T cell phenotype identified. Atypical lymphoplasmacytoid aggregates - not clear for myeloma  09/02/2019 bone marrow biopsy ([id number]) with results revealing small popoulation of monoclonal plasma cell. Does not appear to co-related with M protein levels and seem to be underrepresented.  #2. FDG avid thyroid nodules  #3.  Patient Active Problem List   Diagnosis Date Noted  . Smoldering myeloma ([place]) 10/17/2019  . Vitamin D deficiency 10/17/2019  . Dementia without behavioral disturbance ([place]) 07/01/2017  . Mixed hyperlipidemia 11/29/2013  . Osteoporosis 03/15/2013  . BRADYCARDIA 02/17/2009  . HYPERTENSION, BENIGN ESSENTIAL 02/16/2009  . ANEMIA, HYPOCHROMIC 07/26/2007  . DISORDER, TOBACCO USE 07/26/2007  . DEPRESSION 07/26/2007  . CARDIAC CATHETERIZATION, LEFT, HX OF 07/26/2007    PLAN: -Discussed pt labwork, 01/11/20;  all values are WNL except for Hgb at 11.4, BUN at 25, Creatinine at 1.13, Total Protein at 10.9, Albumin at 3.4, Total  Bilirubin at 0.2, GFR Est Af Am at 51. -Discussed 01/11/2020 MMP shows M Protein at 3.6 g/dL  - continues to slowly increase  -Discussed CRAB criteria: no hypercalcemia, stable renal function, mild anemia, no overt bone lesions -Discussed getting a repeat BM Bx and scans vs watching with labs - pt prefers to watch at this time -Discussed the accentuated thoracic esophageal activity and increased activity of thyroid nodules visualized on 08/08/2019 scan -Advised pt that further w/u for thyroid nodules would include US Thyroid and Thyroid Bx to r/o Thyroid Cancer  -Pt is unsure if she would like additional w/u for her thyroid nodules at this time -Will defer to Sherrie Mustache - NP for further discussion of thyroid nodule w/u or monitoring and increased esophageal activity -Pt is of advanced age and w/u could be quite taxing and difficult -Will continue to monitor every 2-3 months with clinic visits and labs -Recommend that the pt continue to drink at least 48-64 oz of water each day -Recommend pt receive the second dose of COVID19 vaccine when scheduled -Will see back in 10 weeks via phone, with labs 1 week prior   FOLLOW UP: Labs in 9 weeks Phone visit with Dr Irene Limbo 1 week after labs   The total time spent in the appt was 20 minutes and more than 50% was on counseling and direct patient cares.  All of the patient's questions were answered with apparent satisfaction. The patient knows to  call the clinic with any problems, questions or concerns.   Sullivan Lone MD [place] AAHIVMS [facility] Hematology/Oncology Physician [facility]  (Office):       [phone] (Work cell):  [phone] (Fax):           [phone]  01/18/2020 11:33 AM  I, Yevette Edwards, am acting as a scribe for Dr. Sullivan Lone.   .I have reviewed the above documentation for accuracy and completeness, and I agree with the above. Brunetta Genera MD

## 2020-01-20 ENCOUNTER — Telehealth: Payer: Self-pay | Admitting: Hematology

## 2020-01-20 NOTE — Telephone Encounter (Signed)
Scheduled per 02/10 los, called patient and voicemail is not set up.

## 2020-02-13 ENCOUNTER — Other Ambulatory Visit: Payer: Self-pay

## 2020-02-13 ENCOUNTER — Encounter: Payer: Self-pay | Admitting: Nurse Practitioner

## 2020-02-13 ENCOUNTER — Ambulatory Visit (INDEPENDENT_AMBULATORY_CARE_PROVIDER_SITE_OTHER): Payer: Medicare Other | Admitting: Nurse Practitioner

## 2020-02-13 VITALS — BP 148/82 | HR 59 | Temp 96.6°F | Ht 67.0 in | Wt 153.6 lb

## 2020-02-13 DIAGNOSIS — F325 Major depressive disorder, single episode, in full remission: Secondary | ICD-10-CM

## 2020-02-13 DIAGNOSIS — M810 Age-related osteoporosis without current pathological fracture: Secondary | ICD-10-CM

## 2020-02-13 DIAGNOSIS — I10 Essential (primary) hypertension: Secondary | ICD-10-CM

## 2020-02-13 DIAGNOSIS — E042 Nontoxic multinodular goiter: Secondary | ICD-10-CM | POA: Diagnosis not present

## 2020-02-13 DIAGNOSIS — E559 Vitamin D deficiency, unspecified: Secondary | ICD-10-CM | POA: Diagnosis not present

## 2020-02-13 DIAGNOSIS — F0390 Unspecified dementia without behavioral disturbance: Secondary | ICD-10-CM

## 2020-02-13 DIAGNOSIS — D472 Monoclonal gammopathy: Secondary | ICD-10-CM

## 2020-02-13 DIAGNOSIS — C9000 Multiple myeloma not having achieved remission: Secondary | ICD-10-CM | POA: Diagnosis not present

## 2020-02-13 DIAGNOSIS — E782 Mixed hyperlipidemia: Secondary | ICD-10-CM

## 2020-02-13 NOTE — Patient Instructions (Addendum)
Think about endocrinology referral for biopsy and work up on thyroid nodules   4 weeks via virtual visit for advance care planning/MOST form completion  4 months for routine follow up- to make at the time of her next PROLIA injection

## 2020-02-13 NOTE — Progress Notes (Signed)
Careteam: Patient Care Team: Lauree Chandler, NP as PCP - General (Geriatric Medicine)  PLACE OF SERVICE:  [place] Directive information Does Patient Have a Medical Advance Directive?: No, Would patient like information on creating a medical advance directive?: Yes (MAU/Ambulatory/Procedural Areas - Information given)(Given at previous appt, will discuss with family next month)  No Known Allergies  Chief Complaint  Patient presents with  . Medical Management of Chronic Issues    4 month follow up  . Medication Management    Cancer doctor had patient stop Calcium. Calcium is recommended for patient to get Prolia injection. Need to discuss      HPI: Patient is a 86 y.o. female for routine follow up in office.  Thyroid nodules- per oncologist note she is not sure if she wants additional workup on this.  She had recent biopsy and bone marrow biopsy and does not want more test  Osteoporosis- on vit D, calcium stopped due to smoldering myeloma. Continues on prolia.   Depression- reports more depression recently due to being home so much, used to getting out. Got outside over the weekend and that helped her mood. She likes to work with flowers and excited for that coming up. No SI or HI.   Hyperlipidemia- continues on zocor 20 mg daily  Hypertension- has not taken medication this morning. Holds off when she comes to the doctor due to the HCTZ causing increase in urination.   Dementia- continues on aricept 10 mg daily   Review of Systems:  Review of Systems  Constitutional: Negative for chills, fever and weight loss.  HENT: Positive for hearing loss.   Respiratory: Negative for cough, sputum production and shortness of breath.   Cardiovascular: Negative for chest pain, palpitations and leg swelling.  Gastrointestinal: Negative for abdominal pain, constipation, diarrhea and heartburn.  Genitourinary: Negative for dysuria, frequency and urgency.   Musculoskeletal: Negative for back pain, falls, joint pain and myalgias.  Skin: Negative.   Neurological: Negative for dizziness and headaches.  Psychiatric/Behavioral: Positive for memory loss. Negative for depression. The patient does not have insomnia.     Past Medical History:  Diagnosis Date  . Hyperlipidemia   . Hypertension   . Osteoporosis    Past Surgical History:  Procedure Laterality Date  . ABDOMINAL HYSTERECTOMY  1998   Social History:   reports that she quit smoking about 17 years ago. She smoked 1.00 pack per day. She has never used smokeless tobacco. She reports that she does not drink alcohol or use drugs.  Family History  Problem Relation Age of Onset  . Cancer Father   . Dementia Sister   . Breast cancer Maternal Grandmother   . Breast cancer Paternal Grandmother     Medications: Patient's Medications  New Prescriptions   No medications on file  Previous Medications   ASPIRIN 81 MG TABLET    Take 81 mg by mouth daily.   DENOSUMAB (PROLIA) 60 MG/ML SOSY INJECTION    Inject 60 mg into the skin every 6 (six) months.   DONEPEZIL (ARICEPT) 10 MG TABLET    Take 1 tablet (10 mg total) by mouth daily.   ERGOCALCIFEROL (VITAMIN D2) 1.25 MG (50000 UT) CAPSULE    Take 1 capsule (50,000 Units total) by mouth once a week.   FEEDING SUPPLEMENT (BOOST HIGH PROTEIN) LIQD    Take 1 Container by mouth daily.   HYDROCHLOROTHIAZIDE (HYDRODIURIL) 25 MG TABLET    TAKE 1 TABLET(25 MG) BY MOUTH  DAILY   LISINOPRIL (ZESTRIL) 5 MG TABLET    TAKE 1 TABLET(5 MG) BY MOUTH DAILY   SIMVASTATIN (ZOCOR) 20 MG TABLET    Take one tablet by mouth once daily  Modified Medications   No medications on file  Discontinued Medications   CALCIUM-VITAMIN D (OSCAL 500/200 D-3) 500-200 MG-UNIT PER TABLET    Take 1 tablet by mouth 2 (two) times daily.    Physical Exam:  Vitals:   02/13/20 0904  BP: (!) 148/82  Pulse: (!) 59  Temp: (!) 96.6 F (35.9 C)  TempSrc: Temporal  SpO2: 98%   Weight: 153 lb 9.6 oz (69.7 kg)  Height: 5' 7"  (1.702 m)   Body mass index is 24.06 kg/m. Wt Readings from Last 3 Encounters:  02/13/20 153 lb 9.6 oz (69.7 kg)  11/16/19 151 lb 14.4 oz (68.9 kg)  10/17/19 151 lb 3.2 oz (68.6 kg)    Physical Exam Constitutional:      General: She is not in acute distress.    Appearance: She is well-developed. She is not diaphoretic.  HENT:     Head: Normocephalic and atraumatic.     Mouth/Throat:     Pharynx: No oropharyngeal exudate.  Eyes:     Conjunctiva/sclera: Conjunctivae normal.     Pupils: Pupils are equal, round, and reactive to light.  Cardiovascular:     Rate and Rhythm: Normal rate and regular rhythm.     Heart sounds: Normal heart sounds.  Pulmonary:     Effort: Pulmonary effort is normal.     Breath sounds: Normal breath sounds.  Abdominal:     General: Bowel sounds are normal.     Palpations: Abdomen is soft.  Musculoskeletal:        General: No tenderness.     Cervical back: Normal range of motion and neck supple.  Skin:    General: Skin is warm and dry.  Neurological:     Mental Status: She is alert and oriented to person, place, and time.    Labs reviewed: Basic Metabolic Panel: Recent Labs    06/14/19 0948 06/14/19 0948 07/28/19 1124 11/09/19 1125 01/11/20 1102  NA 138   < > 137 138 140  K 3.9   < > 3.9 4.0 3.8  CL 103   < > 103 102 104  CO2 28   < > 25 27 29   GLUCOSE 84   < > 83 81 83  BUN 34*   < > 25* 22 25*  CREATININE 1.08*  --  1.15* 1.02* 1.13*  CALCIUM 9.8   < > 9.7 10.1 9.5  TSH 5.27*  --   --   --   --    < > = values in this interval not displayed.   Liver Function Tests: Recent Labs    07/28/19 1124 11/09/19 1125 01/11/20 1102  AST 17 14* 15  ALT 9 9 9   ALKPHOS 64 58 58  BILITOT 0.2* 0.3 0.2*  PROT 11.1* 10.3* 10.9*  ALBUMIN 3.5 3.4* 3.4*   No results for input(s): LIPASE, AMYLASE in the last 8760 hours. No results for input(s): AMMONIA in the last 8760 hours. CBC: Recent Labs     09/02/19 0802 11/09/19 1125 01/11/20 1102  WBC 4.5 4.6 4.5  NEUTROABS 2.6 2.7 2.5  HGB 11.4* 12.0 11.4*  HCT 37.4 38.8 36.7  MCV 87.6 86.4 87.2  PLT 217 235 230   Lipid Panel: Recent Labs    06/14/19 0948  CHOL 149  HDL 43*  LDLCALC 85  TRIG 111  CHOLHDL 3.5   TSH: Recent Labs    06/14/19 0948  TSH 5.27*   A1C: No results found for: HGBA1C   Assessment/Plan 1. Multiple thyroid nodules -discussed results of thyroid nodule with pt from PET scan, she had declined further work up with oncologist at the time. Again today unsure if she wants further work-up. Advised she could consult with specialist (endocringologist) if she wanted. Daughter in the room during conversation per pts request. They will notify if they decide for referral.  - TSH  2. Depression, major, single episode, complete remission ([place]) -reports somewhat recurrence of depression but feels like she is managing with lifestyle modifications by getting outdoors when he can. Does not feel the need for medication or to talk to anyone else about it.   3. Smoldering myeloma ([place]) Stable, continues to follow up with oncologist routinely who is following labs closely.   4. Vitamin D deficiency -continues on vit D 50,000 units weekly  5. Osteoporosis, unspecified osteoporosis type, unspecified pathological fracture presence Off calcium due to myeloma. Continues on prolia with vit D supplemtn   6. Dementia without behavioral disturbance, unspecified dementia type ([facility]) Stable, continues on aricept. No acute changes in cognitive or functional status. Continues to live alone with the support of her family and doing well.   7. Mixed hyperlipidemia Continues on   8. HYPERTENSION, BENIGN ESSENTIAL -elevated today however did not take blood pressure medication prior to leaving home. Recommended her to take medication prior to coming to office so we can properly assess   Next appt: 4 weeks for MOST form/advance  directive and 4 month for routine follow up [place]. [place], [facility] [phone]

## 2020-02-14 LAB — TSH: TSH: 5.85 mIU/L — ABNORMAL HIGH (ref 0.40–4.50)

## 2020-03-12 ENCOUNTER — Encounter: Payer: Medicare Other | Admitting: Nurse Practitioner

## 2020-03-12 ENCOUNTER — Other Ambulatory Visit: Payer: Self-pay

## 2020-03-13 ENCOUNTER — Other Ambulatory Visit: Payer: Self-pay

## 2020-03-13 ENCOUNTER — Encounter: Payer: Medicare Other | Admitting: Nurse Practitioner

## 2020-03-13 NOTE — Progress Notes (Signed)
Error

## 2020-03-13 NOTE — Progress Notes (Signed)
This encounter was created in error - please disregard.

## 2020-03-19 ENCOUNTER — Other Ambulatory Visit: Payer: Self-pay

## 2020-03-19 ENCOUNTER — Encounter: Payer: Self-pay | Admitting: Nurse Practitioner

## 2020-03-19 ENCOUNTER — Ambulatory Visit (INDEPENDENT_AMBULATORY_CARE_PROVIDER_SITE_OTHER): Payer: Medicare Other | Admitting: Nurse Practitioner

## 2020-03-19 VITALS — BP 138/82 | HR 55 | Temp 96.8°F | Ht 67.0 in | Wt 151.0 lb

## 2020-03-19 DIAGNOSIS — F0390 Unspecified dementia without behavioral disturbance: Secondary | ICD-10-CM

## 2020-03-19 DIAGNOSIS — C9000 Multiple myeloma not having achieved remission: Secondary | ICD-10-CM

## 2020-03-19 DIAGNOSIS — D472 Monoclonal gammopathy: Secondary | ICD-10-CM

## 2020-03-19 DIAGNOSIS — Z7189 Other specified counseling: Secondary | ICD-10-CM | POA: Diagnosis not present

## 2020-03-19 MED ORDER — DONEPEZIL HCL 5 MG PO TABS: 5.0000 mg | ORAL_TABLET | Freq: Every day | ORAL | 0 refills | Status: DC

## 2020-03-19 NOTE — Progress Notes (Signed)
Careteam: Patient Care Team: Lauree Chandler, NP as PCP - General (Geriatric Medicine)  PLACE OF SERVICE:  [place] Directive information    No Known Allergies  Chief Complaint  Patient presents with  . Advanced Directive    Discuss MOST form. Here with daughter [name]    . Medication Management    Discuss getting medications on the same schedule with pharmacy pick-up   . Medication Management    Discuss Aricept, patient states she is taken every other day due to side effects   . Medication Management    Dr.Kale told patient to d/c multivitamin and patient thinks she should be back on it. Pateint feels sluggish at times      HPI: Patient is a 86 y.o. female for MOST form completion  Feeling more sluggish and tired since off MVI, Dr Irene Limbo told her to stop but she wants to be back on medication.   Dementia- forgetting more when she takes Aricept daily, does not like the feeling of an whole tablet but wants to preserve memory function.   Review of Systems:  Review of Systems  Constitutional: Positive for malaise/fatigue. Negative for chills and fever.  Psychiatric/Behavioral: Positive for memory loss.    Past Medical History:  Diagnosis Date  . Hyperlipidemia   . Hypertension   . Osteoporosis    Past Surgical History:  Procedure Laterality Date  . ABDOMINAL HYSTERECTOMY  1998   Social History:   reports that she quit smoking about 17 years ago. She smoked 1.00 pack per day. She has never used smokeless tobacco. She reports that she does not drink alcohol or use drugs.  Family History  Problem Relation Age of Onset  . Cancer Father   . Dementia Sister   . Breast cancer Maternal Grandmother   . Breast cancer Paternal Grandmother     Medications: Patient's Medications  New Prescriptions   No medications on file  Previous Medications   ASPIRIN 81 MG TABLET    Take 81 mg by mouth daily.   DENOSUMAB (PROLIA) 60 MG/ML SOSY INJECTION    Inject  60 mg into the skin every 6 (six) months.   DONEPEZIL (ARICEPT) 10 MG TABLET    Take 1 tablet (10 mg total) by mouth daily.   ERGOCALCIFEROL (VITAMIN D2) 1.25 MG (50000 UT) CAPSULE    Take 1 capsule (50,000 Units total) by mouth once a week.   FEEDING SUPPLEMENT (BOOST HIGH PROTEIN) LIQD    Take 1 Container by mouth daily.   HYDROCHLOROTHIAZIDE (HYDRODIURIL) 25 MG TABLET    TAKE 1 TABLET(25 MG) BY MOUTH DAILY   LISINOPRIL (ZESTRIL) 5 MG TABLET    TAKE 1 TABLET(5 MG) BY MOUTH DAILY   SIMVASTATIN (ZOCOR) 20 MG TABLET    Take one tablet by mouth once daily  Modified Medications   No medications on file  Discontinued Medications   No medications on file    Physical Exam:  Vitals:   03/19/20 1132  BP: 138/82  Pulse: (!) 55  Temp: (!) 96.8 F (36 C)  TempSrc: Temporal  SpO2: 98%  Weight: 151 lb (68.5 kg)  Height: 5\' 7"  (1.702 m)   Body mass index is 23.65 kg/m. Wt Readings from Last 3 Encounters:  03/19/20 151 lb (68.5 kg)  02/13/20 153 lb 9.6 oz (69.7 kg)  11/16/19 151 lb 14.4 oz (68.9 kg)    Physical Exam Constitutional:      Appearance: Normal appearance.  Skin:  General: Skin is warm and dry.  Neurological:     Mental Status: She is alert and oriented to person, place, and time. Mental status is at baseline.  Psychiatric:        Mood and Affect: Mood normal.        Behavior: Behavior normal.     Labs reviewed: Basic Metabolic Panel: Recent Labs    06/14/19 0948 06/14/19 0948 07/28/19 1124 11/09/19 1125 01/11/20 1102 02/13/20 0948  NA 138   < > 137 138 140  --   K 3.9   < > 3.9 4.0 3.8  --   CL 103   < > 103 102 104  --   CO2 28   < > 25 27 29   --   GLUCOSE 84   < > 83 81 83  --   BUN 34*   < > 25* 22 25*  --   CREATININE 1.08*  --  1.15* 1.02* 1.13*  --   CALCIUM 9.8   < > 9.7 10.1 9.5  --   TSH 5.27*  --   --   --   --  5.85*   < > = values in this interval not displayed.   Liver Function Tests: Recent Labs    07/28/19 1124 11/09/19 1125  01/11/20 1102  AST 17 14* 15  ALT 9 9 9   ALKPHOS 64 58 58  BILITOT 0.2* 0.3 0.2*  PROT 11.1* 10.3* 10.9*  ALBUMIN 3.5 3.4* 3.4*   No results for input(s): LIPASE, AMYLASE in the last 8760 hours. No results for input(s): AMMONIA in the last 8760 hours. CBC: Recent Labs    09/02/19 0802 11/09/19 1125 01/11/20 1102  WBC 4.5 4.6 4.5  NEUTROABS 2.6 2.7 2.5  HGB 11.4* 12.0 11.4*  HCT 37.4 38.8 36.7  MCV 87.6 86.4 87.2  PLT 217 235 230   Lipid Panel: Recent Labs    06/14/19 0948  CHOL 149  HDL 43*  LDLCALC 85  TRIG 111  CHOLHDL 3.5   TSH: Recent Labs    06/14/19 0948 02/13/20 0948  TSH 5.27* 5.85*   A1C: No results found for: HGBA1C   Assessment/Plan 1. Dementia without behavioral disturbance, unspecified dementia type ([place]) -will decrease aricept to 5 mg daily to see if she tolerates better without side effects - donepezil (ARICEPT) 5 MG tablet; Take 1 tablet (5 mg total) by mouth daily.  Dispense: 90 tablet; Refill: 0  2. Smoldering myeloma ([place]) Ongoing follow up by oncologist, encouraged to stop MVI at request of oncologist  3. Advanced care planning/counseling discussion -DNR -MOST form completed and copies of paper work given to patient and daughter  Next appt: 06/13/2020 as scheduled  Janett Billow K. McMullen, [facility] [phone]

## 2020-03-21 ENCOUNTER — Other Ambulatory Visit: Payer: Self-pay

## 2020-03-21 ENCOUNTER — Inpatient Hospital Stay: Payer: Medicare Other | Attending: Hematology

## 2020-03-21 DIAGNOSIS — D472 Monoclonal gammopathy: Secondary | ICD-10-CM | POA: Insufficient documentation

## 2020-03-21 DIAGNOSIS — R771 Abnormality of globulin: Secondary | ICD-10-CM | POA: Insufficient documentation

## 2020-03-21 DIAGNOSIS — C9000 Multiple myeloma not having achieved remission: Secondary | ICD-10-CM

## 2020-03-21 LAB — CMP (CANCER CENTER ONLY)
ALT: 10 U/L (ref 0–44)
AST: 16 U/L (ref 15–41)
Albumin: 3.3 g/dL — ABNORMAL LOW (ref 3.5–5.0)
Alkaline Phosphatase: 53 U/L (ref 38–126)
Anion gap: 7 (ref 5–15)
BUN: 21 mg/dL (ref 8–23)
CO2: 28 mmol/L (ref 22–32)
Calcium: 10.2 mg/dL (ref 8.9–10.3)
Chloride: 102 mmol/L (ref 98–111)
Creatinine: 1.07 mg/dL — ABNORMAL HIGH (ref 0.44–1.00)
GFR, Est AFR Am: 54 mL/min — ABNORMAL LOW (ref >60)
GFR, Estimated: 47 mL/min — ABNORMAL LOW (ref >60)
Glucose, Bld: 81 mg/dL (ref 70–99)
Potassium: 3.8 mmol/L (ref 3.5–5.1)
Sodium: 137 mmol/L (ref 135–145)
Total Bilirubin: 0.4 mg/dL (ref 0.3–1.2)
Total Protein: 11 g/dL — ABNORMAL HIGH (ref 6.5–8.1)

## 2020-03-21 LAB — CBC WITH DIFFERENTIAL/PLATELET
Abs Immature Granulocytes: 0.01 10*3/uL (ref 0.00–0.07)
Basophils Absolute: 0 10*3/uL (ref 0.0–0.1)
Basophils Relative: 1 %
Eosinophils Absolute: 0 10*3/uL (ref 0.0–0.5)
Eosinophils Relative: 1 %
HCT: 38.9 % (ref 36.0–46.0)
Hemoglobin: 11.8 g/dL — ABNORMAL LOW (ref 12.0–15.0)
Immature Granulocytes: 0 %
Lymphocytes Relative: 29 %
Lymphs Abs: 1.1 10*3/uL (ref 0.7–4.0)
MCH: 26.4 pg (ref 26.0–34.0)
MCHC: 30.3 g/dL (ref 30.0–36.0)
MCV: 87 fL (ref 80.0–100.0)
Monocytes Absolute: 0.4 10*3/uL (ref 0.1–1.0)
Monocytes Relative: 11 %
Neutro Abs: 2.2 10*3/uL (ref 1.7–7.7)
Neutrophils Relative %: 58 %
Platelets: 205 10*3/uL (ref 150–400)
RBC: 4.47 MIL/uL (ref 3.87–5.11)
RDW: 13.8 % (ref 11.5–15.5)
WBC: 3.8 10*3/uL — ABNORMAL LOW (ref 4.0–10.5)
nRBC: 0 % (ref 0.0–0.2)

## 2020-03-22 LAB — KAPPA/LAMBDA LIGHT CHAINS
Kappa free light chain: 438.7 mg/L — ABNORMAL HIGH (ref 3.3–19.4)
Kappa, lambda light chain ratio: 48.21 — ABNORMAL HIGH (ref 0.26–1.65)
Lambda free light chains: 9.1 mg/L (ref 5.7–26.3)

## 2020-03-27 LAB — MULTIPLE MYELOMA PANEL, SERUM
Albumin SerPl Elph-Mcnc: 4 g/dL (ref 2.9–4.4)
Albumin/Glob SerPl: 0.7 (ref 0.7–1.7)
Alpha 1: 0.2 g/dL (ref 0.0–0.4)
Alpha2 Glob SerPl Elph-Mcnc: 0.8 g/dL (ref 0.4–1.0)
B-Globulin SerPl Elph-Mcnc: 1.1 g/dL (ref 0.7–1.3)
Gamma Glob SerPl Elph-Mcnc: 4.1 g/dL — ABNORMAL HIGH (ref 0.4–1.8)
Globulin, Total: 6.2 g/dL — ABNORMAL HIGH (ref 2.2–3.9)
IgA: 94 mg/dL (ref 64–422)
IgG (Immunoglobin G), Serum: 4924 mg/dL — ABNORMAL HIGH (ref 586–1602)
IgM (Immunoglobulin M), Srm: 14 mg/dL — ABNORMAL LOW (ref 26–217)
M Protein SerPl Elph-Mcnc: 3.7 g/dL — ABNORMAL HIGH
Total Protein ELP: 10.2 g/dL — ABNORMAL HIGH (ref 6.0–8.5)

## 2020-03-27 NOTE — Progress Notes (Signed)
HEMATOLOGY/ONCOLOGY NOTE  Date of Service: 03/28/2020  Patient Care Team: Lauree Chandler, NP as PCP - General (Geriatric Medicine)   CHIEF COMPLAINTS:  F/u for smoldering myeloma   HISTORY OF PRESENTING ILLNESS:  Jasmine Fletcher is a wonderful 86 y.o. female who has been referred to [place] by Lauree Chandler, NP for evaluation and management of elevated serum globulin levels. The pt reports that she is doing well overall.  The pt reports that her appetite has decreased recently but that she eats a healthy diet. She notes mild fatigue. Denies new bone pain, back pain, hip pain, infection concerns, and any other new concerns.  She has been getting a Prolia shot every 6 months for the past 2 years. She has also been taking Aricept but notes that her memory is worsening. Her daughter is her health care POA, but the pt makes her own healthcare decisions at this time.  Most recent lab results (06/14/2019) of CBC is as follows: all values are WNL except for HGB at 11.4, MCH at 26.8. 06/21/2019 UPEP revealed protein/creat ratio at 209, total protein at 29, and two abnormal protein bands 06/14/2019 SPEP revealed total protein at 10.1, Alpha 2 at 1.0, gamma globulin at 4.1, abnormal protein band1 at 3.9  On review of systems, pt reports some fatigue, decreased appetite, and denies new bone pain, back pain, hip pain, infection concerns, and any other symptoms.   On PMHx the pt reports that she is taking BP medication  On Social Hx the pt reports that she quit smoking in 2004 and does not drink alcohol   INTERVAL HISTORY:  Jasmine Fletcher is a 86 y.o. female here for evaluation and management of plasma cell dyscrasia. We are joined today by her daughter. The patient's last visit with [place] was on 01/18/2020. The pt reports that she is doing well overall.  The pt reports that she has noticed an increase in fatigue and weakness in the last 1.5 months. Pt can complete most of her normal  activities, but takes much longer to do them and they take more effort. Her daughter notes that pt is used to going to the senior center daily and participating in activities, but has not been able to do so since the beginning of the pandemic.   Pt has had both doses of the COVID19 vaccine and tolerated them well. She has been eating well in the interim.   Pt has been receiving Prolia injections every 6 months and is taking Vitamin D and Vitamin C supplements. Sherrie Mustache - NP is currently managing her Osteoporosis.   Lab results (03/21/20) of CBC w/diff and CMP is as follows: all values are WNL except for WBC at 3.8K, Hgb at 11.8, Creatinine at 1.07, Total Protein at 11.0, Albumin at 3.3, GFR Est Af Am at 54. 03/21/2020 K/L light chains is as follows: Kappa free light chain at 438.7, Lamda free light chains at 9.1, K/L light chain ratio at 48.21 03/21/2020 MMP is as follows: all values are WNL except for IgG at 4924, IgM at 14, Total Protein at 10.2, Gamma Glob at 4.1, M Protein at 3.7, Total Globulin at 6.2.  On review of systems, pt reports fatigue, weakness and denies low appetite, new bone pain, constipation, diarrhea, dysuria, abdominal pain, back pain and any other symptoms.    MEDICAL HISTORY:  Past Medical History:  Diagnosis Date  . Hyperlipidemia   . Hypertension   . Osteoporosis  SURGICAL HISTORY: Past Surgical History:  Procedure Laterality Date  . ABDOMINAL HYSTERECTOMY  1998    SOCIAL HISTORY: Social History   Socioeconomic History  . Marital status: Single    Spouse name: Not on file  . Number of children: Not on file  . Years of education: Not on file  . Highest education level: Not on file  Occupational History  . Not on file  Tobacco Use  . Smoking status: Former Smoker    Packs/day: 1.00    Quit date: 12/08/2002    Years since quitting: 17.3  . Smokeless tobacco: Never Used  . Tobacco comment: Quit t age 89   Substance and Sexual Activity  .  Alcohol use: No    Alcohol/week: 0.0 standard drinks  . Drug use: No  . Sexual activity: Not on file  Other Topics Concern  . Not on file  Social History Narrative  . Not on file   Social Determinants of Health   Financial Resource Strain:   . Difficulty of Paying Living Expenses:   Food Insecurity:   . Worried About Charity fundraiser in the Last Year:   . Arboriculturist in the Last Year:   Transportation Needs:   . Film/video editor (Medical):   Marland Kitchen Lack of Transportation (Non-Medical):   Physical Activity:   . Days of Exercise per Week:   . Minutes of Exercise per Session:   Stress:   . Feeling of Stress :   Social Connections:   . Frequency of Communication with Friends and Family:   . Frequency of Social Gatherings with Friends and Family:   . Attends Religious Services:   . Active Member of Clubs or Organizations:   . Attends Archivist Meetings:   Marland Kitchen Marital Status:   Intimate Partner Violence:   . Fear of Current or Ex-Partner:   . Emotionally Abused:   Marland Kitchen Physically Abused:   . Sexually Abused:     FAMILY HISTORY: Family History  Problem Relation Age of Onset  . Cancer Father   . Dementia Sister   . Breast cancer Maternal Grandmother   . Breast cancer Paternal Grandmother     ALLERGIES:  has No Known Allergies.  MEDICATIONS:  Current Outpatient Medications  Medication Sig Dispense Refill  . aspirin 81 MG tablet Take 81 mg by mouth daily.    Marland Kitchen denosumab (PROLIA) 60 MG/ML SOSY injection Inject 60 mg into the skin every 6 (six) months. 1 mL 0  . donepezil (ARICEPT) 5 MG tablet Take 1 tablet (5 mg total) by mouth daily. 90 tablet 0  . ergocalciferol (VITAMIN D2) 1.25 MG (50000 UT) capsule Take 1 capsule (50,000 Units total) by mouth once a week. 12 capsule 2  . feeding supplement (BOOST HIGH PROTEIN) LIQD Take 1 Container by mouth daily.    . hydrochlorothiazide (HYDRODIURIL) 25 MG tablet TAKE 1 TABLET(25 MG) BY MOUTH DAILY 90 tablet 1  .  lisinopril (ZESTRIL) 5 MG tablet TAKE 1 TABLET(5 MG) BY MOUTH DAILY 90 tablet 1  . simvastatin (ZOCOR) 20 MG tablet Take one tablet by mouth once daily 90 tablet 1   No current facility-administered medications for this visit.    REVIEW OF SYSTEMS:   A 10+ POINT REVIEW OF SYSTEMS WAS OBTAINED including neurology, dermatology, psychiatry, cardiac, respiratory, lymph, extremities, GI, GU, Musculoskeletal, constitutional, breasts, reproductive, HEENT.  All pertinent positives are noted in the HPI.  All others are negative.   PHYSICAL EXAMINATION: Vitals:  03/28/20 0933  BP: (!) 155/78  Pulse: 72  Resp: 18  Temp: 98.7 F (37.1 C)  SpO2: 98%   Wt Readings from Last 3 Encounters:  03/28/20 150 lb 1.6 oz (68.1 kg)  03/19/20 151 lb (68.5 kg)  02/13/20 153 lb 9.6 oz (69.7 kg)   Body mass index is 23.51 kg/m.    ECOG FS:2 - Symptomatic, <50% confined to bed   Exam was given in a chair   GENERAL:alert, in no acute distress and comfortable SKIN: no acute rashes, no significant lesions EYES: conjunctiva are pink and non-injected, sclera anicteric OROPHARYNX: MMM, no exudates, no oropharyngeal erythema or ulceration NECK: supple, no JVD LYMPH:  no palpable lymphadenopathy in the cervical, axillary or inguinal regions LUNGS: clear to auscultation b/l with normal respiratory effort HEART: regular rate & rhythm ABDOMEN:  normoactive bowel sounds , non tender, not distended. No palpable hepatosplenomegaly.  Extremity: no pedal edema PSYCH: alert & oriented x 3 with fluent speech NEURO: no focal motor/sensory deficits  LABORATORY DATA:  I have reviewed the data as listed  . CBC Latest Ref Rng & Units 03/21/2020 01/11/2020 11/09/2019  WBC 4.0 - 10.5 K/uL 3.8(L) 4.5 4.6  Hemoglobin 12.0 - 15.0 g/dL 11.8(L) 11.4(L) 12.0  Hematocrit 36.0 - 46.0 % 38.9 36.7 38.8  Platelets 150 - 400 K/uL 205 230 235    . CMP Latest Ref Rng & Units 03/21/2020 01/11/2020 11/09/2019  Glucose 70 - 99 mg/dL 81  83 81  BUN 8 - 23 mg/dL 21 25(H) 22  Creatinine 0.44 - 1.00 mg/dL 1.07(H) 1.13(H) 1.02(H)  Sodium 135 - 145 mmol/L 137 140 138  Potassium 3.5 - 5.1 mmol/L 3.8 3.8 4.0  Chloride 98 - 111 mmol/L 102 104 102  CO2 22 - 32 mmol/L _0 Calcium 8.9 - 10.3 mg/dL 10.2 9.5 10.1  Total Protein 6.5 - 8.1 g/dL 11.0(H) 10.9(H) 10.3(H)  Total Bilirubin 0.3 - 1.2 mg/dL 0.4 0.2(L) 0.3  Alkaline Phos 38 - 126 U/L 53 58 58  AST 15 - 41 U/L 16 15 14(L)  ALT 0 - 44 U/L _1 Component     Latest Ref Rng & Units 06/21/2019 07/28/2019 08/01/2019  Total Protein, Urine-UPE24     Not Estab. mg/dL   8.2  Total Protein, Urine-Ur/day     30 - 150 mg/24 hr   180 (H)  ALBUMIN, U     %   19.7  ALPHA 1 URINE     %   1.5  Alpha 2, Urine     %   6.0  % BETA, Urine     %   21.4  GAMMA GLOBULIN URINE     %   51.4  Free Kappa Lt Chains,Ur     0.63 - 113.79 mg/L   383.74 (H)  Free Lambda Lt Chains,Ur     0.47 - 11.77 mg/L   0.68  Free Kappa/Lambda Ratio     1.03 - 31.76   564.32 (H)  Immunofixation Result, Urine        Comment  Total Volume        2,200  M-SPIKE %, Urine     Not Observed %   28.3 (H)  M-Spike, mg/24 hr     Not Observed mg/24 hr   51 (H)  NOTE:        Comment  IgG (Immunoglobin G), Serum     586 - 1,602 mg/dL  5,351 (H)  IgA     64 - 422 mg/dL  99   IgM (Immunoglobulin M), Srm     26 - 217 mg/dL  15 (L)   Total Protein ELP     6.0 - 8.5 g/dL  10.2 (H)   Albumin SerPl Elph-Mcnc     2.9 - 4.4 g/dL  4.1   Alpha 1     0.0 - 0.4 g/dL  0.3   Alpha2 Glob SerPl Elph-Mcnc     0.4 - 1.0 g/dL  0.8   B-Globulin SerPl Elph-Mcnc     0.7 - 1.3 g/dL  1.1   Gamma Glob SerPl Elph-Mcnc     0.4 - 1.8 g/dL  3.9 (H)   M Protein SerPl Elph-Mcnc     Not Observed g/dL  3.4 (H)   Globulin, Total     2.2 - 3.9 g/dL  6.1 (H)   Albumin/Glob SerPl     0.7 - 1.7  0.7   IFE 1       Comment   Please Note (HCV):       Comment   Creatinine, Urine     20 - 275 mg/dL 139    Protein/Creat  Ratio     21 - 161 mg/g creat 209 (H)    Protein/Creatinine Ratio     0.021 - 0.16 mg/mg creat 0.209 (H)    Total Protein, Urine     5 - 24 mg/dL 29 (H)    Albumin     % 44    Alpha-1-Globulin, U     % 3    ALPHA-2-GLOBULIN, U     % 12    Beta Globulin, U     % 9    Gamma Globulin, U     % 33    Abnormal Protein Band     NONE DETEC mg/dL 7 (H)    Abnormal Protein Band     NONE DETEC mg/dL 1 (H)    Interpretation          Iron     41 - 142 ug/dL  39 (L)   TIBC     236 - 444 ug/dL  248   Saturation Ratios     21 - 57 %  16 (L)   UIBC     120 - 384 ug/dL  208   Kappa free light chain     3.3 - 19.4 mg/L  412.0 (H)   Lamda free light chains     5.7 - 26.3 mg/L  8.0   Kappa, lamda light chain ratio     0.26 - 1.65  51.50 (H)   Sed Rate     0 - 22 mm/hr  110 (H)   LDH     98 - 192 U/L  135   Ferritin     11 - 307 ng/mL  154   Vitamin B12     180 - 914 pg/mL  709     09/02/2019 Bone Marrow Biopsy ([id number])   08/05/2019 Bone Marrow Biopsy ([id number])        RADIOGRAPHIC STUDIES: I have personally reviewed the radiological images as listed and agreed with the findings in the report. No results found.  ASSESSMENT & PLAN:  Jasmine Fletcher is a 86 y.o. female with:  #1 IgG Kappa Monoclonal paraproteinemia with M protein of 3.9 -highly concerning for MM  06/14/2019 SPEP revealed total protein at 10.1, alpha 2 at 1.0, gamma globulin at 4.1, Abnormal protein band1  at 3.9 UPEP +ve for Bence Jones protein  08/08/2019 PET scan with results revealing "Two right thyroid nodules are both highly hypermetabolic. A significant minority of hypermetabolic thyroid lesions represent thyroid malignancy. Further workup with thyroid ultrasound is recommended. A small left upper para-aortic lymph node is mildly hypermetabolic with maximum SUV 4.7, significance uncertain. No significant abnormal accentuated osseous activity or compelling focal findings of active multiple  myeloma in the skeleton. Hypodense lesions in the liver are photopenic and likely benign Hyperdense small lesions of the left kidney upper pole are too small to characterize but statistically most likely to be complex cysts. If the patient has hematuria or if otherwise clinically warranted, renal protocol MRI with and without contrast could be utilized for further characterization. Mild mid to distal accentuated thoracic esophageal activity with maximum SUV of 4.8; mildly accentuated activity in the gastric cardia/gastroesophageal junction with maximum SUV 5.8. Both could be physiologic but rarely low-grade malignancy could have a similar appearance, upper endoscopy could be utilized for further workup if clinically warranted. Other imaging findings of potential clinical significance: Chronic ischemic microvascular white matter disease intracranially. Aortic Atherosclerosis (ICD10-I70.0). Mild cardiomegaly. Biapical pleuroparenchymal scarring and probable scarring in the superior segment left lower lobe. Calcified uterine fibroids. Thoracolumbar scoliosis. No clear bone lesions."  08/05/2019 bone marrow biopsy ([id number]) with results revealing "no monoclonal b cell population or abnormal T cell phenotype identified. Atypical lymphoplasmacytoid aggregates - not clear for myeloma  09/02/2019 bone marrow biopsy ([id number]) with results revealing small popoulation of monoclonal plasma cell. Does not appear to co-related with M protein levels and seem to be underrepresented.  #2. FDG avid thyroid nodules  #3.  Patient Active Problem List   Diagnosis Date Noted  . Depression, major, single episode, complete remission ([place]) 02/13/2020  . Smoldering myeloma ([place]) 10/17/2019  . Vitamin D deficiency 10/17/2019  . Dementia without behavioral disturbance ([place]) 07/01/2017  . Mixed hyperlipidemia 11/29/2013  . Osteoporosis 03/15/2013  . BRADYCARDIA 02/17/2009  . HYPERTENSION, BENIGN ESSENTIAL 02/16/2009  .  ANEMIA, HYPOCHROMIC 07/26/2007  . DISORDER, TOBACCO USE 07/26/2007  . DEPRESSION 07/26/2007  . CARDIAC CATHETERIZATION, LEFT, HX OF 07/26/2007    PLAN: -Discussed pt labwork, 03/21/20; blood counts and chemistries are steady, K/L light chains are stable, M Protien is stable at 3.7 g/dL  -Discussed CRAB criteria: no hypercalcemia, stable renal function, no anemia, no bone lesions identified -Advised pt that due to lack of CRAB criteria we would still consider this Smoldering Myeloma -Advised pt that her symptoms of fatigue may be from Multiple Myeloma, but no clear causal relationship -Pt is of advanced age and w/u could be quite taxing and difficult  -Advised pt that treatment might be more burdensome than symptoms at this time -Would recommend watchful observance every 2-3 months with labs and clinic visits. This is in keeping with pt's wishes. -Recommended that the pt continue to eat well, drink at least 48 oz of water each day, and walk 20-30 minutes each day.  -Recommend pt continue to f/u with Sherrie Mustache - NP for Osteoporosis management.  -Will see back in 12 weeks, with labs 1 week prior   FOLLOW UP: Labs in 11 weeks RTC with Dr Irene Limbo in 12 weeks   The total time spent in the appt was 20 minutes and more than 50% was on counseling and direct patient cares.  All of the patient's questions were answered with apparent satisfaction. The patient knows to call the clinic with any problems, questions or concerns.  Sullivan Lone MD [place] AAHIVMS [facility] Hematology/Oncology Physician [facility]  (Office):       [phone] (Work cell):  [phone] (Fax):           [phone]  03/28/2020 9:59 AM  I, Yevette Edwards, am acting as a scribe for Dr. Sullivan Lone.   .I have reviewed the above documentation for accuracy and completeness, and I agree with the above. Brunetta Genera MD

## 2020-03-28 ENCOUNTER — Other Ambulatory Visit: Payer: Self-pay

## 2020-03-28 ENCOUNTER — Inpatient Hospital Stay (HOSPITAL_BASED_OUTPATIENT_CLINIC_OR_DEPARTMENT_OTHER): Payer: Medicare Other | Admitting: Hematology

## 2020-03-28 VITALS — BP 155/78 | HR 72 | Temp 98.7°F | Resp 18 | Ht 67.0 in | Wt 150.1 lb

## 2020-03-28 DIAGNOSIS — D472 Monoclonal gammopathy: Secondary | ICD-10-CM | POA: Diagnosis not present

## 2020-03-28 DIAGNOSIS — R771 Abnormality of globulin: Secondary | ICD-10-CM | POA: Diagnosis not present

## 2020-03-28 DIAGNOSIS — C9000 Multiple myeloma not having achieved remission: Secondary | ICD-10-CM | POA: Diagnosis not present

## 2020-03-29 ENCOUNTER — Telehealth: Payer: Self-pay | Admitting: Hematology

## 2020-03-29 NOTE — Telephone Encounter (Signed)
Scheduled per 04/21 los, patient has been called and notified. 

## 2020-05-02 IMAGING — PT NM PET TUM IMG INITIAL (PI) WHOLE BODY
8 series · 25 of 25 positions shown · non-contrast
Comparison: Chest radiograph from 03/05/2017

CLINICAL DATA: Initial treatment strategy for multiple myeloma.

EXAM:
NUCLEAR MEDICINE PET WHOLE BODY
TECHNIQUE: 7.5 mCi F-18 FDG was injected intravenously. Full-ring PET imaging
was performed from the skull base to thigh after the radiotracer. CT
data was obtained and used for attenuation correction and anatomic
localization.
Fasting blood glucose: 96 mg/dl

[Series 3: pet wb ac · axial · 5.0mm · 4.07mm/px · z∈[-1428,+356]mm · 5 of 447 slices shown]
[im 1/447]
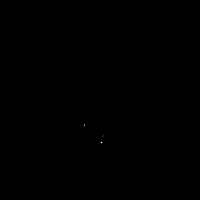
[im 112/447]
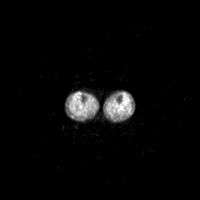
[im 224/447]
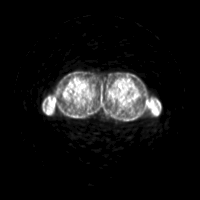
[im 335/447]
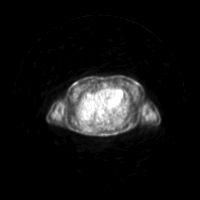
[im 447/447]
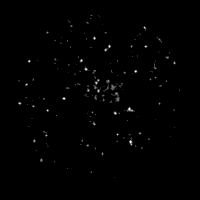

[Series 4: ct wb 5.0 b31f · axial · 5.0mm · 0.98mm/px · z∈[-1428,+356]mm · 5 of 447 slices shown]
[im 1/447]
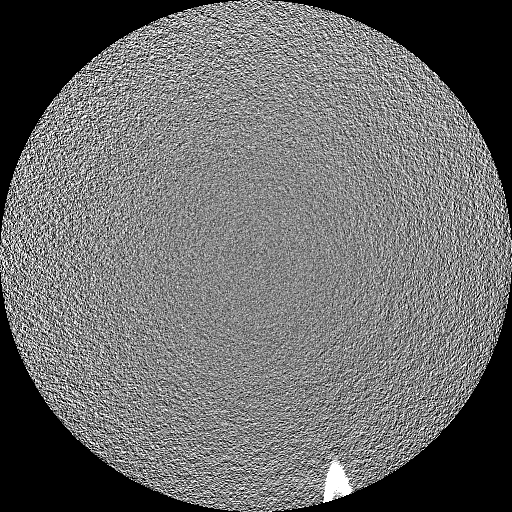
[im 112/447  soft-tissue]
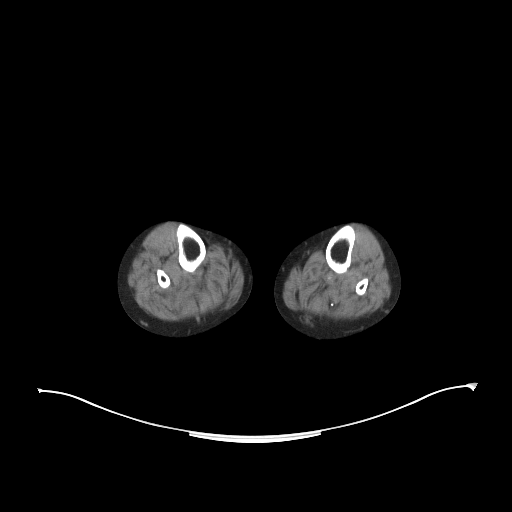
[im 224/447  soft-tissue]
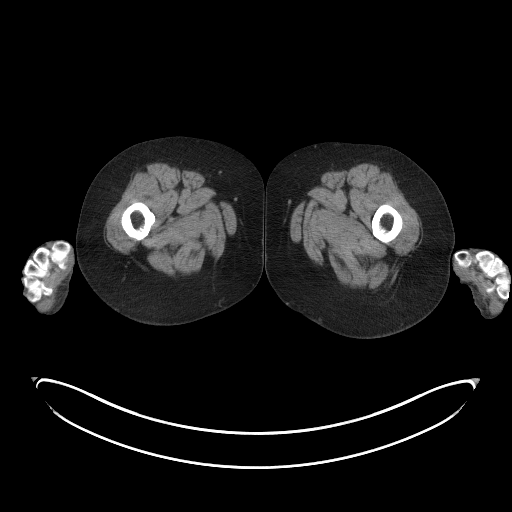
[im 335/447  soft-tissue]
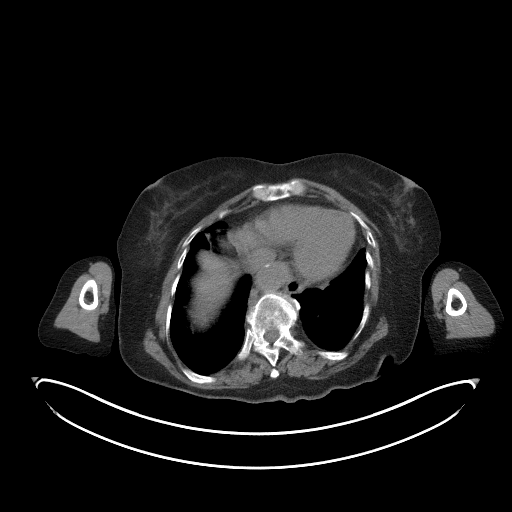
[im 447/447  full-range]
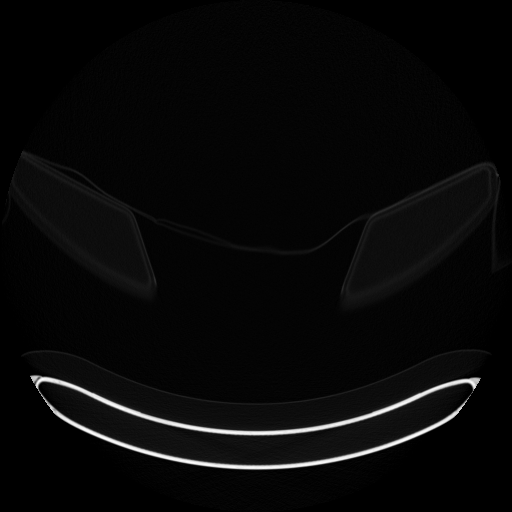

[Series 5: pet wb nac · axial · 5.0mm · 4.07mm/px · z∈[-1424,+356]mm · 6 of 446 slices shown]
[im 1/446]
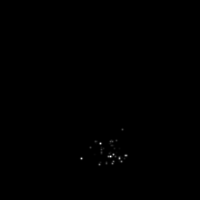
[im 90/446]
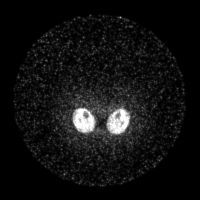
[im 179/446]
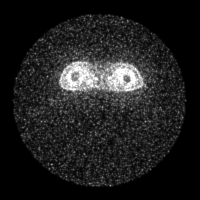
[im 268/446]
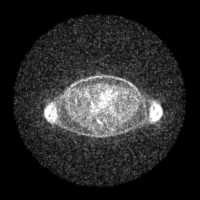
[im 357/446]
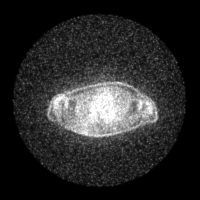
[im 446/446]
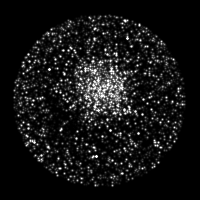

[Series 8: ct wb 5.0 (id) lung_bone · axial · 5.0mm · 0.60mm/px · 1 of 74 slices shown]
[im 1/74  lung]
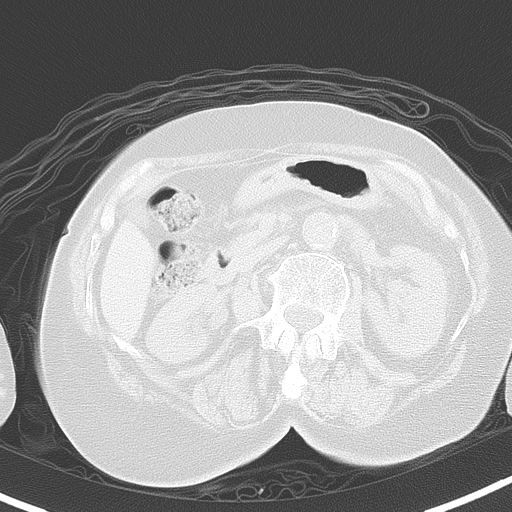

[Series 603: range-ct wb 5.0 (id)<alpha range> · 1 of 73 slices shown (1 of 2)]
[im 1/73]
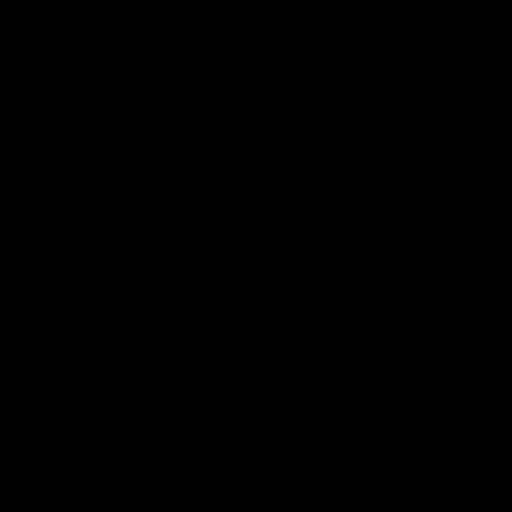

[Series 604: mip range 1 · coronal · 3.69mm/px · 1 of 32 slices shown]
[im 1/32]
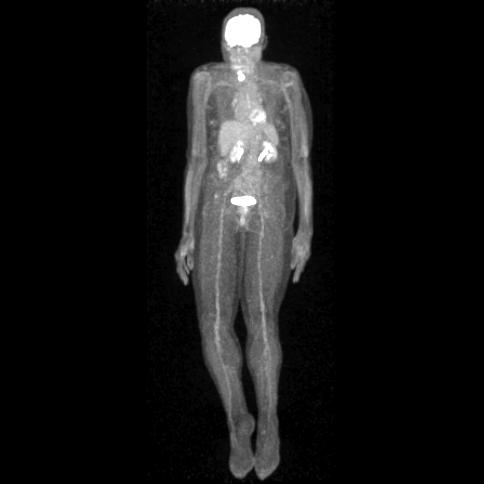

[Series 605: range-ct wb 5.0 (id)<alpha range> · 5 of 396 slices shown (2 of 2)]
[im 1/396]
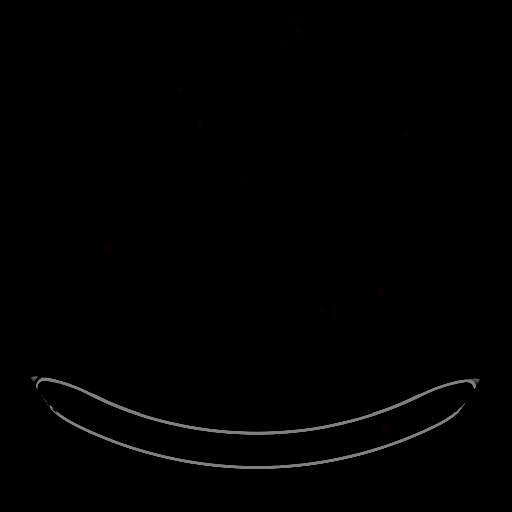
[im 99/396]
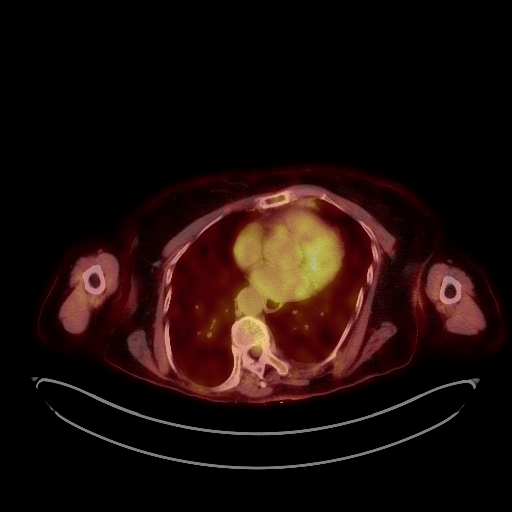
[im 198/396]
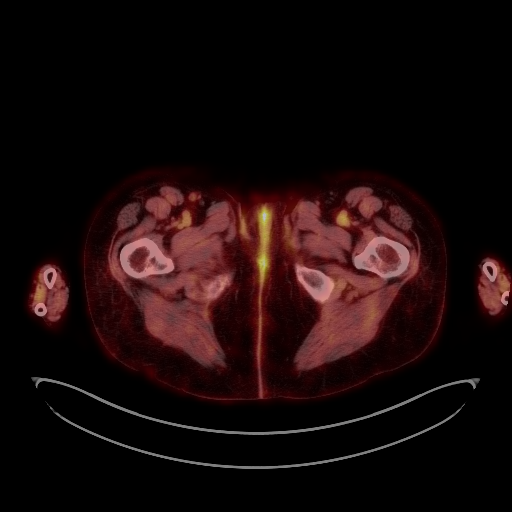
[im 297/396]
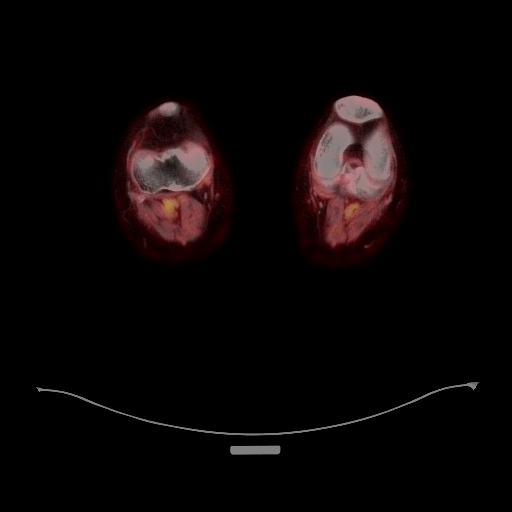
[im 396/396]
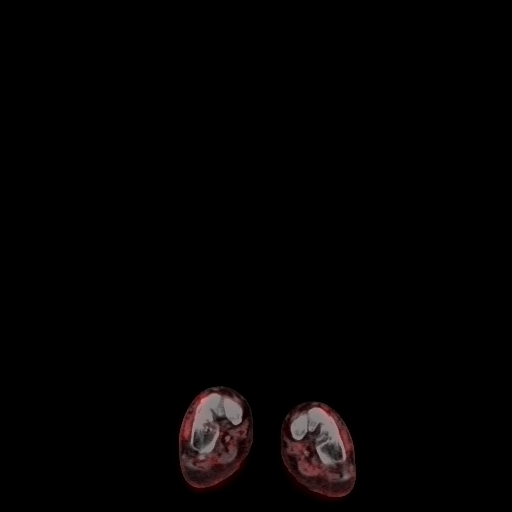

[Series 1056: results mm oncology reading · 5.0mm · 0.77mm/px · 1 of 11 slices shown]
[im 1/11]
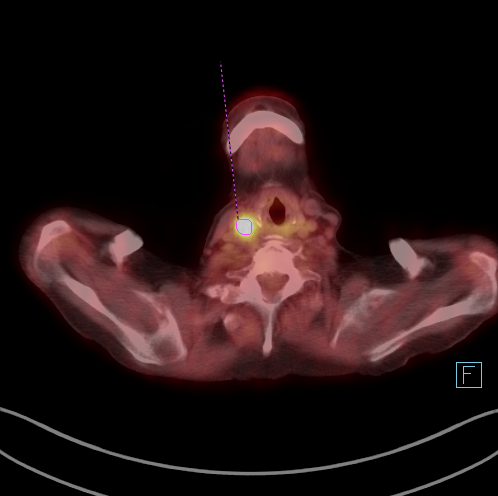

[25 of 25 positions shown; findings below may reference images not displayed]

FINDINGS: Mediastinal blood pool activity: SUV max

Background liver activity:

HEAD/NECK: Solid-appearing right lateral thyroid 1.3 cm nodule on
image 69/4, maximum SUV 18.9. Hypodense right inferior thyroid
nodule 2.5 by 2.0 cm on image 73/4, maximum SUV

Incidental CT findings: Periventricular white matter hypodensities
compatible with chronic ischemic microvascular white matter disease.

CHEST: Mild mid to distal thoracic esophageal activity, maximum SUV
4.8.

Incidental CT findings: Atherosclerotic calcification of the aortic
arch. Mild cardiomegaly. Biapical pleuroparenchymal scarring.
Bandlike opacity along the upper margin of the left major fissure is
mostly in the superior segment left lower lobe has low-grade
activity with maximum SUV at 2.4, likely from scarring.

ABDOMEN/PELVIS: Upper left para-aortic lymph node measuring 0.7 cm
in short axis on image 128/4 has maximum SUV of 4.7.

Accentuated activity the stomach cardia/gastroesophageal junction,
maximum SUV 5.8.

Photopenic cyst of the left kidney lower pole.

Incidental CT findings: Hypodense lesions in the liver appear
photopenic and are likely cysts or similar benign lesions.
Hyperdense lesions of the left kidney upper pole are too small to
characterize but most likely to be complex cysts. Aortoiliac
atherosclerotic vascular disease. Calcified uterine fibroids.

SKELETON: Faintly accentuated activity in the right iliac bone
adjacent to the SI joint, maximum SUV 3.7, but without associated CT
abnormality, probably incidental. Degenerative activity in the
spine.

Incidental CT findings: Dextroconvex upper thoracic scoliosis with
significant rotary component. Levoconvex lumbar scoliosis with
rotary component. Degenerative facet arthropathy on the left. No
well-defined lytic bony lesions.

EXTREMITIES: No significant abnormal hypermetabolic activity in this
region.

Incidental CT findings: none
IMPRESSION: 1. Two right thyroid nodules are both highly hypermetabolic. A
significant minority of hypermetabolic thyroid lesions represent
thyroid malignancy. Further workup with thyroid ultrasound is
recommended.
2. A small left upper para-aortic lymph node is mildly
hypermetabolic with maximum SUV 4.7, significance uncertain.
3. No significant abnormal accentuated osseous activity or
compelling focal findings of active multiple myeloma in the
skeleton.
4. Hypodense lesions in the liver are photopenic and likely benign.
5. Hyperdense small lesions of the left kidney upper pole are too
small to characterize but statistically most likely to be complex
cysts. If the patient has hematuria or if otherwise clinically
warranted, renal protocol MRI with and without contrast could be
utilized for further characterization.
6. Mild mid to distal accentuated thoracic esophageal activity with
maximum SUV of 4.8; mildly accentuated activity in the gastric
cardia/gastroesophageal junction with maximum SUV 5.8. Both could be
physiologic but rarely low-grade malignancy could have a similar
appearance, upper endoscopy could be utilized for further workup if
clinically warranted.
7. Other imaging findings of potential clinical significance:
Chronic ischemic microvascular white matter disease intracranially.
Aortic Atherosclerosis ([id number]). Mild cardiomegaly. Biapical
pleuroparenchymal scarring and probable scarring in the superior
segment left lower lobe. Calcified uterine fibroids. Thoracolumbar
scoliosis.

## 2020-05-12 ENCOUNTER — Other Ambulatory Visit: Payer: Self-pay | Admitting: Hematology

## 2020-05-28 ENCOUNTER — Other Ambulatory Visit: Payer: Self-pay | Admitting: Nurse Practitioner

## 2020-05-28 DIAGNOSIS — I10 Essential (primary) hypertension: Secondary | ICD-10-CM

## 2020-06-07 ENCOUNTER — Other Ambulatory Visit: Payer: Self-pay | Admitting: Nurse Practitioner

## 2020-06-07 DIAGNOSIS — I10 Essential (primary) hypertension: Secondary | ICD-10-CM

## 2020-06-12 ENCOUNTER — Inpatient Hospital Stay: Payer: Medicare Other | Attending: Hematology

## 2020-06-12 DIAGNOSIS — R771 Abnormality of globulin: Secondary | ICD-10-CM | POA: Insufficient documentation

## 2020-06-12 DIAGNOSIS — D472 Monoclonal gammopathy: Secondary | ICD-10-CM | POA: Insufficient documentation

## 2020-06-13 ENCOUNTER — Other Ambulatory Visit: Payer: Self-pay

## 2020-06-13 ENCOUNTER — Encounter: Payer: Self-pay | Admitting: Nurse Practitioner

## 2020-06-13 ENCOUNTER — Ambulatory Visit (INDEPENDENT_AMBULATORY_CARE_PROVIDER_SITE_OTHER): Payer: Medicare Other | Admitting: Nurse Practitioner

## 2020-06-13 VITALS — BP 124/68 | HR 62 | Temp 97.5°F | Ht 67.0 in | Wt 148.6 lb

## 2020-06-13 DIAGNOSIS — E559 Vitamin D deficiency, unspecified: Secondary | ICD-10-CM | POA: Diagnosis not present

## 2020-06-13 DIAGNOSIS — E782 Mixed hyperlipidemia: Secondary | ICD-10-CM | POA: Diagnosis not present

## 2020-06-13 DIAGNOSIS — F0390 Unspecified dementia without behavioral disturbance: Secondary | ICD-10-CM

## 2020-06-13 DIAGNOSIS — D472 Monoclonal gammopathy: Secondary | ICD-10-CM

## 2020-06-13 DIAGNOSIS — H6122 Impacted cerumen, left ear: Secondary | ICD-10-CM

## 2020-06-13 DIAGNOSIS — C9000 Multiple myeloma not having achieved remission: Secondary | ICD-10-CM

## 2020-06-13 DIAGNOSIS — M810 Age-related osteoporosis without current pathological fracture: Secondary | ICD-10-CM | POA: Diagnosis not present

## 2020-06-13 DIAGNOSIS — I10 Essential (primary) hypertension: Secondary | ICD-10-CM

## 2020-06-13 NOTE — Patient Instructions (Signed)
Left ear with a lot of wax- to use debrox drops, 5-10 drops twice daily for a MAX of 4 days then rinse- you can also make appt in office to have ears flushed

## 2020-06-13 NOTE — Progress Notes (Signed)
Careteam: Patient Care Team: Lauree Chandler, NP as PCP - General (Geriatric Medicine)  PLACE OF SERVICE:  [place] Directive information Does Patient Have a Medical Advance Directive?: Yes, Type of Advance Directive: Out of facility DNR (pink MOST or yellow form), Pre-existing out of facility DNR order (yellow form or pink MOST form): Yellow form placed in chart (order not valid for inpatient use);Pink MOST form placed in chart (order not valid for inpatient use), Does patient want to make changes to medical advance directive?: No - Patient declined  No Known Allergies  Chief Complaint  Patient presents with  . Medical Management of Chronic Issues    Patient is seen for 65-month followup. Only complaint is some back stiffness when getting up in the morning. No as active in the community due to [place].      HPI: Patient is a 86 y.o. female for routine follow up in office  Is not out like she use to be since [place]. No abnormal pains.   Continues to follow up with oncologist.   Dementia- does not drive. Here today alone because daughter has her granddaughter in the car. Decreased Aricept to 5 mg and doing better with decrease dose.   Osteoporosis- getting prolia in office, continues on vit d weekly  htn- controlled on hctz and lisinopril  Hyperlipidemia- continues on zocor.   Review of Systems:  Review of Systems  Constitutional: Negative for chills, fever and weight loss.  HENT: Negative for tinnitus.   Respiratory: Negative for cough, sputum production and shortness of breath.   Cardiovascular: Negative for chest pain, palpitations and leg swelling.  Gastrointestinal: Negative for abdominal pain, constipation, diarrhea and heartburn.  Genitourinary: Negative for dysuria, frequency and urgency.  Musculoskeletal: Negative for back pain, falls, joint pain and myalgias.  Skin: Negative.   Neurological: Negative for dizziness and headaches.    Psychiatric/Behavioral: Positive for memory loss. Negative for depression. The patient is not nervous/anxious and does not have insomnia.    Past Medical History:  Diagnosis Date  . Hyperlipidemia   . Hypertension   . Osteoporosis    Past Surgical History:  Procedure Laterality Date  . ABDOMINAL HYSTERECTOMY  1998   Social History:   reports that she quit smoking about 17 years ago. She smoked 1.00 pack per day. She has never used smokeless tobacco. She reports that she does not drink alcohol and does not use drugs.  Family History  Problem Relation Age of Onset  . Cancer Father   . Dementia Sister   . Breast cancer Maternal Grandmother   . Breast cancer Paternal Grandmother     Medications: Patient's Medications  New Prescriptions   No medications on file  Previous Medications   ASPIRIN 81 MG TABLET    Take 81 mg by mouth daily.   DENOSUMAB (PROLIA) 60 MG/ML SOSY INJECTION    Inject 60 mg into the skin every 6 (six) months.   DONEPEZIL (ARICEPT) 5 MG TABLET    Take 1 tablet (5 mg total) by mouth daily.   FEEDING SUPPLEMENT (BOOST HIGH PROTEIN) LIQD    Take 1 Container by mouth daily.   HYDROCHLOROTHIAZIDE (HYDRODIURIL) 25 MG TABLET    TAKE 1 TABLET(25 MG) BY MOUTH DAILY   LISINOPRIL (ZESTRIL) 5 MG TABLET    TAKE 1 TABLET(5 MG) BY MOUTH DAILY   SIMVASTATIN (ZOCOR) 20 MG TABLET    Take one tablet by mouth once daily   VITAMIN D, ERGOCALCIFEROL, (DRISDOL) 1.25  MG (50000 UNIT) CAPS CAPSULE    TAKE 1 CAPSULE BY MOUTH 1 TIME A WEEK  Modified Medications   No medications on file  Discontinued Medications   No medications on file    Physical Exam:  Vitals:   06/13/20 1008  BP: 124/68  Pulse: 62  Temp: (!) 97.5 F (36.4 C)  TempSrc: Temporal  SpO2: 97%  Weight: 148 lb 9.6 oz (67.4 kg)  Height: 5\' 7"  (1.702 m)   Body mass index is 23.27 kg/m. Wt Readings from Last 3 Encounters:  06/13/20 148 lb 9.6 oz (67.4 kg)  03/28/20 150 lb 1.6 oz (68.1 kg)  03/19/20 151 lb  (68.5 kg)    Physical Exam Constitutional:      General: She is not in acute distress.    Appearance: She is well-developed. She is not diaphoretic.  HENT:     Head: Normocephalic and atraumatic.     Right Ear: External ear normal.     Left Ear: There is impacted cerumen.  Eyes:     Conjunctiva/sclera: Conjunctivae normal.     Pupils: Pupils are equal, round, and reactive to light.  Cardiovascular:     Rate and Rhythm: Normal rate and regular rhythm.     Heart sounds: Normal heart sounds.  Pulmonary:     Effort: Pulmonary effort is normal.     Breath sounds: Normal breath sounds.  Abdominal:     General: Bowel sounds are normal.     Palpations: Abdomen is soft.  Musculoskeletal:        General: No tenderness.     Cervical back: Normal range of motion and neck supple.     Right lower leg: Edema (1+) present.     Left lower leg: Edema (1+) present.  Skin:    General: Skin is warm and dry.  Neurological:     Mental Status: She is alert and oriented to person, place, and time. Mental status is at baseline.     Gait: Gait normal.  Psychiatric:        Mood and Affect: Mood normal.        Behavior: Behavior normal.     Labs reviewed: Basic Metabolic Panel: Recent Labs    11/09/19 1125 01/11/20 1102 02/13/20 0948 03/21/20 1029  NA 138 140  --  137  K 4.0 3.8  --  3.8  CL 102 104  --  102  CO2 27 29  --  28  GLUCOSE 81 83  --  81  BUN 22 25*  --  21  CREATININE 1.02* 1.13*  --  1.07*  CALCIUM 10.1 9.5  --  10.2  TSH  --   --  5.85*  --    Liver Function Tests: Recent Labs    11/09/19 1125 01/11/20 1102 03/21/20 1029  AST 14* 15 16  ALT 9 9 10   ALKPHOS 58 58 53  BILITOT 0.3 0.2* 0.4  PROT 10.3* 10.9* 11.0*  ALBUMIN 3.4* 3.4* 3.3*   No results for input(s): LIPASE, AMYLASE in the last 8760 hours. No results for input(s): AMMONIA in the last 8760 hours. CBC: Recent Labs    11/09/19 1125 01/11/20 1102 03/21/20 1029  WBC 4.6 4.5 3.8*  NEUTROABS 2.7 2.5  2.2  HGB 12.0 11.4* 11.8*  HCT 38.8 36.7 38.9  MCV 86.4 87.2 87.0  PLT 235 230 205   Lipid Panel: No results for input(s): CHOL, HDL, LDLCALC, TRIG, CHOLHDL, LDLDIRECT in the last 8760 hours. TSH: Recent Labs  02/13/20 0948  TSH 5.85*   A1C: No results found for: HGBA1C   Assessment/Plan 1. Vitamin D deficiency -continues on vit d 50,000 units daily - Vitamin D, 25-hydroxy  2. Mixed hyperlipidemia Continues on simvastatin 20 mg daily - Lipid panel  3. Osteoporosis, unspecified osteoporosis type, unspecified pathological fracture presence -continues on prolia with cal and vit d, last bone density 08/2019  4. HYPERTENSION, BENIGN ESSENTIAL Controlled on lisinopril/hctz, continue dash diet.   5. Smoldering myeloma ([place]) -followed by oncology/hematology. Continues "watchful waiting" and monitoring labs at this time.  6. Dementia without behavioral disturbance, unspecified dementia type ([place]) Stable, she continues to live alone and do most of her ADLs. Family helps her but very independent. No longer drives.   7. Left ear impacted cerumen Could not tolerate wax removal, recommended to use debrox and follow up in office for lavage.   Next appt: 6 months, sooner if needed Schylar Wuebker K. Elmer, [facility] [phone]

## 2020-06-14 ENCOUNTER — Encounter: Payer: Self-pay | Admitting: Nurse Practitioner

## 2020-06-14 ENCOUNTER — Telehealth: Payer: Self-pay

## 2020-06-14 ENCOUNTER — Other Ambulatory Visit: Payer: Self-pay | Admitting: *Deleted

## 2020-06-14 ENCOUNTER — Ambulatory Visit (INDEPENDENT_AMBULATORY_CARE_PROVIDER_SITE_OTHER): Payer: Medicare Other | Admitting: Nurse Practitioner

## 2020-06-14 DIAGNOSIS — Z0000 Encounter for general adult medical examination without abnormal findings: Secondary | ICD-10-CM | POA: Diagnosis not present

## 2020-06-14 LAB — LIPID PANEL
Cholesterol: 152 mg/dL (ref <200)
HDL: 42 mg/dL — ABNORMAL LOW (ref >=50)
LDL Cholesterol (Calc): 89 mg/dL (calc)
Non-HDL Cholesterol (Calc): 110 mg/dL (calc) (ref <130)
Total CHOL/HDL Ratio: 3.6 (calc) (ref <5.0)
Triglycerides: 112 mg/dL (ref <150)

## 2020-06-14 LAB — VITAMIN D 25 HYDROXY (VIT D DEFICIENCY, FRACTURES): Vit D, 25-Hydroxy: 97 ng/mL (ref 30–100)

## 2020-06-14 MED ORDER — DENOSUMAB 60 MG/ML ~~LOC~~ SOSY: 60.0000 mg | PREFILLED_SYRINGE | SUBCUTANEOUS | 0 refills | Status: DC

## 2020-06-14 NOTE — Telephone Encounter (Signed)
Refill Requested by Lattie Haw. Escribed to pharmacy.

## 2020-06-14 NOTE — Progress Notes (Signed)
This service is provided via telemedicine  No vital signs collected/recorded due to the encounter was a telemedicine visit.   Location of patient (ex: home, work):  Home  Patient consents to a telephone visit:  Yes, see telephone encounter dated 06/14/2020  Location of the provider (ex: office, home):  [place]  Name of any referring provider:  N/A  Names of all persons participating in the telemedicine service and their role in the encounter:  Sherrie Mustache, Nurse Practitioner, Carroll Kinds, CMA, patient, Buena Boehm (son) and Eylin Pontarelli (daughter-in-law)  Time spent on call:  10 minutes with medical assistant

## 2020-06-14 NOTE — Telephone Encounter (Signed)
Ms. Jasmine Fletcher, Jasmine Fletcher are scheduled for a virtual visit with your provider today.    Just as we do with appointments in the office, we must obtain your consent to participate.  Your consent will be active for this visit and any virtual visit you may have with one of our providers in the next 365 days.    If you have a MyChart account, I can also send a copy of this consent to you electronically.  All virtual visits are billed to your insurance company just like a traditional visit in the office.  As this is a virtual visit, video technology does not allow for your provider to perform a traditional examination.  This may limit your provider's ability to fully assess your condition.  If your provider identifies any concerns that need to be evaluated in person or the need to arrange testing such as labs, EKG, etc, we will make arrangements to do so.    Although advances in technology are sophisticated, we cannot ensure that it will always work on either your end or our end.  If the connection with a video visit is poor, we may have to switch to a telephone visit.  With either a video or telephone visit, we are not always able to ensure that we have a secure connection.   I need to obtain your verbal consent now.   Are you willing to proceed with your visit today?   Jasmine Fletcher has provided verbal consent on 06/14/2020 for a virtual visit (video or telephone).   Carroll Kinds, CMA 06/14/2020  9:48 AM

## 2020-06-14 NOTE — Patient Instructions (Signed)
Jasmine Fletcher , Thank you for taking time to come for your Medicare Wellness Visit. I appreciate your ongoing commitment to your health goals. Please review the following plan we discussed and let me know if I can assist you in the future.   Screening recommendations/referrals: Colonoscopy aged out Mammogram aged out Bone Density  Up to date Recommended yearly ophthalmology/optometry visit for glaucoma screening and checkup Recommended yearly dental visit for hygiene and checkup  Vaccinations: Influenza vaccine up to date Pneumococcal vaccine up to date Tdap vaccine up to date Shingles vaccine na    Advanced directives: recommended to complete and place on file.   Conditions/risks identified: advance age, memory loss, hypertension, hyperlipidemia.   Next appointment: 1 year    Preventive Care 84 Years and Older, Female Preventive care refers to lifestyle choices and visits with your health care provider that can promote health and wellness. What does preventive care include?  A yearly physical exam. This is also called an annual well check.  Dental exams once or twice a year.  Routine eye exams. Ask your health care provider how often you should have your eyes checked.  Personal lifestyle choices, including:  Daily care of your teeth and gums.  Regular physical activity.  Eating a healthy diet.  Avoiding tobacco and drug use.  Limiting alcohol use.  Practicing safe sex.  Taking low-dose aspirin every day.  Taking vitamin and mineral supplements as recommended by your health care provider. What happens during an annual well check? The services and screenings done by your health care provider during your annual well check will depend on your age, overall health, lifestyle risk factors, and family history of disease. Counseling  Your health care provider may ask you questions about your:  Alcohol use.  Tobacco use.  Drug use.  Emotional well-being.  Home and  relationship well-being.  Sexual activity.  Eating habits.  History of falls.  Memory and ability to understand (cognition).  Work and work Statistician.  Reproductive health. Screening  You may have the following tests or measurements:  Height, weight, and BMI.  Blood pressure.  Lipid and cholesterol levels. These may be checked every 5 years, or more frequently if you are over 15 years old.  Skin check.  Lung cancer screening. You may have this screening every year starting at age 61 if you have a 30-pack-year history of smoking and currently smoke or have quit within the past 15 years.  Fecal occult blood test (FOBT) of the stool. You may have this test every year starting at age 15.  Flexible sigmoidoscopy or colonoscopy. You may have a sigmoidoscopy every 5 years or a colonoscopy every 10 years starting at age 52.  Hepatitis C blood test.  Hepatitis B blood test.  Sexually transmitted disease (STD) testing.  Diabetes screening. This is done by checking your blood sugar (glucose) after you have not eaten for a while (fasting). You may have this done every 1-3 years.  Bone density scan. This is done to screen for osteoporosis. You may have this done starting at age 30.  Mammogram. This may be done every 1-2 years. Talk to your health care provider about how often you should have regular mammograms. Talk with your health care provider about your test results, treatment options, and if necessary, the need for more tests. Vaccines  Your health care provider may recommend certain vaccines, such as:  Influenza vaccine. This is recommended every year.  Tetanus, diphtheria, and acellular pertussis (Tdap, Td) vaccine.  You may need a Td booster every 10 years.  Zoster vaccine. You may need this after age 24.  Pneumococcal 13-valent conjugate (PCV13) vaccine. One dose is recommended after age 42.  Pneumococcal polysaccharide (PPSV23) vaccine. One dose is recommended after  age 68. Talk to your health care provider about which screenings and vaccines you need and how often you need them. This information is not intended to replace advice given to you by your health care provider. Make sure you discuss any questions you have with your health care provider. Document Released: 12/21/2015 Document Revised: 08/13/2016 Document Reviewed: 09/25/2015 Elsevier Interactive Patient Education  2017 [place] Prevention in the Home Falls can cause injuries. They can happen to people of all ages. There are many things you can do to make your home safe and to help prevent falls. What can I do on the outside of my home?  Regularly fix the edges of walkways and driveways and fix any cracks.  Remove anything that might make you trip as you walk through a door, such as a raised step or threshold.  Trim any bushes or trees on the path to your home.  Use bright outdoor lighting.  Clear any walking paths of anything that might make someone trip, such as rocks or tools.  Regularly check to see if handrails are loose or broken. Make sure that both sides of any steps have handrails.  Any raised decks and porches should have guardrails on the edges.  Have any leaves, snow, or ice cleared regularly.  Use sand or salt on walking paths during winter.  Clean up any spills in your garage right away. This includes oil or grease spills. What can I do in the bathroom?  Use night lights.  Install grab bars by the toilet and in the tub and shower. Do not use towel bars as grab bars.  Use non-skid mats or decals in the tub or shower.  If you need to sit down in the shower, use a plastic, non-slip stool.  Keep the floor dry. Clean up any water that spills on the floor as soon as it happens.  Remove soap buildup in the tub or shower regularly.  Attach bath mats securely with double-sided non-slip rug tape.  Do not have throw rugs and other things on the floor that can  make you trip. What can I do in the bedroom?  Use night lights.  Make sure that you have a light by your bed that is easy to reach.  Do not use any sheets or blankets that are too big for your bed. They should not hang down onto the floor.  Have a firm chair that has side arms. You can use this for support while you get dressed.  Do not have throw rugs and other things on the floor that can make you trip. What can I do in the kitchen?  Clean up any spills right away.  Avoid walking on wet floors.  Keep items that you use a lot in easy-to-reach places.  If you need to reach something above you, use a strong step stool that has a grab bar.  Keep electrical cords out of the way.  Do not use floor polish or wax that makes floors slippery. If you must use wax, use non-skid floor wax.  Do not have throw rugs and other things on the floor that can make you trip. What can I do with my stairs?  Do not leave any  items on the stairs.  Make sure that there are handrails on both sides of the stairs and use them. Fix handrails that are broken or loose. Make sure that handrails are as long as the stairways.  Check any carpeting to make sure that it is firmly attached to the stairs. Fix any carpet that is loose or worn.  Avoid having throw rugs at the top or bottom of the stairs. If you do have throw rugs, attach them to the floor with carpet tape.  Make sure that you have a light switch at the top of the stairs and the bottom of the stairs. If you do not have them, ask someone to add them for you. What else can I do to help prevent falls?  Wear shoes that:  Do not have high heels.  Have rubber bottoms.  Are comfortable and fit you well.  Are closed at the toe. Do not wear sandals.  If you use a stepladder:  Make sure that it is fully opened. Do not climb a closed stepladder.  Make sure that both sides of the stepladder are locked into place.  Ask someone to hold it for you,  if possible.  Clearly mark and make sure that you can see:  Any grab bars or handrails.  First and last steps.  Where the edge of each step is.  Use tools that help you move around (mobility aids) if they are needed. These include:  Canes.  Walkers.  Scooters.  Crutches.  Turn on the lights when you go into a dark area. Replace any light bulbs as soon as they burn out.  Set up your furniture so you have a clear path. Avoid moving your furniture around.  If any of your floors are uneven, fix them.  If there are any pets around you, be aware of where they are.  Review your medicines with your doctor. Some medicines can make you feel dizzy. This can increase your chance of falling. Ask your doctor what other things that you can do to help prevent falls. This information is not intended to replace advice given to you by your health care provider. Make sure you discuss any questions you have with your health care provider. Document Released: 09/20/2009 Document Revised: 05/01/2016 Document Reviewed: 12/29/2014 Elsevier Interactive Patient Education  2017 [employer].

## 2020-06-14 NOTE — Progress Notes (Signed)
Subjective:   Jasmine Fletcher is a 86 y.o. female who presents for Medicare Annual (Subsequent) preventive examination.  Review of Systems     Cardiac Risk Factors include: advanced age (>59men, >61 women);dyslipidemia;hypertension     Objective:    There were no vitals filed for this visit. There is no height or weight on file to calculate BMI.  Advanced Directives 06/14/2020 06/13/2020 02/13/2020 10/17/2019 09/02/2019 08/05/2019 06/14/2019  Does Patient Have a Medical Advance Directive? Yes Yes No No No No No  Type of Advance Directive Out of facility DNR (pink MOST or yellow form) Out of facility DNR (pink MOST or yellow form) - - - - -  Does patient want to make changes to medical advance directive? No - Patient declined No - Patient declined - - - - -  Copy of Press photographer in Chart? - - - - - - -  Would patient like information on creating a medical advance directive? - - Yes (MAU/Ambulatory/Procedural Areas - Information given) No - Patient declined No - Patient declined No - Patient declined Yes (MAU/Ambulatory/Procedural Areas - Information given)  Pre-existing out of facility DNR order (yellow form or pink MOST form) - Yellow form placed in chart (order not valid for inpatient use);Pink MOST form placed in chart (order not valid for inpatient use) - - - - -    Current Medications (verified) Outpatient Encounter Medications as of 06/14/2020  Medication Sig  . aspirin 81 MG tablet Take 81 mg by mouth daily.  Marland Kitchen denosumab (PROLIA) 60 MG/ML SOSY injection Inject 60 mg into the skin every 6 (six) months.  . donepezil (ARICEPT) 5 MG tablet Take 1 tablet (5 mg total) by mouth daily.  . feeding supplement (BOOST HIGH PROTEIN) LIQD Take 1 Container by mouth daily.  . hydrochlorothiazide (HYDRODIURIL) 25 MG tablet TAKE 1 TABLET(25 MG) BY MOUTH DAILY  . lisinopril (ZESTRIL) 5 MG tablet TAKE 1 TABLET(5 MG) BY MOUTH DAILY  . simvastatin (ZOCOR) 20 MG tablet Take one tablet by mouth  once daily  . Vitamin D, Ergocalciferol, (DRISDOL) 1.25 MG (50000 UNIT) CAPS capsule TAKE 1 CAPSULE BY MOUTH 1 TIME A WEEK   No facility-administered encounter medications on file as of 06/14/2020.    Allergies (verified) Patient has no known allergies.   History: Past Medical History:  Diagnosis Date  . Hyperlipidemia   . Hypertension   . Osteoporosis    Past Surgical History:  Procedure Laterality Date  . ABDOMINAL HYSTERECTOMY  1998   Family History  Problem Relation Age of Onset  . Cancer Father   . Dementia Sister   . Breast cancer Maternal Grandmother   . Breast cancer Paternal Grandmother    Social History   Socioeconomic History  . Marital status: Single    Spouse name: Not on file  . Number of children: Not on file  . Years of education: Not on file  . Highest education level: Not on file  Occupational History  . Not on file  Tobacco Use  . Smoking status: Former Smoker    Packs/day: 1.00    Quit date: 12/08/2002    Years since quitting: 17.5  . Smokeless tobacco: Never Used  . Tobacco comment: Quit t age 36   Vaping Use  . Vaping Use: Never used  Substance and Sexual Activity  . Alcohol use: No    Alcohol/week: 0.0 standard drinks  . Drug use: No  . Sexual activity: Not on file  Other  Topics Concern  . Not on file  Social History Narrative  . Not on file   Social Determinants of Health   Financial Resource Strain:   . Difficulty of Paying Living Expenses:   Food Insecurity:   . Worried About Charity fundraiser in the Last Year:   . Arboriculturist in the Last Year:   Transportation Needs:   . Film/video editor (Medical):   Marland Kitchen Lack of Transportation (Non-Medical):   Physical Activity:   . Days of Exercise per Week:   . Minutes of Exercise per Session:   Stress:   . Feeling of Stress :   Social Connections:   . Frequency of Communication with Friends and Family:   . Frequency of Social Gatherings with Friends and Family:   . Attends  Religious Services:   . Active Member of Clubs or Organizations:   . Attends Archivist Meetings:   Marland Kitchen Marital Status:     Tobacco Counseling Counseling given: Not Answered Comment: Quit t age 69    Clinical Intake:  Pre-visit preparation completed: Yes  Pain : No/denies pain     BMI - recorded: 23 Nutritional Status: BMI of 19-24  Normal Nutritional Risks: None Diabetes: No  How often do you need to have someone help you when you read instructions, pamphlets, or other written materials from your doctor or pharmacy?: 1 - Never  Diabetic?no         Activities of Daily Living In your present state of health, do you have any difficulty performing the following activities: 06/14/2020 09/02/2019  Hearing? N N  Vision? N N  Difficulty concentrating or making decisions? N N  Walking or climbing stairs? N N  Dressing or bathing? N N  Doing errands, shopping? N -  Preparing Food and eating ? N -  Using the Toilet? Y -  In the past six months, have you accidently leaked urine? N -  Do you have problems with loss of bowel control? N -  Managing your Medications? N -  Managing your Finances? N -  Housekeeping or managing your Housekeeping? N -  Some recent data might be hidden    Patient Care Team: Lauree Chandler, NP as PCP - General (Geriatric Medicine)  Indicate any recent Medical Services you may have received from other than [facility] providers in the past year (date may be approximate).     Assessment:   This is a routine wellness examination for [place].  Hearing/Vision screen  Hearing Screening   125Hz  250Hz  500Hz  1000Hz  2000Hz  3000Hz  4000Hz  6000Hz  8000Hz   Right ear:           Left ear:           Comments: Patient has no hearing issues  Vision Screening Comments: Patient has no vision problems. Patient has not had eye exam within past year  Dietary issues and exercise activities discussed: Current Exercise Habits: The patient does not participate  in regular exercise at present  Goals    . Patient Stated     To stay active       Depression Screen PHQ 2/9 Scores 10/17/2019 06/14/2019 03/23/2019 08/06/2018 06/04/2018 06/04/2018 05/27/2017  PHQ - 2 Score 0 0 0 0 0 0 0  PHQ- 9 Score - - - - - - -    Fall Risk Fall Risk  06/14/2020 06/13/2020 02/13/2020 10/17/2019 06/21/2019  Falls in the past year? 0 0 0 0 0  Number falls in  past yr: 0 - 0 0 0  Injury with Fall? 0 - 0 0 0    Any stairs in or around the home? No  If so, are there any without handrails? No  Home free of loose throw rugs in walkways, pet beds, electrical cords, etc? Yes  Adequate lighting in your home to reduce risk of falls? Yes   ASSISTIVE DEVICES UTILIZED TO PREVENT FALLS:  Life alert? No  Use of a cane, walker or w/c? No  Grab bars in the bathroom? No  Shower chair or bench in shower? Yes  Elevated toilet seat or a handicapped toilet? No   TIMED UP AND GO:na  Cognitive Function: MMSE - Mini Mental State Exam 06/14/2019 06/04/2018 05/27/2017 03/14/2016 07/25/2014  Not completed: - - - (No Data) -  Orientation to time 5 5 5 5 5   Orientation to Place 4 5 5 5 5   Registration 3 3 3 3 3   Attention/ Calculation 2 3 0 4 4  Recall 2 2 3 2 2   Language- name 2 objects 2 2 2 2 2   Language- repeat 1 1 1 1 1   Language- follow 3 step command 3 3 3 3 3   Language- read & follow direction 1 1 1 1 1   Write a sentence 1 1 1 1 1   Copy design 0 0 0 0 0  Total score 24 26 24 27 27      6CIT Screen 06/14/2020  What Year? 0 points  What month? 0 points  What time? 0 points  Count back from 20 4 points  Months in reverse 4 points  Repeat phrase 10 points  Total Score 18    Immunizations Immunization History  Administered Date(s) Administered  . Fluad Quad(high Dose 65+) 10/17/2019  . Influenza Whole 12/24/2006  . Influenza,inj,Quad PF,6+ Mos 01/30/2015, 10/02/2017, 08/06/2018  . Influenza-Unspecified 09/18/2011, 08/26/2013, 09/29/2016  . PFIZER SARS-COV-2 Vaccination 01/14/2020,  02/04/2020  . Pneumococcal Conjugate-13 07/25/2014  . Pneumococcal Polysaccharide-23 12/08/1996, 10/16/2011  . Td 11/05/2005  . Tdap 10/16/2011    TDAP status: Up to date Flu Vaccine status: Up to date Pneumococcal vaccine status: Up to date Covid-19 vaccine status: Completed vaccines  Qualifies for Shingles Vaccine? No   Zostavax completed No   Shingrix Completed?: Yes  Screening Tests Health Maintenance  Topic Date Due  . INFLUENZA VACCINE  07/08/2020  . TETANUS/TDAP  10/15/2021  . DEXA SCAN  Completed  . COVID-19 Vaccine  Completed  . PNA vac Low Risk Adult  Completed    Health Maintenance  There are no preventive care reminders to display for this patient.  Colorectal cancer screening: No longer required.  Mammogram status: No longer required.  Bone Density status: Completed 2020. Results reflect: Bone density results: OSTEOPOROSIS. Repeat every 2 years.  Lung Cancer Screening: (Low Dose CT Chest recommended if Age 48-80 years, 30 pack-year currently smoking OR have quit w/in 15years.) does not qualify.   Lung Cancer Screening Referral: na  Additional Screening:  Hepatitis C Screening: does not qualify; Completed na  Vision Screening: Recommended annual ophthalmology exams for early detection of glaucoma and other disorders of the eye. Is the patient up to date with their annual eye exam?  No  Who is the provider or what is the name of the office in which the patient attends annual eye exams? Dr Nyoka Cowden If pt is not established with a provider, would they like to be referred to a provider to establish care? No .   Dental  Screening: Recommended annual dental exams for proper oral hygiene  Community Resource Referral / Chronic Care Management: CRR required this visit?  No   CCM required this visit?  No      Plan:     I have personally reviewed and noted the following in the patient's chart:   . Medical and social history . Use of alcohol, tobacco or  illicit drugs  . Current medications and supplements . Functional ability and status . Nutritional status . Physical activity . Advanced directives . List of other physicians . Hospitalizations, surgeries, and ER visits in previous 12 months . Vitals . Screenings to include cognitive, depression, and falls . Referrals and appointments  In addition, I have reviewed and discussed with patient certain preventive protocols, quality metrics, and best practice recommendations. A written personalized care plan for preventive services as well as general preventive health recommendations were provided to patient.     Lauree Chandler, NP   06/14/2020

## 2020-06-18 ENCOUNTER — Ambulatory Visit (INDEPENDENT_AMBULATORY_CARE_PROVIDER_SITE_OTHER): Payer: Medicare Other | Admitting: *Deleted

## 2020-06-18 ENCOUNTER — Other Ambulatory Visit: Payer: Self-pay

## 2020-06-18 DIAGNOSIS — M810 Age-related osteoporosis without current pathological fracture: Secondary | ICD-10-CM | POA: Diagnosis not present

## 2020-06-18 MED ORDER — DENOSUMAB 60 MG/ML ~~LOC~~ SOSY
60.0000 mg | PREFILLED_SYRINGE | Freq: Once | SUBCUTANEOUS | Status: AC
  Administered 2020-06-18: 60 mg via SUBCUTANEOUS

## 2020-06-20 ENCOUNTER — Inpatient Hospital Stay (HOSPITAL_BASED_OUTPATIENT_CLINIC_OR_DEPARTMENT_OTHER): Payer: Medicare Other | Admitting: Hematology

## 2020-06-20 ENCOUNTER — Other Ambulatory Visit: Payer: Self-pay

## 2020-06-20 ENCOUNTER — Inpatient Hospital Stay: Payer: Medicare Other

## 2020-06-20 VITALS — BP 150/79 | HR 62 | Temp 97.3°F | Resp 18 | Ht 67.0 in | Wt 148.4 lb

## 2020-06-20 DIAGNOSIS — R771 Abnormality of globulin: Secondary | ICD-10-CM | POA: Diagnosis not present

## 2020-06-20 DIAGNOSIS — D472 Monoclonal gammopathy: Secondary | ICD-10-CM

## 2020-06-20 DIAGNOSIS — E559 Vitamin D deficiency, unspecified: Secondary | ICD-10-CM

## 2020-06-20 DIAGNOSIS — C9000 Multiple myeloma not having achieved remission: Secondary | ICD-10-CM

## 2020-06-20 LAB — CMP (CANCER CENTER ONLY)
ALT: 8 U/L (ref 0–44)
AST: 16 U/L (ref 15–41)
Albumin: 3.3 g/dL — ABNORMAL LOW (ref 3.5–5.0)
Alkaline Phosphatase: 52 U/L (ref 38–126)
Anion gap: 6 (ref 5–15)
BUN: 25 mg/dL — ABNORMAL HIGH (ref 8–23)
CO2: 28 mmol/L (ref 22–32)
Calcium: 10.6 mg/dL — ABNORMAL HIGH (ref 8.9–10.3)
Chloride: 103 mmol/L (ref 98–111)
Creatinine: 1.11 mg/dL — ABNORMAL HIGH (ref 0.44–1.00)
GFR, Est AFR Am: 52 mL/min — ABNORMAL LOW (ref >60)
GFR, Estimated: 45 mL/min — ABNORMAL LOW (ref >60)
Glucose, Bld: 84 mg/dL (ref 70–99)
Potassium: 3.6 mmol/L (ref 3.5–5.1)
Sodium: 137 mmol/L (ref 135–145)
Total Bilirubin: 0.2 mg/dL — ABNORMAL LOW (ref 0.3–1.2)
Total Protein: 10.7 g/dL — ABNORMAL HIGH (ref 6.5–8.1)

## 2020-06-20 LAB — VITAMIN D 25 HYDROXY (VIT D DEFICIENCY, FRACTURES): Vit D, 25-Hydroxy: 129.02 ng/mL — ABNORMAL HIGH (ref 30–100)

## 2020-06-20 LAB — CBC WITH DIFFERENTIAL/PLATELET
Abs Immature Granulocytes: 0.01 10*3/uL (ref 0.00–0.07)
Basophils Absolute: 0 10*3/uL (ref 0.0–0.1)
Basophils Relative: 1 %
Eosinophils Absolute: 0 10*3/uL (ref 0.0–0.5)
Eosinophils Relative: 1 %
HCT: 35.3 % — ABNORMAL LOW (ref 36.0–46.0)
Hemoglobin: 11.1 g/dL — ABNORMAL LOW (ref 12.0–15.0)
Immature Granulocytes: 0 %
Lymphocytes Relative: 34 %
Lymphs Abs: 1.5 10*3/uL (ref 0.7–4.0)
MCH: 27.3 pg (ref 26.0–34.0)
MCHC: 31.4 g/dL (ref 30.0–36.0)
MCV: 86.9 fL (ref 80.0–100.0)
Monocytes Absolute: 0.5 10*3/uL (ref 0.1–1.0)
Monocytes Relative: 11 %
Neutro Abs: 2.3 10*3/uL (ref 1.7–7.7)
Neutrophils Relative %: 53 %
Platelets: 205 10*3/uL (ref 150–400)
RBC: 4.06 MIL/uL (ref 3.87–5.11)
RDW: 14.3 % (ref 11.5–15.5)
WBC: 4.4 10*3/uL (ref 4.0–10.5)
nRBC: 0 % (ref 0.0–0.2)

## 2020-06-20 NOTE — Progress Notes (Signed)
HEMATOLOGY/ONCOLOGY NOTE  Date of Service: 06/20/2020  Patient Care Team: Lauree Chandler, NP as PCP - General (Geriatric Medicine)   CHIEF COMPLAINTS:  F/u for smoldering myeloma   HISTORY OF PRESENTING ILLNESS:  Jasmine Fletcher is a wonderful 86 y.o. female who has been referred to [place] by Lauree Chandler, NP for evaluation and management of elevated serum globulin levels. The pt reports that she is doing well overall.  The pt reports that her appetite has decreased recently but that she eats a healthy diet. She notes mild fatigue. Denies new bone pain, back pain, hip pain, infection concerns, and any other new concerns.  She has been getting a Prolia shot every 6 months for the past 2 years. She has also been taking Aricept but notes that her memory is worsening. Her daughter is her health care POA, but the pt makes her own healthcare decisions at this time.  Most recent lab results (06/14/2019) of CBC is as follows: all values are WNL except for HGB at 11.4, MCH at 26.8. 06/21/2019 UPEP revealed protein/creat ratio at 209, total protein at 29, and two abnormal protein bands 06/14/2019 SPEP revealed total protein at 10.1, Alpha 2 at 1.0, gamma globulin at 4.1, abnormal protein band1 at 3.9  On review of systems, pt reports some fatigue, decreased appetite, and denies new bone pain, back pain, hip pain, infection concerns, and any other symptoms.   On PMHx the pt reports that she is taking BP medication  On Social Hx the pt reports that she quit smoking in 2004 and does not drink alcohol   INTERVAL HISTORY: Jasmine Fletcher is a 86 y.o. female here for evaluation and management of plasma cell dyscrasia. We are joined today by her daughter. The patient's last visit with [place] was on 03/28/2020. The pt reports that she is doing well overall.  The pt reports that she is tired when she wakes and has to often rest throughout the day. Pt's daughter states that she used to be more  active when she was able to go to the senior center and believes that this is impacting her energy level. She does her best to spend time outside, but has concerns about strangers around her neighborhood. Her appetite has been steady and she has had no new urinary or bowel symptoms.   On review of systems, pt reports fatigue and denies fevers, chills, new bone pain, urinary/bowel habit changes, abdominal pain, low appetite and any other symptoms.    MEDICAL HISTORY:  Past Medical History:  Diagnosis Date  . Hyperlipidemia   . Hypertension   . Osteoporosis     SURGICAL HISTORY: Past Surgical History:  Procedure Laterality Date  . ABDOMINAL HYSTERECTOMY  1998    SOCIAL HISTORY: Social History   Socioeconomic History  . Marital status: Single    Spouse name: Not on file  . Number of children: Not on file  . Years of education: Not on file  . Highest education level: Not on file  Occupational History  . Not on file  Tobacco Use  . Smoking status: Former Smoker    Packs/day: 1.00    Quit date: 12/08/2002    Years since quitting: 17.5  . Smokeless tobacco: Never Used  . Tobacco comment: Quit t age 63   Vaping Use  . Vaping Use: Never used  Substance and Sexual Activity  . Alcohol use: No    Alcohol/week: 0.0 standard drinks  . Drug use: No  .  Sexual activity: Not on file  Other Topics Concern  . Not on file  Social History Narrative  . Not on file   Social Determinants of Health   Financial Resource Strain:   . Difficulty of Paying Living Expenses:   Food Insecurity:   . Worried About Charity fundraiser in the Last Year:   . Arboriculturist in the Last Year:   Transportation Needs:   . Film/video editor (Medical):   Marland Kitchen Lack of Transportation (Non-Medical):   Physical Activity:   . Days of Exercise per Week:   . Minutes of Exercise per Session:   Stress:   . Feeling of Stress :   Social Connections:   . Frequency of Communication with Friends and Family:     . Frequency of Social Gatherings with Friends and Family:   . Attends Religious Services:   . Active Member of Clubs or Organizations:   . Attends Archivist Meetings:   Marland Kitchen Marital Status:   Intimate Partner Violence:   . Fear of Current or Ex-Partner:   . Emotionally Abused:   Marland Kitchen Physically Abused:   . Sexually Abused:     FAMILY HISTORY: Family History  Problem Relation Age of Onset  . Cancer Father   . Dementia Sister   . Breast cancer Maternal Grandmother   . Breast cancer Paternal Grandmother     ALLERGIES:  has No Known Allergies.  MEDICATIONS:  Current Outpatient Medications  Medication Sig Dispense Refill  . aspirin 81 MG tablet Take 81 mg by mouth daily.    Marland Kitchen denosumab (PROLIA) 60 MG/ML SOSY injection Inject 60 mg into the skin every 6 (six) months. 1 mL 0  . donepezil (ARICEPT) 5 MG tablet Take 1 tablet (5 mg total) by mouth daily. 90 tablet 0  . feeding supplement (BOOST HIGH PROTEIN) LIQD Take 1 Container by mouth daily.    . hydrochlorothiazide (HYDRODIURIL) 25 MG tablet TAKE 1 TABLET(25 MG) BY MOUTH DAILY 90 tablet 1  . lisinopril (ZESTRIL) 5 MG tablet TAKE 1 TABLET(5 MG) BY MOUTH DAILY 90 tablet 1  . simvastatin (ZOCOR) 20 MG tablet Take one tablet by mouth once daily 90 tablet 1  . Vitamin D, Ergocalciferol, (DRISDOL) 1.25 MG (50000 UNIT) CAPS capsule TAKE 1 CAPSULE BY MOUTH 1 TIME A WEEK 12 capsule 2   No current facility-administered medications for this visit.    REVIEW OF SYSTEMS:   A 10+ POINT REVIEW OF SYSTEMS WAS OBTAINED including neurology, dermatology, psychiatry, cardiac, respiratory, lymph, extremities, GI, GU, Musculoskeletal, constitutional, breasts, reproductive, HEENT.  All pertinent positives are noted in the HPI.  All others are negative.   PHYSICAL EXAMINATION: There were no vitals filed for this visit. Wt Readings from Last 3 Encounters:  06/13/20 148 lb 9.6 oz (67.4 kg)  03/28/20 150 lb 1.6 oz (68.1 kg)  03/19/20 151 lb  (68.5 kg)   There is no height or weight on file to calculate BMI.    ECOG FS:2 - Symptomatic, <50% confined to bed   Exam was given in a chair   GENERAL:alert, in no acute distress and comfortable SKIN: no acute rashes, no significant lesions EYES: conjunctiva are pink and non-injected, sclera anicteric OROPHARYNX: MMM, no exudates, no oropharyngeal erythema or ulceration NECK: supple, no JVD LYMPH:  no palpable lymphadenopathy in the cervical, axillary or inguinal regions LUNGS: clear to auscultation b/l with normal respiratory effort HEART: regular rate & rhythm ABDOMEN:  normoactive bowel sounds ,  non tender, not distended. No palpable hepatosplenomegaly.  Extremity: no pedal edema PSYCH: alert & oriented x 3 with fluent speech NEURO: no focal motor/sensory deficits  LABORATORY DATA:  I have reviewed the data as listed  . CBC Latest Ref Rng & Units 03/21/2020 01/11/2020 11/09/2019  WBC 4.0 - 10.5 K/uL 3.8(L) 4.5 4.6  Hemoglobin 12.0 - 15.0 g/dL 11.8(L) 11.4(L) 12.0  Hematocrit 36 - 46 % 38.9 36.7 38.8  Platelets 150 - 400 K/uL 205 230 235    . CMP Latest Ref Rng & Units 03/21/2020 01/11/2020 11/09/2019  Glucose 70 - 99 mg/dL 81 83 81  BUN 8 - 23 mg/dL 21 25(H) 22  Creatinine 0.44 - 1.00 mg/dL 1.07(H) 1.13(H) 1.02(H)  Sodium 135 - 145 mmol/L 137 140 138  Potassium 3.5 - 5.1 mmol/L 3.8 3.8 4.0  Chloride 98 - 111 mmol/L 102 104 102  CO2 22 - 32 mmol/L 28 29 27   Calcium 8.9 - 10.3 mg/dL 10.2 9.5 10.1  Total Protein 6.5 - 8.1 g/dL 11.0(H) 10.9(H) 10.3(H)  Total Bilirubin 0.3 - 1.2 mg/dL 0.4 0.2(L) 0.3  Alkaline Phos 38 - 126 U/L 53 58 58  AST 15 - 41 U/L 16 15 14(L)  ALT 0 - 44 U/L 10 9 9    Component     Latest Ref Rng & Units 06/21/2019 07/28/2019 08/01/2019  Total Protein, Urine-UPE24     Not Estab. mg/dL   8.2  Total Protein, Urine-Ur/day     30 - 150 mg/24 hr   180 (H)  ALBUMIN, U     %   19.7  ALPHA 1 URINE     %   1.5  Alpha 2, Urine     %   6.0  % BETA, Urine      %   21.4  GAMMA GLOBULIN URINE     %   51.4  Free Kappa Lt Chains,Ur     0.63 - 113.79 mg/L   383.74 (H)  Free Lambda Lt Chains,Ur     0.47 - 11.77 mg/L   0.68  Free Kappa/Lambda Ratio     1.03 - 31.76   564.32 (H)  Immunofixation Result, Urine        Comment  Total Volume        2,200  M-SPIKE %, Urine     Not Observed %   28.3 (H)  M-Spike, mg/24 hr     Not Observed mg/24 hr   51 (H)  NOTE:        Comment  IgG (Immunoglobin G), Serum     586 - 1,602 mg/dL  5,351 (H)   IgA     64 - 422 mg/dL  99   IgM (Immunoglobulin M), Srm     26 - 217 mg/dL  15 (L)   Total Protein ELP     6.0 - 8.5 g/dL  10.2 (H)   Albumin SerPl Elph-Mcnc     2.9 - 4.4 g/dL  4.1   Alpha 1     0.0 - 0.4 g/dL  0.3   Alpha2 Glob SerPl Elph-Mcnc     0.4 - 1.0 g/dL  0.8   B-Globulin SerPl Elph-Mcnc     0.7 - 1.3 g/dL  1.1   Gamma Glob SerPl Elph-Mcnc     0.4 - 1.8 g/dL  3.9 (H)   M Protein SerPl Elph-Mcnc     Not Observed g/dL  3.4 (H)   Globulin, Total     2.2 -  3.9 g/dL  6.1 (H)   Albumin/Glob SerPl     0.7 - 1.7  0.7   IFE 1       Comment   Please Note (HCV):       Comment   Creatinine, Urine     20 - 275 mg/dL 139    Protein/Creat Ratio     21 - 161 mg/g creat 209 (H)    Protein/Creatinine Ratio     0.021 - 0.16 mg/mg creat 0.209 (H)    Total Protein, Urine     5 - 24 mg/dL 29 (H)    Albumin     % 44    Alpha-1-Globulin, U     % 3    ALPHA-2-GLOBULIN, U     % 12    Beta Globulin, U     % 9    Gamma Globulin, U     % 33    Abnormal Protein Band     NONE DETEC mg/dL 7 (H)    Abnormal Protein Band     NONE DETEC mg/dL 1 (H)    Interpretation          Iron     41 - 142 ug/dL  39 (L)   TIBC     236 - 444 ug/dL  248   Saturation Ratios     21 - 57 %  16 (L)   UIBC     120 - 384 ug/dL  208   Kappa free light chain     3.3 - 19.4 mg/L  412.0 (H)   Lamda free light chains     5.7 - 26.3 mg/L  8.0   Kappa, lamda light chain ratio     0.26 - 1.65  51.50 (H)   Sed Rate      0 - 22 mm/hr  110 (H)   LDH     98 - 192 U/L  135   Ferritin     11 - 307 ng/mL  154   Vitamin B12     180 - 914 pg/mL  709     09/02/2019 Bone Marrow Biopsy ([id number])   08/05/2019 Bone Marrow Biopsy ([id number])        RADIOGRAPHIC STUDIES: I have personally reviewed the radiological images as listed and agreed with the findings in the report. No results found.  ASSESSMENT & PLAN:  EMMIE FRAKES is a 86 y.o. female with:  #1 IgG Kappa Monoclonal paraproteinemia with M protein of 3.9 -highly concerning for MM  06/14/2019 SPEP revealed total protein at 10.1, alpha 2 at 1.0, gamma globulin at 4.1, Abnormal protein band1 at 3.9 UPEP +ve for Bence Jones protein  08/08/2019 PET scan with results revealing "Two right thyroid nodules are both highly hypermetabolic. A significant minority of hypermetabolic thyroid lesions represent thyroid malignancy. Further workup with thyroid ultrasound is recommended. A small left upper para-aortic lymph node is mildly hypermetabolic with maximum SUV 4.7, significance uncertain. No significant abnormal accentuated osseous activity or compelling focal findings of active multiple myeloma in the skeleton. Hypodense lesions in the liver are photopenic and likely benign Hyperdense small lesions of the left kidney upper pole are too small to characterize but statistically most likely to be complex cysts. If the patient has hematuria or if otherwise clinically warranted, renal protocol MRI with and without contrast could be utilized for further characterization. Mild mid to distal accentuated thoracic esophageal activity with maximum SUV of 4.8; mildly accentuated activity in the gastric  cardia/gastroesophageal junction with maximum SUV 5.8. Both could be physiologic but rarely low-grade malignancy could have a similar appearance, upper endoscopy could be utilized for further workup if clinically warranted. Other imaging findings of potential clinical  significance: Chronic ischemic microvascular white matter disease intracranially. Aortic Atherosclerosis (ICD10-I70.0). Mild cardiomegaly. Biapical pleuroparenchymal scarring and probable scarring in the superior segment left lower lobe. Calcified uterine fibroids. Thoracolumbar scoliosis. No clear bone lesions."  08/05/2019 bone marrow biopsy ([id number]) with results revealing "no monoclonal b cell population or abnormal T cell phenotype identified. Atypical lymphoplasmacytoid aggregates - not clear for myeloma  09/02/2019 bone marrow biopsy ([id number]) with results revealing small popoulation of monoclonal plasma cell. Does not appear to co-related with M protein levels and seem to be underrepresented.  #2. FDG avid thyroid nodules  #3.  Patient Active Problem List   Diagnosis Date Noted  . Depression, major, single episode, complete remission ([place]) 02/13/2020  . Smoldering myeloma ([place]) 10/17/2019  . Vitamin D deficiency 10/17/2019  . Dementia without behavioral disturbance ([place]) 07/01/2017  . Mixed hyperlipidemia 11/29/2013  . Osteoporosis 03/15/2013  . BRADYCARDIA 02/17/2009  . HYPERTENSION, BENIGN ESSENTIAL 02/16/2009  . ANEMIA, HYPOCHROMIC 07/26/2007  . DISORDER, TOBACCO USE 07/26/2007  . DEPRESSION 07/26/2007  . CARDIAC CATHETERIZATION, LEFT, HX OF 07/26/2007    PLAN: -No clinical evidence of Smoldering Myeloma progression  at this time.  -Advised pt that her symptoms of fatigue may be from Multiple Myeloma, but no clear causal relationship.  -Advised pt that de-conditioning can contribute to her fatigue.  -Advised pt that if labs today remain steady will move f/u out to every 4 months  -Recommend pt continue to f/u with Sherrie Mustache - NP for Osteoporosis management.  -Will get labs today  -Will see back in 4 months, with labs 1 week prior   FOLLOW UP: Labs today Labs in 15 weeks RTC with Dr Irene Limbo in 16 weeks  . Orders Placed This Encounter  Procedures  . CBC  with Differential/Platelet    Standing Status:   Future    Number of Occurrences:   1    Standing Expiration Date:   06/20/2021  . CMP ([place] only)    Standing Status:   Future    Number of Occurrences:   1    Standing Expiration Date:   06/20/2021  . Multiple Myeloma Panel (SPEP&IFE w/QIG)    Standing Status:   Future    Number of Occurrences:   1    Standing Expiration Date:   06/20/2021  . Vitamin D 25 hydroxy    Standing Status:   Future    Number of Occurrences:   1    Standing Expiration Date:   06/20/2021  . CBC with Differential/Platelet    Standing Status:   Future    Standing Expiration Date:   06/20/2021  . CMP ([place] only)    Standing Status:   Future    Standing Expiration Date:   06/20/2021  . Multiple Myeloma Panel (SPEP&IFE w/QIG)    Standing Status:   Future    Standing Expiration Date:   06/20/2021      The total time spent in the appt was 20 minutes and more than 50% was on counseling and direct patient cares.  All of the patient's questions were answered with apparent satisfaction. The patient knows to call the clinic with any problems, questions or concerns.   Sullivan Lone MD [place] AAHIVMS [facility] Hematology/Oncology Physician [place]  (Office):  [phone] (Work cell):  [phone] (Fax):           [phone]  06/20/2020 7:16 AM  I, Yevette Edwards, am acting as a scribe for Dr. Sullivan Lone.   .I have reviewed the above documentation for accuracy and completeness, and I agree with the above. Brunetta Genera MD     ADDENDUM  . CBC Latest Ref Rng & Units 06/20/2020 03/21/2020 01/11/2020  WBC 4.0 - 10.5 K/uL 4.4 3.8(L) 4.5  Hemoglobin 12.0 - 15.0 g/dL 11.1(L) 11.8(L) 11.4(L)  Hematocrit 36 - 46 % 35.3(L) 38.9 36.7  Platelets 150 - 400 K/uL 205 205 230   . CMP Latest Ref Rng & Units 06/20/2020 03/21/2020 01/11/2020  Glucose 70 - 99 mg/dL 84 81 83  BUN 8 - 23 mg/dL 25(H) 21 25(H)  Creatinine 0.44 - 1.00 mg/dL  1.11(H) 1.07(H) 1.13(H)  Sodium 135 - 145 mmol/L 137 137 140  Potassium 3.5 - 5.1 mmol/L 3.6 3.8 3.8  Chloride 98 - 111 mmol/L 103 102 104  CO2 22 - 32 mmol/L 28 28 29   Calcium 8.9 - 10.3 mg/dL 10.6(H) 10.2 9.5  Total Protein 6.5 - 8.1 g/dL 10.7(H) 11.0(H) 10.9(H)  Total Bilirubin 0.3 - 1.2 mg/dL 0.2(L) 0.4 0.2(L)  Alkaline Phos 38 - 126 U/L 52 53 58  AST 15 - 41 U/L 16 16 15   ALT 0 - 44 U/L 8 10 9    Component     Latest Ref Rng & Units 06/20/2020  IgG (Immunoglobin G), Serum     586 - 1,602 mg/dL 4,926 (H)  IgA     64 - 422 mg/dL 88  IgM (Immunoglobulin M), Srm     26 - 217 mg/dL 12 (L)  Total Protein ELP     6.0 - 8.5 g/dL 9.9 (H)  Albumin SerPl Elph-Mcnc     2.9 - 4.4 g/dL 4.0  Alpha 1     0.0 - 0.4 g/dL 0.3  Alpha2 Glob SerPl Elph-Mcnc     0.4 - 1.0 g/dL 0.7  B-Globulin SerPl Elph-Mcnc     0.7 - 1.3 g/dL 0.9  Gamma Glob SerPl Elph-Mcnc     0.4 - 1.8 g/dL 4.0 (H)  M Protein SerPl Elph-Mcnc     Not Observed g/dL 3.6 (H)  Globulin, Total     2.2 - 3.9 g/dL 5.9 (H)  Albumin/Glob SerPl     0.7 - 1.7 0.7  IFE 1      Comment (A)  Please Note (HCV):      Comment  Vitamin D, 25-Hydroxy     30 - 100 ng/mL 129.02 (H)   PLAN - labs stable -Myeloma panel M spike stable at 3.6

## 2020-06-21 ENCOUNTER — Telehealth: Payer: Self-pay | Admitting: Hematology

## 2020-06-21 NOTE — Telephone Encounter (Signed)
Scheduled per 07/14 los, patient has been called and notified. 

## 2020-06-22 ENCOUNTER — Other Ambulatory Visit: Payer: Self-pay | Admitting: Nurse Practitioner

## 2020-06-22 DIAGNOSIS — F0390 Unspecified dementia without behavioral disturbance: Secondary | ICD-10-CM

## 2020-06-25 LAB — MULTIPLE MYELOMA PANEL, SERUM
Albumin SerPl Elph-Mcnc: 4 g/dL (ref 2.9–4.4)
Albumin/Glob SerPl: 0.7 (ref 0.7–1.7)
Alpha 1: 0.3 g/dL (ref 0.0–0.4)
Alpha2 Glob SerPl Elph-Mcnc: 0.7 g/dL (ref 0.4–1.0)
B-Globulin SerPl Elph-Mcnc: 0.9 g/dL (ref 0.7–1.3)
Gamma Glob SerPl Elph-Mcnc: 4 g/dL — ABNORMAL HIGH (ref 0.4–1.8)
Globulin, Total: 5.9 g/dL — ABNORMAL HIGH (ref 2.2–3.9)
IgA: 88 mg/dL (ref 64–422)
IgG (Immunoglobin G), Serum: 4926 mg/dL — ABNORMAL HIGH (ref 586–1602)
IgM (Immunoglobulin M), Srm: 12 mg/dL — ABNORMAL LOW (ref 26–217)
M Protein SerPl Elph-Mcnc: 3.6 g/dL — ABNORMAL HIGH
Total Protein ELP: 9.9 g/dL — ABNORMAL HIGH (ref 6.0–8.5)

## 2020-06-25 NOTE — Telephone Encounter (Signed)
rx sent to pharmacy by e-script  

## 2020-07-02 ENCOUNTER — Telehealth: Payer: Self-pay | Admitting: *Deleted

## 2020-07-02 NOTE — Telephone Encounter (Signed)
-----   Message from Brunetta Genera, MD sent at 06/26/2020 11:44 PM EDT ----- Plz let patient know her Myeloma labs are stable. thx

## 2020-07-02 NOTE — Telephone Encounter (Signed)
Contacted patient regarding test results per Dr. Grier Mitts directions in message below. Patient verbalized understanding.

## 2020-07-15 ENCOUNTER — Other Ambulatory Visit: Payer: Self-pay | Admitting: Nurse Practitioner

## 2020-07-15 DIAGNOSIS — E782 Mixed hyperlipidemia: Secondary | ICD-10-CM

## 2020-08-16 ENCOUNTER — Telehealth: Payer: Self-pay | Admitting: Nurse Practitioner

## 2020-08-16 NOTE — Telephone Encounter (Signed)
Pt is requesting form thru Medicaid to get assistance for aide to come help with light cleaning such as bathrooms & vaccuming that is hard for her.  Her family comes over & takes care of trash & bringing meals. She is able to bath herself for now.  I printed the form & left it on your desk.  Thanks,  Kathyrn Lass

## 2020-08-17 NOTE — Telephone Encounter (Signed)
Received P[employer] ([id number]) form.  Filled out and placed in [place] folder for Janett Billow to review and sign.  To be faxed to [facility] Fax: [phone]

## 2020-08-20 NOTE — Telephone Encounter (Signed)
Completed form and given back to [place]

## 2020-08-20 NOTE — Telephone Encounter (Signed)
Given to [place] on Friday.

## 2020-08-28 ENCOUNTER — Other Ambulatory Visit: Payer: Self-pay | Admitting: Nurse Practitioner

## 2020-08-28 DIAGNOSIS — I10 Essential (primary) hypertension: Secondary | ICD-10-CM

## 2020-09-18 NOTE — Telephone Encounter (Signed)
Opened by accident. Please disregard.  °

## 2020-09-28 ENCOUNTER — Other Ambulatory Visit: Payer: Self-pay | Admitting: Internal Medicine

## 2020-09-28 DIAGNOSIS — F0390 Unspecified dementia without behavioral disturbance: Secondary | ICD-10-CM

## 2020-10-03 ENCOUNTER — Other Ambulatory Visit: Payer: Self-pay

## 2020-10-03 ENCOUNTER — Inpatient Hospital Stay: Payer: Medicare Other | Attending: Hematology

## 2020-10-03 DIAGNOSIS — D472 Monoclonal gammopathy: Secondary | ICD-10-CM

## 2020-10-03 DIAGNOSIS — E559 Vitamin D deficiency, unspecified: Secondary | ICD-10-CM

## 2020-10-03 DIAGNOSIS — C9000 Multiple myeloma not having achieved remission: Secondary | ICD-10-CM | POA: Diagnosis not present

## 2020-10-03 LAB — CBC WITH DIFFERENTIAL/PLATELET
Abs Immature Granulocytes: 0.01 10*3/uL (ref 0.00–0.07)
Basophils Absolute: 0 10*3/uL (ref 0.0–0.1)
Basophils Relative: 1 %
Eosinophils Absolute: 0 10*3/uL (ref 0.0–0.5)
Eosinophils Relative: 1 %
HCT: 36.7 % (ref 36.0–46.0)
Hemoglobin: 11.4 g/dL — ABNORMAL LOW (ref 12.0–15.0)
Immature Granulocytes: 0 %
Lymphocytes Relative: 30 %
Lymphs Abs: 1.2 10*3/uL (ref 0.7–4.0)
MCH: 26.9 pg (ref 26.0–34.0)
MCHC: 31.1 g/dL (ref 30.0–36.0)
MCV: 86.6 fL (ref 80.0–100.0)
Monocytes Absolute: 0.5 10*3/uL (ref 0.1–1.0)
Monocytes Relative: 12 %
Neutro Abs: 2.3 10*3/uL (ref 1.7–7.7)
Neutrophils Relative %: 56 %
Platelets: 215 10*3/uL (ref 150–400)
RBC: 4.24 MIL/uL (ref 3.87–5.11)
RDW: 14.2 % (ref 11.5–15.5)
WBC: 4 10*3/uL (ref 4.0–10.5)
nRBC: 0 % (ref 0.0–0.2)

## 2020-10-03 LAB — CMP (CANCER CENTER ONLY)
ALT: 8 U/L (ref 0–44)
AST: 15 U/L (ref 15–41)
Albumin: 3.4 g/dL — ABNORMAL LOW (ref 3.5–5.0)
Alkaline Phosphatase: 54 U/L (ref 38–126)
Anion gap: 7 (ref 5–15)
BUN: 23 mg/dL (ref 8–23)
CO2: 26 mmol/L (ref 22–32)
Calcium: 10.5 mg/dL — ABNORMAL HIGH (ref 8.9–10.3)
Chloride: 104 mmol/L (ref 98–111)
Creatinine: 1.01 mg/dL — ABNORMAL HIGH (ref 0.44–1.00)
GFR, Estimated: 54 mL/min — ABNORMAL LOW (ref >60)
Glucose, Bld: 89 mg/dL (ref 70–99)
Potassium: 3.9 mmol/L (ref 3.5–5.1)
Sodium: 137 mmol/L (ref 135–145)
Total Bilirubin: 0.3 mg/dL (ref 0.3–1.2)
Total Protein: 11.1 g/dL — ABNORMAL HIGH (ref 6.5–8.1)

## 2020-10-05 LAB — MULTIPLE MYELOMA PANEL, SERUM
Albumin SerPl Elph-Mcnc: 4 g/dL (ref 2.9–4.4)
Albumin/Glob SerPl: 0.7 (ref 0.7–1.7)
Alpha 1: 0.3 g/dL (ref 0.0–0.4)
Alpha2 Glob SerPl Elph-Mcnc: 0.9 g/dL (ref 0.4–1.0)
B-Globulin SerPl Elph-Mcnc: 1.1 g/dL (ref 0.7–1.3)
Gamma Glob SerPl Elph-Mcnc: 3.9 g/dL — ABNORMAL HIGH (ref 0.4–1.8)
Globulin, Total: 6.2 g/dL — ABNORMAL HIGH (ref 2.2–3.9)
IgA: 90 mg/dL (ref 64–422)
IgG (Immunoglobin G), Serum: 5444 mg/dL — ABNORMAL HIGH (ref 586–1602)
IgM (Immunoglobulin M), Srm: 13 mg/dL — ABNORMAL LOW (ref 26–217)
M Protein SerPl Elph-Mcnc: 3.6 g/dL — ABNORMAL HIGH
Total Protein ELP: 10.2 g/dL — ABNORMAL HIGH (ref 6.0–8.5)

## 2020-10-06 ENCOUNTER — Ambulatory Visit: Payer: Medicare Other | Attending: Internal Medicine

## 2020-10-06 DIAGNOSIS — Z23 Encounter for immunization: Secondary | ICD-10-CM

## 2020-10-06 NOTE — Progress Notes (Signed)
   Covid-19 Vaccination Clinic  Name:  Florinda Taflinger    MRN: [id number] DOB: September 06, 1931  10/06/2020  Mr. Crager was observed post Covid-19 immunization for 15 minutes without incident. He was provided with Vaccine Information Sheet and instruction to access the V-Safe system.   Mr. Bultman was instructed to call 911 with any severe reactions post vaccine: Marland Kitchen Difficulty breathing  . Swelling of face and throat  . A fast heartbeat  . A bad rash all over body  . Dizziness and weakness

## 2020-10-10 ENCOUNTER — Ambulatory Visit: Payer: Medicare Other | Admitting: Hematology

## 2020-10-10 ENCOUNTER — Other Ambulatory Visit: Payer: Self-pay

## 2020-10-10 ENCOUNTER — Inpatient Hospital Stay: Payer: Medicare Other | Attending: Hematology | Admitting: Hematology

## 2020-10-10 VITALS — BP 151/73 | HR 50 | Temp 96.6°F | Resp 18 | Ht 67.0 in | Wt 144.5 lb

## 2020-10-10 DIAGNOSIS — C9000 Multiple myeloma not having achieved remission: Secondary | ICD-10-CM

## 2020-10-10 DIAGNOSIS — E559 Vitamin D deficiency, unspecified: Secondary | ICD-10-CM | POA: Diagnosis not present

## 2020-10-10 DIAGNOSIS — E041 Nontoxic single thyroid nodule: Secondary | ICD-10-CM | POA: Insufficient documentation

## 2020-10-10 DIAGNOSIS — D472 Monoclonal gammopathy: Secondary | ICD-10-CM

## 2020-10-10 NOTE — Progress Notes (Signed)
HEMATOLOGY/ONCOLOGY NOTE  Date of Service: 10/10/2020  Patient Care Team: Lauree Chandler, NP as PCP - General (Geriatric Medicine)   CHIEF COMPLAINTS:  F/u for smoldering myeloma   HISTORY OF PRESENTING ILLNESS:  Jasmine Fletcher is a wonderful 86 y.o. female who has been referred to [place] by Lauree Chandler, NP for evaluation and management of elevated serum globulin levels. The pt reports that she is doing well overall.  The pt reports that her appetite has decreased recently but that she eats a healthy diet. She notes mild fatigue. Denies new bone pain, back pain, hip pain, infection concerns, and any other new concerns.  She has been getting a Prolia shot every 6 months for the past 2 years. She has also been taking Aricept but notes that her memory is worsening. Her daughter is her health care POA, but the pt makes her own healthcare decisions at this time.  Most recent lab results (06/14/2019) of CBC is as follows: all values are WNL except for HGB at 11.4, MCH at 26.8. 06/21/2019 UPEP revealed protein/creat ratio at 209, total protein at 29, and two abnormal protein bands 06/14/2019 SPEP revealed total protein at 10.1, Alpha 2 at 1.0, gamma globulin at 4.1, abnormal protein band1 at 3.9  On review of systems, pt reports some fatigue, decreased appetite, and denies new bone pain, back pain, hip pain, infection concerns, and any other symptoms.   On PMHx the pt reports that she is taking BP medication  On Social Hx the pt reports that she quit smoking in 2004 and does not drink alcohol   INTERVAL HISTORY: Jasmine Fletcher is a 86 y.o. female here for evaluation and management of plasma cell dyscrasia. We are joined today by her daughter. The patient's last visit with [place] was on 06/20/2020. The pt reports that she is doing well overall.  The pt reports a loss of energy since our last visit. Pt is becoming more tired, or tired more quickly while doing her routine house  chores. Her daughter feels that the increase in fatigue is due, in part, to pt's lack of activity in the pandemic. She notes a regular low-level headache. Pt feels that she is eating and hydrating well, but has lost four pounds since our last visit.   Pt is currently taking weekly Ergocalciferol and is receiving Prolia injections with her PCP to treat her Osteoporosis. She has received her COVID19 booster.  Lab results today (10/03/2020) of CBC w/diff and CMP is as follows: all values are WNL except for Hgb at 11.4, Creatinine at 1.01, Calcium at 10.5, Total Protein at 11.1, Albumin at 3.4, GFR Est at 54. 10/03/2020 MMP shows all values are WNL except for: IgG at 5444, IgM at 13, Total Protein at 10.2, Gamma Glob at 3.9, M Protein at 3.6, Total Globulin at 6.2.  On review of systems, pt reports headache, fatigue and denies bone pain, back pain, low appetite, bowel habit changes, urinary habit changes, SOB and any other symptoms.   MEDICAL HISTORY:  Past Medical History:  Diagnosis Date  . Hyperlipidemia   . Hypertension   . Osteoporosis     SURGICAL HISTORY: Past Surgical History:  Procedure Laterality Date  . ABDOMINAL HYSTERECTOMY  1998    SOCIAL HISTORY: Social History   Socioeconomic History  . Marital status: Single    Spouse name: Not on file  . Number of children: Not on file  . Years of education: Not on file  .  Highest education level: Not on file  Occupational History  . Not on file  Tobacco Use  . Smoking status: Former Smoker    Packs/day: 1.00    Quit date: 12/08/2002    Years since quitting: 17.8  . Smokeless tobacco: Never Used  . Tobacco comment: Quit t age 76   Vaping Use  . Vaping Use: Never used  Substance and Sexual Activity  . Alcohol use: No    Alcohol/week: 0.0 standard drinks  . Drug use: No  . Sexual activity: Not on file  Other Topics Concern  . Not on file  Social History Narrative  . Not on file   Social Determinants of Health    Financial Resource Strain:   . Difficulty of Paying Living Expenses: Not on file  Food Insecurity:   . Worried About Charity fundraiser in the Last Year: Not on file  . Ran Out of Food in the Last Year: Not on file  Transportation Needs:   . Lack of Transportation (Medical): Not on file  . Lack of Transportation (Non-Medical): Not on file  Physical Activity:   . Days of Exercise per Week: Not on file  . Minutes of Exercise per Session: Not on file  Stress:   . Feeling of Stress : Not on file  Social Connections:   . Frequency of Communication with Friends and Family: Not on file  . Frequency of Social Gatherings with Friends and Family: Not on file  . Attends Religious Services: Not on file  . Active Member of Clubs or Organizations: Not on file  . Attends Archivist Meetings: Not on file  . Marital Status: Not on file  Intimate Partner Violence:   . Fear of Current or Ex-Partner: Not on file  . Emotionally Abused: Not on file  . Physically Abused: Not on file  . Sexually Abused: Not on file    FAMILY HISTORY: Family History  Problem Relation Age of Onset  . Cancer Father   . Dementia Sister   . Breast cancer Maternal Grandmother   . Breast cancer Paternal Grandmother     ALLERGIES:  has No Known Allergies.  MEDICATIONS:  Current Outpatient Medications  Medication Sig Dispense Refill  . aspirin 81 MG tablet Take 81 mg by mouth daily.    Marland Kitchen denosumab (PROLIA) 60 MG/ML SOSY injection Inject 60 mg into the skin every 6 (six) months. 1 mL 0  . donepezil (ARICEPT) 5 MG tablet TAKE 1 TABLET(5 MG) BY MOUTH DAILY 90 tablet 0  . feeding supplement (BOOST HIGH PROTEIN) LIQD Take 1 Container by mouth daily.    . hydrochlorothiazide (HYDRODIURIL) 25 MG tablet TAKE 1 TABLET(25 MG) BY MOUTH DAILY 90 tablet 1  . lisinopril (ZESTRIL) 5 MG tablet TAKE 1 TABLET(5 MG) BY MOUTH DAILY 90 tablet 1  . simvastatin (ZOCOR) 20 MG tablet TAKE 1 TABLET BY MOUTH EVERY DAY 90 tablet 1   . Vitamin D, Ergocalciferol, (DRISDOL) 1.25 MG (50000 UNIT) CAPS capsule TAKE 1 CAPSULE BY MOUTH 1 TIME A WEEK 12 capsule 2   No current facility-administered medications for this visit.    REVIEW OF SYSTEMS:   A 10+ POINT REVIEW OF SYSTEMS WAS OBTAINED including neurology, dermatology, psychiatry, cardiac, respiratory, lymph, extremities, GI, GU, Musculoskeletal, constitutional, breasts, reproductive, HEENT.  All pertinent positives are noted in the HPI.  All others are negative.   PHYSICAL EXAMINATION: Vitals:   10/10/20 1002  BP: (!) 151/73  Pulse: (!) 50  Resp:  18  Temp: (!) 96.6 F (35.9 C)  SpO2: 98%   Wt Readings from Last 3 Encounters:  10/10/20 144 lb 8 oz (65.5 kg)  06/20/20 148 lb 6.4 oz (67.3 kg)  06/13/20 148 lb 9.6 oz (67.4 kg)   Body mass index is 22.63 kg/m.    ECOG FS:2 - Symptomatic, <50% confined to bed   Exam was given in a chair   GENERAL:alert, in no acute distress and comfortable SKIN: no acute rashes, no significant lesions EYES: conjunctiva are pink and non-injected, sclera anicteric OROPHARYNX: MMM, no exudates, no oropharyngeal erythema or ulceration NECK: supple, no JVD LYMPH:  no palpable lymphadenopathy in the cervical, axillary or inguinal regions LUNGS: clear to auscultation b/l with normal respiratory effort HEART: regular rate & rhythm ABDOMEN:  normoactive bowel sounds , non tender, not distended. No palpable hepatosplenomegaly.  Extremity: no pedal edema PSYCH: alert & oriented x 3 with fluent speech NEURO: no focal motor/sensory deficits  LABORATORY DATA:  I have reviewed the data as listed  . CBC Latest Ref Rng & Units 10/03/2020 06/20/2020 03/21/2020  WBC 4.0 - 10.5 K/uL 4.0 4.4 3.8(L)  Hemoglobin 12.0 - 15.0 g/dL 11.4(L) 11.1(L) 11.8(L)  Hematocrit 36 - 46 % 36.7 35.3(L) 38.9  Platelets 150 - 400 K/uL 215 205 205    . CMP Latest Ref Rng & Units 10/03/2020 06/20/2020 03/21/2020  Glucose 70 - 99 mg/dL 89 84 81  BUN 8 - 23  mg/dL 23 25(H) 21  Creatinine 0.44 - 1.00 mg/dL 1.01(H) 1.11(H) 1.07(H)  Sodium 135 - 145 mmol/L 137 137 137  Potassium 3.5 - 5.1 mmol/L 3.9 3.6 3.8  Chloride 98 - 111 mmol/L 104 103 102  CO2 22 - 32 mmol/L 26 28 28   Calcium 8.9 - 10.3 mg/dL 10.5(H) 10.6(H) 10.2  Total Protein 6.5 - 8.1 g/dL 11.1(H) 10.7(H) 11.0(H)  Total Bilirubin 0.3 - 1.2 mg/dL 0.3 0.2(L) 0.4  Alkaline Phos 38 - 126 U/L 54 52 53  AST 15 - 41 U/L 15 16 16   ALT 0 - 44 U/L 8 8 10    Component     Latest Ref Rng & Units 06/21/2019 07/28/2019 08/01/2019  Total Protein, Urine-UPE24     Not Estab. mg/dL   8.2  Total Protein, Urine-Ur/day     30 - 150 mg/24 hr   180 (H)  ALBUMIN, U     %   19.7  ALPHA 1 URINE     %   1.5  Alpha 2, Urine     %   6.0  % BETA, Urine     %   21.4  GAMMA GLOBULIN URINE     %   51.4  Free Kappa Lt Chains,Ur     0.63 - 113.79 mg/L   383.74 (H)  Free Lambda Lt Chains,Ur     0.47 - 11.77 mg/L   0.68  Free Kappa/Lambda Ratio     1.03 - 31.76   564.32 (H)  Immunofixation Result, Urine        Comment  Total Volume        2,200  M-SPIKE %, Urine     Not Observed %   28.3 (H)  M-Spike, mg/24 hr     Not Observed mg/24 hr   51 (H)  NOTE:        Comment  IgG (Immunoglobin G), Serum     586 - 1,602 mg/dL  5,351 (H)   IgA     64 - 422  mg/dL  99   IgM (Immunoglobulin M), Srm     26 - 217 mg/dL  15 (L)   Total Protein ELP     6.0 - 8.5 g/dL  10.2 (H)   Albumin SerPl Elph-Mcnc     2.9 - 4.4 g/dL  4.1   Alpha 1     0.0 - 0.4 g/dL  0.3   Alpha2 Glob SerPl Elph-Mcnc     0.4 - 1.0 g/dL  0.8   B-Globulin SerPl Elph-Mcnc     0.7 - 1.3 g/dL  1.1   Gamma Glob SerPl Elph-Mcnc     0.4 - 1.8 g/dL  3.9 (H)   M Protein SerPl Elph-Mcnc     Not Observed g/dL  3.4 (H)   Globulin, Total     2.2 - 3.9 g/dL  6.1 (H)   Albumin/Glob SerPl     0.7 - 1.7  0.7   IFE 1       Comment   Please Note (HCV):       Comment   Creatinine, Urine     20 - 275 mg/dL 139    Protein/Creat Ratio     21 - 161  mg/g creat 209 (H)    Protein/Creatinine Ratio     0.021 - 0.16 mg/mg creat 0.209 (H)    Total Protein, Urine     5 - 24 mg/dL 29 (H)    Albumin     % 44    Alpha-1-Globulin, U     % 3    ALPHA-2-GLOBULIN, U     % 12    Beta Globulin, U     % 9    Gamma Globulin, U     % 33    Abnormal Protein Band     NONE DETEC mg/dL 7 (H)    Abnormal Protein Band     NONE DETEC mg/dL 1 (H)    Interpretation          Iron     41 - 142 ug/dL  39 (L)   TIBC     236 - 444 ug/dL  248   Saturation Ratios     21 - 57 %  16 (L)   UIBC     120 - 384 ug/dL  208   Kappa free light chain     3.3 - 19.4 mg/L  412.0 (H)   Lamda free light chains     5.7 - 26.3 mg/L  8.0   Kappa, lamda light chain ratio     0.26 - 1.65  51.50 (H)   Sed Rate     0 - 22 mm/hr  110 (H)   LDH     98 - 192 U/L  135   Ferritin     11 - 307 ng/mL  154   Vitamin B12     180 - 914 pg/mL  709     09/02/2019 Bone Marrow Biopsy ([id number])   08/05/2019 Bone Marrow Biopsy ([id number])        RADIOGRAPHIC STUDIES: I have personally reviewed the radiological images as listed and agreed with the findings in the report. No results found.  ASSESSMENT & PLAN:  DANYLLE OUK is a 86 y.o. female with:  #1 IgG Kappa Monoclonal paraproteinemia with M protein of 3.9 -highly concerning for MM  06/14/2019 SPEP revealed total protein at 10.1, alpha 2 at 1.0, gamma globulin at 4.1, Abnormal protein band1 at 3.9 UPEP +ve for Bence Jones protein  08/08/2019 PET scan with results revealing "Two right thyroid nodules are both highly hypermetabolic. A significant minority of hypermetabolic thyroid lesions represent thyroid malignancy. Further workup with thyroid ultrasound is recommended. A small left upper para-aortic lymph node is mildly hypermetabolic with maximum SUV 4.7, significance uncertain. No significant abnormal accentuated osseous activity or compelling focal findings of active multiple myeloma in the  skeleton. Hypodense lesions in the liver are photopenic and likely benign Hyperdense small lesions of the left kidney upper pole are too small to characterize but statistically most likely to be complex cysts. If the patient has hematuria or if otherwise clinically warranted, renal protocol MRI with and without contrast could be utilized for further characterization. Mild mid to distal accentuated thoracic esophageal activity with maximum SUV of 4.8; mildly accentuated activity in the gastric cardia/gastroesophageal junction with maximum SUV 5.8. Both could be physiologic but rarely low-grade malignancy could have a similar appearance, upper endoscopy could be utilized for further workup if clinically warranted. Other imaging findings of potential clinical significance: Chronic ischemic microvascular white matter disease intracranially. Aortic Atherosclerosis (ICD10-I70.0). Mild cardiomegaly. Biapical pleuroparenchymal scarring and probable scarring in the superior segment left lower lobe. Calcified uterine fibroids. Thoracolumbar scoliosis. No clear bone lesions."  08/05/2019 bone marrow biopsy ([id number]) with results revealing "no monoclonal b cell population or abnormal T cell phenotype identified. Atypical lymphoplasmacytoid aggregates - not clear for myeloma  09/02/2019 bone marrow biopsy ([id number]) with results revealing small popoulation of monoclonal plasma cell. Does not appear to co-related with M protein levels and seem to be underrepresented.  #2. FDG avid thyroid nodules  #3.  Patient Active Problem List   Diagnosis Date Noted  . Depression, major, single episode, complete remission ([place]) 02/13/2020  . Smoldering myeloma ([place]) 10/17/2019  . Vitamin D deficiency 10/17/2019  . Dementia without behavioral disturbance ([place]) 07/01/2017  . Mixed hyperlipidemia 11/29/2013  . Osteoporosis 03/15/2013  . BRADYCARDIA 02/17/2009  . HYPERTENSION, BENIGN ESSENTIAL 02/16/2009  . ANEMIA,  HYPOCHROMIC 07/26/2007  . DISORDER, TOBACCO USE 07/26/2007  . DEPRESSION 07/26/2007  . CARDIAC CATHETERIZATION, LEFT, HX OF 07/26/2007    PLAN: -Discussed pt labwork today, 10/03/20; Hgb is stable, borderline hypercalcemia, kidney function is stable, M Protein is stable at 3.6 g/dL -Advised pt that due to lack of relevant symptoms or worsening anemia, hypercalcemia, or kidney function this is still considered Smoldering Myeloma. No clinical evidence of Smoldering Myeloma progression at this time. -Advised pt that although her labs & symptoms are stable we could complete bone imaging and a repeat BM Bx to r/o progression.  -Pt would prefer to continue taking a conservative approach.  -Continue to f/u with Sherrie Mustache - NP for Osteoporosis management.   -Will get Bone survey and labs in 14 weeks -Will see back in 4 months   FOLLOW UP: Bone survey in 14 weeks Labs in 14 weeks RTC with Dr Irene Limbo in 16 weeks   The total time spent in the appt was 20 minutes and more than 50% was on counseling and direct patient cares.  All of the patient's questions were answered with apparent satisfaction. The patient knows to call the clinic with any problems, questions or concerns.   Sullivan Lone MD [place] AAHIVMS [facility] Hematology/Oncology Physician [facility]  (Office):       [phone] (Work cell):  [phone] (Fax):           [phone]  10/10/2020 10:32 AM  I, Yevette Edwards, am acting as a scribe for Dr. Suzan Slick  Milica Gully.   .I have reviewed the above documentation for accuracy and completeness, and I agree with the above. Brunetta Genera MD

## 2020-10-17 ENCOUNTER — Other Ambulatory Visit: Payer: Self-pay | Admitting: Nurse Practitioner

## 2020-10-17 DIAGNOSIS — E782 Mixed hyperlipidemia: Secondary | ICD-10-CM

## 2020-10-19 ENCOUNTER — Ambulatory Visit (INDEPENDENT_AMBULATORY_CARE_PROVIDER_SITE_OTHER): Payer: Medicare Other | Admitting: Nurse Practitioner

## 2020-10-19 ENCOUNTER — Encounter: Payer: Self-pay | Admitting: Nurse Practitioner

## 2020-10-19 ENCOUNTER — Other Ambulatory Visit: Payer: Self-pay

## 2020-10-19 VITALS — BP 120/80 | HR 58 | Temp 98.4°F | Resp 20 | Ht 67.0 in | Wt 144.8 lb

## 2020-10-19 DIAGNOSIS — I10 Essential (primary) hypertension: Secondary | ICD-10-CM | POA: Diagnosis not present

## 2020-10-19 DIAGNOSIS — E782 Mixed hyperlipidemia: Secondary | ICD-10-CM

## 2020-10-19 DIAGNOSIS — E559 Vitamin D deficiency, unspecified: Secondary | ICD-10-CM

## 2020-10-19 DIAGNOSIS — C9000 Multiple myeloma not having achieved remission: Secondary | ICD-10-CM

## 2020-10-19 DIAGNOSIS — H6122 Impacted cerumen, left ear: Secondary | ICD-10-CM

## 2020-10-19 DIAGNOSIS — R634 Abnormal weight loss: Secondary | ICD-10-CM

## 2020-10-19 DIAGNOSIS — M810 Age-related osteoporosis without current pathological fracture: Secondary | ICD-10-CM | POA: Diagnosis not present

## 2020-10-19 DIAGNOSIS — F0390 Unspecified dementia without behavioral disturbance: Secondary | ICD-10-CM

## 2020-10-19 DIAGNOSIS — Z23 Encounter for immunization: Secondary | ICD-10-CM

## 2020-10-19 DIAGNOSIS — D472 Monoclonal gammopathy: Secondary | ICD-10-CM

## 2020-10-19 NOTE — Patient Instructions (Signed)
Recommended protein supplement daily with smallest meal.

## 2020-10-19 NOTE — Progress Notes (Signed)
Careteam: Patient Care Team: Lauree Chandler, NP as PCP - General (Geriatric Medicine)  PLACE OF SERVICE:  Show [place] Directive information Does Patient Have a Medical Advance Directive?: Yes, Type of Advance Directive: Out of facility DNR (pink MOST or yellow form), Pre-existing out of facility DNR order (yellow form or pink MOST form): Yellow form placed in chart (order not valid for inpatient use);Pink MOST form placed in chart (order not valid for inpatient use), Does patient want to make changes to medical advance directive?: No - Patient declined  No Known Allergies  Chief Complaint  Patient presents with  . Medical Management of Chronic Issues    4 month follow-up. Flu vaccine today.      HPI: Patient is a 86 y.o. female for routine follow up.   Pt reports she is doing well.   Dementia- unchanged. Lives alone, daughter drives her to appt. And she has frequent visitors that check on her.  Does not cook like she used to but will go to get with her visitors. Reports eating 3 meals a day (small) and snacks. Reports she is not really hungry. Not very active.  Continues to keep up with the cleaning in her house however more difficult. Gets tired more easily.  Taking aricept 5 mg daily- when she went to 10 mg she was having bad dreams   Smoldering myeloma- followed by oncology/hematology.   htn- cont on lisinopril/hctz   Osteoporosis- due for prolia in January. Continues on vit d  Plans to get flu shot today   Review of Systems:  Review of Systems  Constitutional: Positive for weight loss. Negative for chills and fever.  HENT: Negative for tinnitus.   Respiratory: Negative for cough, sputum production and shortness of breath.   Cardiovascular: Negative for chest pain, palpitations and leg swelling.  Gastrointestinal: Negative for abdominal pain, constipation, diarrhea and heartburn.  Genitourinary: Negative for dysuria, frequency and urgency.    Musculoskeletal: Negative for back pain, falls, joint pain and myalgias.  Skin: Negative.   Neurological: Negative for dizziness and headaches.  Psychiatric/Behavioral: Positive for memory loss. Negative for depression. The patient does not have insomnia.     Past Medical History:  Diagnosis Date  . Hyperlipidemia   . Hypertension   . Osteoporosis    Past Surgical History:  Procedure Laterality Date  . ABDOMINAL HYSTERECTOMY  1998   Social History:   reports that she quit smoking about 17 years ago. She smoked 1.00 pack per day. She has never used smokeless tobacco. She reports that she does not drink alcohol and does not use drugs.  Family History  Problem Relation Age of Onset  . Cancer Father   . Dementia Sister   . Breast cancer Maternal Grandmother   . Breast cancer Paternal Grandmother     Medications: Patient's Medications  New Prescriptions   No medications on file  Previous Medications   ASPIRIN 81 MG TABLET    Take 81 mg by mouth daily.   DENOSUMAB (PROLIA) 60 MG/ML SOSY INJECTION    Inject 60 mg into the skin every 6 (six) months.   DONEPEZIL (ARICEPT) 5 MG TABLET    TAKE 1 TABLET(5 MG) BY MOUTH DAILY   FEEDING SUPPLEMENT (BOOST HIGH PROTEIN) LIQD    Take 1 Container by mouth daily.   HYDROCHLOROTHIAZIDE (HYDRODIURIL) 25 MG TABLET    TAKE 1 TABLET(25 MG) BY MOUTH DAILY   LISINOPRIL (ZESTRIL) 5 MG TABLET    TAKE 1  TABLET(5 MG) BY MOUTH DAILY   SIMVASTATIN (ZOCOR) 20 MG TABLET    TAKE 1 TABLET BY MOUTH EVERY DAY   VITAMIN D, ERGOCALCIFEROL, (DRISDOL) 1.25 MG (50000 UNIT) CAPS CAPSULE    TAKE 1 CAPSULE BY MOUTH 1 TIME A WEEK  Modified Medications   No medications on file  Discontinued Medications   No medications on file    Physical Exam:  Vitals:   10/19/20 1018  BP: 120/80  Pulse: (!) 58  Resp: 20  Temp: 98.4 F (36.9 C)  TempSrc: Temporal  SpO2: 96%  Weight: 144 lb 12.8 oz (65.7 kg)  Height: 5\' 7"  (1.702 m)   Body mass index is 22.68 kg/m. Wt  Readings from Last 3 Encounters:  10/19/20 144 lb 12.8 oz (65.7 kg)  10/10/20 144 lb 8 oz (65.5 kg)  06/20/20 148 lb 6.4 oz (67.3 kg)    Physical Exam Constitutional:      General: She is not in acute distress.    Appearance: She is well-developed. She is not diaphoretic.  HENT:     Head: Normocephalic and atraumatic.     Right Ear: External ear normal.     Left Ear: External ear normal. There is impacted cerumen.     Mouth/Throat:     Pharynx: No oropharyngeal exudate.  Eyes:     Conjunctiva/sclera: Conjunctivae normal.     Pupils: Pupils are equal, round, and reactive to light.  Cardiovascular:     Rate and Rhythm: Normal rate and regular rhythm.     Heart sounds: Normal heart sounds.  Pulmonary:     Effort: Pulmonary effort is normal.     Breath sounds: Normal breath sounds.  Abdominal:     General: Bowel sounds are normal.     Palpations: Abdomen is soft.  Musculoskeletal:        General: No tenderness.     Cervical back: Normal range of motion and neck supple.  Skin:    General: Skin is warm and dry.  Neurological:     Mental Status: She is alert and oriented to person, place, and time.     Labs reviewed: Basic Metabolic Panel: Recent Labs    01/11/20 1102 02/13/20 0948 03/21/20 1029 06/20/20 1036 10/03/20 0936  NA   < >  --  137 137 137  K   < >  --  3.8 3.6 3.9  CL   < >  --  102 103 104  CO2   < >  --  28 28 26   GLUCOSE   < >  --  81 84 89  BUN   < >  --  21 25* 23  CREATININE   < >  --  1.07* 1.11* 1.01*  CALCIUM   < >  --  10.2 10.6* 10.5*  TSH  --  5.85*  --   --   --    < > = values in this interval not displayed.   Liver Function Tests: Recent Labs    03/21/20 1029 06/20/20 1036 10/03/20 0936  AST 16 16 15   ALT 10 8 8   ALKPHOS 53 52 54  BILITOT 0.4 0.2* 0.3  PROT 11.0* 10.7* 11.1*  ALBUMIN 3.3* 3.3* 3.4*   No results for input(s): LIPASE, AMYLASE in the last 8760 hours. No results for input(s): AMMONIA in the last 8760  hours. CBC: Recent Labs    03/21/20 1029 06/20/20 1036 10/03/20 0936  WBC 3.8* 4.4 4.0  NEUTROABS 2.2 2.3 2.3  HGB 11.8* 11.1* 11.4*  HCT 38.9 35.3* 36.7  MCV 87.0 86.9 86.6  PLT 205 205 215   Lipid Panel: Recent Labs    06/13/20 1048  CHOL 152  HDL 42*  LDLCALC 89  TRIG 112  CHOLHDL 3.6   TSH: Recent Labs    02/13/20 0948  TSH 5.85*   A1C: No results found for: HGBA1C   Assessment/Plan 1. Osteoporosis, unspecified osteoporosis type, unspecified pathological fracture presence -continues on vit d with prolia every 6 months.   2. Vitamin D deficiency Continues on vit d 50,000 units weekly   3. Mixed hyperlipidemia -continues on zocor 20 mg daily. LDL at goal.   4. HYPERTENSION, BENIGN ESSENTIAL -stable on current regimen, continues on hctz and lisinopril.   5. Smoldering myeloma ([place]) Stable at this time, followed by oncology, without evidence of progression noted on recent visit.   6. Left ear impacted cerumen -large impaction removed via ear lavage and grabber, pt tolerated well.   7. Dementia without behavioral disturbance, unspecified dementia type ([place]) -stable. Continues on aricept 5 mg daily, could not tolerate 10 mg. Still very independent with the help of family.   Next appt: 4 months.  Carlos American. [place], [facility] [phone]

## 2020-11-13 DIAGNOSIS — H524 Presbyopia: Secondary | ICD-10-CM | POA: Diagnosis not present

## 2020-12-03 ENCOUNTER — Other Ambulatory Visit: Payer: Self-pay | Admitting: Nurse Practitioner

## 2020-12-03 ENCOUNTER — Other Ambulatory Visit: Payer: Self-pay | Admitting: *Deleted

## 2020-12-03 DIAGNOSIS — I10 Essential (primary) hypertension: Secondary | ICD-10-CM

## 2020-12-03 MED ORDER — HYDROCHLOROTHIAZIDE 25 MG PO TABS: ORAL_TABLET | ORAL | 1 refills | Status: DC

## 2020-12-03 NOTE — Telephone Encounter (Signed)
Patient requested refill

## 2020-12-10 ENCOUNTER — Other Ambulatory Visit: Payer: Self-pay | Admitting: *Deleted

## 2020-12-10 MED ORDER — DENOSUMAB 60 MG/ML ~~LOC~~ SOSY
60.0000 mg | PREFILLED_SYRINGE | SUBCUTANEOUS | 0 refills | Status: DC
Start: 2020-12-10 — End: 2020-12-21

## 2020-12-10 NOTE — Telephone Encounter (Signed)
Misty Stanley, coordinator, given request for Prolia refill.

## 2020-12-21 ENCOUNTER — Other Ambulatory Visit: Payer: Self-pay

## 2020-12-21 ENCOUNTER — Ambulatory Visit (INDEPENDENT_AMBULATORY_CARE_PROVIDER_SITE_OTHER): Payer: Medicare Other

## 2020-12-21 DIAGNOSIS — M810 Age-related osteoporosis without current pathological fracture: Secondary | ICD-10-CM

## 2020-12-21 MED ORDER — DENOSUMAB 60 MG/ML ~~LOC~~ SOSY
60.0000 mg | PREFILLED_SYRINGE | Freq: Once | SUBCUTANEOUS | Status: AC
  Administered 2020-12-21: 60 mg via SUBCUTANEOUS

## 2020-12-31 ENCOUNTER — Other Ambulatory Visit: Payer: Self-pay | Admitting: Nurse Practitioner

## 2020-12-31 DIAGNOSIS — F0390 Unspecified dementia without behavioral disturbance: Secondary | ICD-10-CM

## 2021-01-14 ENCOUNTER — Ambulatory Visit (INDEPENDENT_AMBULATORY_CARE_PROVIDER_SITE_OTHER): Payer: Medicare Other | Admitting: Nurse Practitioner

## 2021-01-14 ENCOUNTER — Other Ambulatory Visit: Payer: Self-pay

## 2021-01-14 ENCOUNTER — Encounter: Payer: Self-pay | Admitting: Nurse Practitioner

## 2021-01-14 VITALS — BP 152/80 | HR 71 | Temp 96.0°F | Ht 67.0 in | Wt 145.0 lb

## 2021-01-14 DIAGNOSIS — C9000 Multiple myeloma not having achieved remission: Secondary | ICD-10-CM | POA: Diagnosis not present

## 2021-01-14 DIAGNOSIS — E559 Vitamin D deficiency, unspecified: Secondary | ICD-10-CM

## 2021-01-14 DIAGNOSIS — E782 Mixed hyperlipidemia: Secondary | ICD-10-CM | POA: Diagnosis not present

## 2021-01-14 DIAGNOSIS — M810 Age-related osteoporosis without current pathological fracture: Secondary | ICD-10-CM | POA: Diagnosis not present

## 2021-01-14 DIAGNOSIS — D472 Monoclonal gammopathy: Secondary | ICD-10-CM

## 2021-01-14 DIAGNOSIS — I10 Essential (primary) hypertension: Secondary | ICD-10-CM

## 2021-01-14 DIAGNOSIS — F325 Major depressive disorder, single episode, in full remission: Secondary | ICD-10-CM

## 2021-01-14 DIAGNOSIS — F0390 Unspecified dementia without behavioral disturbance: Secondary | ICD-10-CM

## 2021-01-14 NOTE — Patient Instructions (Signed)
Take blood pressure medication before visit.

## 2021-01-14 NOTE — Progress Notes (Signed)
Careteam: Patient Care Team: Jasmine Chandler, NP as PCP - General (Geriatric Medicine)  PLACE OF SERVICE:  [place] [place] Directive information Does Patient Have a Medical Advance Directive?: Yes, Type of Advance Directive: Out of facility DNR (pink MOST or yellow form), Pre-existing out of facility DNR order (yellow form or pink MOST form): Yellow form placed in chart (order not valid for inpatient use);Pink MOST form placed in chart (order not valid for inpatient use), Does patient want to make changes to medical advance directive?: No - Patient declined  No Known Allergies  Chief Complaint  Patient presents with  . Form Completion    Complete personal care services form. Here with daughter, Jasmine Fletcher      HPI: Patient is a 87 y.o. female here for follow up.  Dementia- pt still lives alone, still has frequent visitation from her daughter and other friends/family. Not very active at home, reports she gets tired easily when trying to do routine chores around the house. Here today with daughter to complete personal care services form to help with activities of daily living at home. Eats small meals throughout the day, reports she is keeping herself hydrated. Taking Aricept daily.    Smoldering myeloma- stable at this time, followed by [facility]. Next appointment on 2/9 for lab work and imaging. Discussing results on 2/23 with Dr. Irene Limbo.   htn- stable on lisinopril/HCTZ, BP 152/80 today. Reports she does not take her BP at home. Has not taken BP medication today and generally takes in the morning.    Osteoporosis- just received her prolia injection in Jan, continues every 6 months. continues on vitamin D.  Review of Systems:  Review of Systems  Constitutional: Negative for chills, fever and weight loss.  HENT: Negative for tinnitus.   Respiratory: Negative for cough, sputum production and shortness of breath.   Cardiovascular: Negative for chest  pain, palpitations and leg swelling.  Gastrointestinal: Negative for abdominal pain, constipation, nausea and vomiting.  Genitourinary: Negative for dysuria, frequency and urgency.  Musculoskeletal: Negative for back pain, falls and myalgias.  Skin: Negative.   Neurological: Negative for dizziness, weakness and headaches.  Psychiatric/Behavioral: Positive for depression (intermittently) and memory loss. The patient does not have insomnia.     Past Medical History:  Diagnosis Date  . Hyperlipidemia   . Hypertension   . Osteoporosis    Past Surgical History:  Procedure Laterality Date  . ABDOMINAL HYSTERECTOMY  1998   Social History:   reports that she quit smoking about 18 years ago. She smoked 1.00 pack per day. She has never used smokeless tobacco. She reports that she does not drink alcohol and does not use drugs.  Family History  Problem Relation Age of Onset  . Cancer Father   . Dementia Sister   . Breast cancer Maternal Grandmother   . Breast cancer Paternal Grandmother     Medications: Patient's Medications  New Prescriptions   No medications on file  Previous Medications   ASPIRIN 81 MG TABLET    Take 81 mg by mouth daily.   DONEPEZIL (ARICEPT) 5 MG TABLET    TAKE 1 TABLET(5 MG) BY MOUTH DAILY   FEEDING SUPPLEMENT (BOOST HIGH PROTEIN) LIQD    Take 1 Container by mouth daily.   HYDROCHLOROTHIAZIDE (HYDRODIURIL) 25 MG TABLET    Take one tablet by mouth once daily.   LISINOPRIL (ZESTRIL) 5 MG TABLET    TAKE 1 TABLET(5 MG) BY MOUTH DAILY  SIMVASTATIN (ZOCOR) 20 MG TABLET    TAKE 1 TABLET BY MOUTH EVERY DAY   VITAMIN D, ERGOCALCIFEROL, (DRISDOL) 1.25 MG (50000 UNIT) CAPS CAPSULE    TAKE 1 CAPSULE BY MOUTH 1 TIME A WEEK  Modified Medications   No medications on file  Discontinued Medications   No medications on file    Physical Exam:  Vitals:   01/14/21 0935  BP: (!) 152/80  Pulse: 71  Temp: (!) 96 F (35.6 C)  TempSrc: Temporal  SpO2: 97%  Weight: 145 lb  (65.8 kg)  Height: 5\' 7"  (1.702 m)   Body mass index is 22.71 kg/m. Wt Readings from Last 3 Encounters:  01/14/21 145 lb (65.8 kg)  10/19/20 144 lb 12.8 oz (65.7 kg)  10/10/20 144 lb 8 oz (65.5 kg)    Physical Exam Constitutional:      General: She is not in acute distress.    Appearance: Normal appearance. She is not diaphoretic.  HENT:     Head: Normocephalic and atraumatic.     Mouth/Throat:     Pharynx: No oropharyngeal exudate.  Eyes:     Conjunctiva/sclera: Conjunctivae normal.     Pupils: Pupils are equal, round, and reactive to light.  Cardiovascular:     Rate and Rhythm: Normal rate and regular rhythm.     Heart sounds: Normal heart sounds.  Pulmonary:     Effort: Pulmonary effort is normal.     Breath sounds: Normal breath sounds.  Abdominal:     General: Bowel sounds are normal.     Palpations: Abdomen is soft.  Musculoskeletal:        General: No swelling or tenderness. Normal range of motion.  Skin:    General: Skin is warm and dry.  Neurological:     Mental Status: She is alert and oriented to person, place, and time.  Psychiatric:        Mood and Affect: Mood normal.     Labs reviewed: Basic Metabolic Panel: Recent Labs    02/13/20 0948 03/21/20 1029 06/20/20 1036 10/03/20 0936  NA  --  137 137 137  K  --  3.8 3.6 3.9  CL  --  102 103 104  CO2  --  28 28 26   GLUCOSE  --  81 84 89  BUN  --  21 25* 23  CREATININE  --  1.07* 1.11* 1.01*  CALCIUM  --  10.2 10.6* 10.5*  TSH 5.85*  --   --   --    Liver Function Tests: Recent Labs    03/21/20 1029 06/20/20 1036 10/03/20 0936  AST 16 16 15   ALT 10 8 8   ALKPHOS 53 52 54  BILITOT 0.4 0.2* 0.3  PROT 11.0* 10.7* 11.1*  ALBUMIN 3.3* 3.3* 3.4*   No results for input(s): LIPASE, AMYLASE in the last 8760 hours. No results for input(s): AMMONIA in the last 8760 hours. CBC: Recent Labs    03/21/20 1029 06/20/20 1036 10/03/20 0936  WBC 3.8* 4.4 4.0  NEUTROABS 2.2 2.3 2.3  HGB 11.8* 11.1*  11.4*  HCT 38.9 35.3* 36.7  MCV 87.0 86.9 86.6  PLT 205 205 215   Lipid Panel: Recent Labs    06/13/20 1048  CHOL 152  HDL 42*  LDLCALC 89  TRIG 112  CHOLHDL 3.6   TSH: Recent Labs    02/13/20 0948  TSH 5.85*   A1C: No results found for: HGBA1C   Assessment/Plan 1. Osteoporosis, unspecified osteoporosis type, unspecified pathological  fracture presence -stable, continue prolia every 6 months, received prolia injection a few weeks ago, continues on vitamin D  2. Smoldering myeloma ([place]) Followed by Dr. Irene Limbo, appointment on 2/9 for labs and imaging. Stable at this time.  3. HYPERTENSION, BENIGN ESSENTIAL Slightly elevated today but She did not take medication prior to visit, encouraged this on next follow up.  BP 152/80 today, will try to start checking BP at home. Continues on HCTZ and lisinopril. Encouraged to decrease salt in diet, low sodium foods. .   4. Mixed hyperlipidemia Continue on zocor daily. Last LDL was within goal. Encouraged to eat a healthy diet  5. Vitamin D deficiency Stable, continues on vit D 50,000 units weekly  6. Dementia without behavioral disturbance, unspecified dementia type ([place]) Stable at this time. Taking aricept daily. Still very independent when it comes to taking medications and eating meals throughout the day. Needs assistance with ADLS, filled out Medicaid papers today to help with that.   7. Depression, major, single episode, complete remission ([place]) Good spirits today. Only becomes emotional around the holidays/passing of her son. Daughter is present for support.    Next appt: 4 months Rayli Wiederhold K. Ricarda Atayde, AGNP . [place] [phone]   I personally was present during the history, physical exam and medical decision-making activities of this service and have verified that the service and findings are accurately documented in the student's note

## 2021-01-16 ENCOUNTER — Other Ambulatory Visit: Payer: Medicare Other

## 2021-01-16 ENCOUNTER — Inpatient Hospital Stay: Payer: Medicare Other | Attending: Hematology

## 2021-01-16 ENCOUNTER — Other Ambulatory Visit: Payer: Self-pay

## 2021-01-16 ENCOUNTER — Ambulatory Visit (HOSPITAL_COMMUNITY)
Admission: RE | Admit: 2021-01-16 | Discharge: 2021-01-16 | Disposition: A | Discharge status: Home / Self Care | Payer: Medicare Other | Source: Ambulatory Visit | Attending: Hematology | Admitting: Hematology

## 2021-01-16 DIAGNOSIS — E559 Vitamin D deficiency, unspecified: Secondary | ICD-10-CM | POA: Diagnosis present

## 2021-01-16 DIAGNOSIS — M810 Age-related osteoporosis without current pathological fracture: Secondary | ICD-10-CM | POA: Diagnosis not present

## 2021-01-16 DIAGNOSIS — I10 Essential (primary) hypertension: Secondary | ICD-10-CM | POA: Diagnosis not present

## 2021-01-16 DIAGNOSIS — Z87891 Personal history of nicotine dependence: Secondary | ICD-10-CM | POA: Insufficient documentation

## 2021-01-16 DIAGNOSIS — D472 Monoclonal gammopathy: Secondary | ICD-10-CM

## 2021-01-16 DIAGNOSIS — N189 Chronic kidney disease, unspecified: Secondary | ICD-10-CM | POA: Diagnosis not present

## 2021-01-16 DIAGNOSIS — C9000 Multiple myeloma not having achieved remission: Secondary | ICD-10-CM | POA: Diagnosis not present

## 2021-01-16 DIAGNOSIS — E8809 Other disorders of plasma-protein metabolism, not elsewhere classified: Secondary | ICD-10-CM | POA: Diagnosis not present

## 2021-01-16 LAB — VITAMIN D 25 HYDROXY (VIT D DEFICIENCY, FRACTURES): Vit D, 25-Hydroxy: 129.15 ng/mL — ABNORMAL HIGH (ref 30–100)

## 2021-01-16 LAB — CMP (CANCER CENTER ONLY)
ALT: 10 U/L (ref 0–44)
AST: 17 U/L (ref 15–41)
Albumin: 3.5 g/dL (ref 3.5–5.0)
Alkaline Phosphatase: 59 U/L (ref 38–126)
Anion gap: 8 (ref 5–15)
BUN: 24 mg/dL — ABNORMAL HIGH (ref 8–23)
CO2: 26 mmol/L (ref 22–32)
Calcium: 10.2 mg/dL (ref 8.9–10.3)
Chloride: 103 mmol/L (ref 98–111)
Creatinine: 1.1 mg/dL — ABNORMAL HIGH (ref 0.44–1.00)
GFR, Estimated: 49 mL/min — ABNORMAL LOW (ref >60)
Glucose, Bld: 84 mg/dL (ref 70–99)
Potassium: 3.7 mmol/L (ref 3.5–5.1)
Sodium: 137 mmol/L (ref 135–145)
Total Bilirubin: 0.4 mg/dL (ref 0.3–1.2)
Total Protein: 11.5 g/dL — ABNORMAL HIGH (ref 6.5–8.1)

## 2021-01-16 LAB — CBC WITH DIFFERENTIAL/PLATELET
Abs Immature Granulocytes: 0.02 10*3/uL (ref 0.00–0.07)
Basophils Absolute: 0 10*3/uL (ref 0.0–0.1)
Basophils Relative: 1 %
Eosinophils Absolute: 0 10*3/uL (ref 0.0–0.5)
Eosinophils Relative: 1 %
HCT: 35.9 % — ABNORMAL LOW (ref 36.0–46.0)
Hemoglobin: 11.3 g/dL — ABNORMAL LOW (ref 12.0–15.0)
Immature Granulocytes: 0 %
Lymphocytes Relative: 33 %
Lymphs Abs: 1.5 10*3/uL (ref 0.7–4.0)
MCH: 27.2 pg (ref 26.0–34.0)
MCHC: 31.5 g/dL (ref 30.0–36.0)
MCV: 86.3 fL (ref 80.0–100.0)
Monocytes Absolute: 0.5 10*3/uL (ref 0.1–1.0)
Monocytes Relative: 11 %
Neutro Abs: 2.5 10*3/uL (ref 1.7–7.7)
Neutrophils Relative %: 54 %
Platelets: 226 10*3/uL (ref 150–400)
RBC: 4.16 MIL/uL (ref 3.87–5.11)
RDW: 14.1 % (ref 11.5–15.5)
WBC: 4.6 10*3/uL (ref 4.0–10.5)
nRBC: 0 % (ref 0.0–0.2)

## 2021-01-17 LAB — KAPPA/LAMBDA LIGHT CHAINS
Kappa free light chain: 386.8 mg/L — ABNORMAL HIGH (ref 3.3–19.4)
Kappa, lambda light chain ratio: 56.88 — ABNORMAL HIGH (ref 0.26–1.65)
Lambda free light chains: 6.8 mg/L (ref 5.7–26.3)

## 2021-01-18 ENCOUNTER — Other Ambulatory Visit: Payer: Self-pay | Admitting: Hematology

## 2021-01-18 LAB — MULTIPLE MYELOMA PANEL, SERUM
Albumin SerPl Elph-Mcnc: 4.2 g/dL (ref 2.9–4.4)
Albumin/Glob SerPl: 0.7 (ref 0.7–1.7)
Alpha 1: 0.3 g/dL (ref 0.0–0.4)
Alpha2 Glob SerPl Elph-Mcnc: 0.8 g/dL (ref 0.4–1.0)
B-Globulin SerPl Elph-Mcnc: 1.1 g/dL (ref 0.7–1.3)
Gamma Glob SerPl Elph-Mcnc: 4.3 g/dL — ABNORMAL HIGH (ref 0.4–1.8)
Globulin, Total: 6.4 g/dL — ABNORMAL HIGH (ref 2.2–3.9)
IgA: 87 mg/dL (ref 64–422)
IgG (Immunoglobin G), Serum: 5417 mg/dL — ABNORMAL HIGH (ref 586–1602)
IgM (Immunoglobulin M), Srm: 12 mg/dL — ABNORMAL LOW (ref 26–217)
M Protein SerPl Elph-Mcnc: 3.8 g/dL — ABNORMAL HIGH
Total Protein ELP: 10.6 g/dL — ABNORMAL HIGH (ref 6.0–8.5)

## 2021-01-29 NOTE — Progress Notes (Signed)
HEMATOLOGY/ONCOLOGY NOTE  Date of Service: 01/29/2021  Patient Care Team: Lauree Chandler, NP as PCP - General (Geriatric Medicine)   CHIEF COMPLAINTS:  F/u for smoldering myeloma   HISTORY OF PRESENTING ILLNESS:   Jasmine Fletcher is a wonderful 87 y.o. female who has been referred to [place] by Lauree Chandler, NP for evaluation and management of elevated serum globulin levels. The pt reports that she is doing well overall.  The pt reports that her appetite has decreased recently but that she eats a healthy diet. She notes mild fatigue. Denies new bone pain, back pain, hip pain, infection concerns, and any other new concerns.  She has been getting a Prolia shot every 6 months for the past 2 years. She has also been taking Aricept but notes that her memory is worsening. Her daughter is her health care POA, but the pt makes her own healthcare decisions at this time.  Most recent lab results (06/14/2019) of CBC is as follows: all values are WNL except for HGB at 11.4, MCH at 26.8. 06/21/2019 UPEP revealed protein/creat ratio at 209, total protein at 29, and two abnormal protein bands 06/14/2019 SPEP revealed total protein at 10.1, Alpha 2 at 1.0, gamma globulin at 4.1, abnormal protein band1 at 3.9  On review of systems, pt reports some fatigue, decreased appetite, and denies new bone pain, back pain, hip pain, infection concerns, and any other symptoms.   On PMHx the pt reports that she is taking BP medication  On Social Hx the pt reports that she quit smoking in 2004 and does not drink alcohol   INTERVAL HISTORY:  Jasmine Fletcher is a 87 y.o. female here for evaluation and management of plasma cell dyscrasia. We are joined today by her daughter. The patient's last visit with [place] was on 10/10/2020. The pt reports that she is doing well overall.  The pt reports that her fatigue has much worsened since the last visit. The pt notes that she normally cleans dishes in her kitchen  but her family has noticed she has been too tired to do this lately. The pt notes she gets more naps than sleep and naps a lot during the day now despite not liking to do this often. The pt attributes some of this change in sleep schedule to the weather and darkness of the season. The pt's daughter notes that she has also noticed this fatigue and change in the pt's baseline. The pt also notes that her right wrist "gives away" sometimes and she is scared to pick up some pots and things due to this. The pt no longer cooks due to these cramps, and they occur 2-3x weekly.  The pt's daughter notes that these changes have started since the pandemic and have gradually worsened. The pt notes that she has been very active and social for the last 30-40 years and this has changed in the last years. The pt notes she is inclined to join a book club, cooking club, and walking more to stay active. She desires to do day trips. The pt is up to date on all vaccines.  The pt is still taking her Vitamin D and notes no medication changes.  Of note since the patient's last visit, pt has had DG Bone Survey Met ([id number]) on 01/16/2021, which revealed "No lytic or sclerotic lesions are noted in the skull, spine, rib cage, pelvis or extremities."  Lab results 01/16/2021 of CBC w/diff and CMP is as follows: all  values are WNL except for Hgb of 11.3, HCT of 35.9, BUN of 24, Creatinine of 1.36m Total Protein of 11.5, GFR est of 49. 01/16/2021 Vitamin D 25 Hydroxy of 129.15. 01/16/2021 Light Chains WNL except Kappa free light chain of 386.8, Kappa lambda ratio of 56.88. 01/16/2021 MMP WNL except IgG of 5,417, IgM of 12, Total Protein ELP of 10.6, Gamma Glob SerPl Ekph-Mcnc of 4.3, m protein of 3.8, Globulin Total of 6.4.  On review of systems, pt reports fatigue, cramping in right hand and denies numbness, tingling, infection issues, acute bone pains, decreased appetite, unexpected weight loss, abdominal pain, back pain, leg  swelling, and any other symptoms.  MEDICAL HISTORY:  Past Medical History:  Diagnosis Date  . Hyperlipidemia   . Hypertension   . Osteoporosis     SURGICAL HISTORY: Past Surgical History:  Procedure Laterality Date  . ABDOMINAL HYSTERECTOMY  1998    SOCIAL HISTORY: Social History   Socioeconomic History  . Marital status: Single    Spouse name: Not on file  . Number of children: Not on file  . Years of education: Not on file  . Highest education level: Not on file  Occupational History  . Not on file  Tobacco Use  . Smoking status: Former Smoker    Packs/day: 1.00    Quit date: 12/08/2002    Years since quitting: 18.1  . Smokeless tobacco: Never Used  . Tobacco comment: Quit t age 87  Vaping Use  . Vaping Use: Never used  Substance and Sexual Activity  . Alcohol use: No    Alcohol/week: 0.0 standard drinks  . Drug use: No  . Sexual activity: Not Currently  Other Topics Concern  . Not on file  Social History Narrative  . Not on file   Social Determinants of Health   Financial Resource Strain: Not on file  Food Insecurity: Not on file  Transportation Needs: Not on file  Physical Activity: Not on file  Stress: Not on file  Social Connections: Not on file  Intimate Partner Violence: Not on file    FAMILY HISTORY: Family History  Problem Relation Age of Onset  . Cancer Father   . Dementia Sister   . Breast cancer Maternal Grandmother   . Breast cancer Paternal Grandmother     ALLERGIES:  has No Known Allergies.  MEDICATIONS:  Current Outpatient Medications  Medication Sig Dispense Refill  . aspirin 81 MG tablet Take 81 mg by mouth daily.    .Marland Kitchendonepezil (ARICEPT) 5 MG tablet TAKE 1 TABLET(5 MG) BY MOUTH DAILY 90 tablet 1  . feeding supplement (BOOST HIGH PROTEIN) LIQD Take 1 Container by mouth daily.    . hydrochlorothiazide (HYDRODIURIL) 25 MG tablet Take one tablet by mouth once daily. 90 tablet 1  . lisinopril (ZESTRIL) 5 MG tablet TAKE 1  TABLET(5 MG) BY MOUTH DAILY 90 tablet 1  . simvastatin (ZOCOR) 20 MG tablet TAKE 1 TABLET BY MOUTH EVERY DAY 90 tablet 1  . Vitamin D, Ergocalciferol, (DRISDOL) 1.25 MG (50000 UNIT) CAPS capsule TAKE 1 CAPSULE BY MOUTH 1 TIME A WEEK 12 capsule 2   No current facility-administered medications for this visit.    REVIEW OF SYSTEMS:   10 Point review of Systems was done is negative except as noted above.  PHYSICAL EXAMINATION: There were no vitals filed for this visit. Wt Readings from Last 3 Encounters:  01/14/21 145 lb (65.8 kg)  10/19/20 144 lb 12.8 oz (65.7 kg)  10/10/20  144 lb 8 oz (65.5 kg)   There is no height or weight on file to calculate BMI.    ECOG FS:2 - Symptomatic, <50% confined to bed   Exam was given in a chair.  GENERAL:alert, in no acute distress and comfortable SKIN: no acute rashes, no significant lesions EYES: conjunctiva are pink and non-injected, sclera anicteric OROPHARYNX: MMM, no exudates, no oropharyngeal erythema or ulceration NECK: supple, no JVD LYMPH:  no palpable lymphadenopathy in the cervical, axillary or inguinal regions LUNGS: clear to auscultation b/l with normal respiratory effort HEART: regular rate & rhythm ABDOMEN:  normoactive bowel sounds , non tender, not distended. Extremity: no pedal edema PSYCH: alert & oriented x 3 with fluent speech NEURO: no focal motor/sensory deficits   LABORATORY DATA:  I have reviewed the data as listed  . CBC Latest Ref Rng & Units 01/16/2021 10/03/2020 06/20/2020  WBC 4.0 - 10.5 K/uL 4.6 4.0 4.4  Hemoglobin 12.0 - 15.0 g/dL 11.3(L) 11.4(L) 11.1(L)  Hematocrit 36.0 - 46.0 % 35.9(L) 36.7 35.3(L)  Platelets 150 - 400 K/uL 226 215 205    . CMP Latest Ref Rng & Units 01/16/2021 10/03/2020 06/20/2020  Glucose 70 - 99 mg/dL 84 89 84  BUN 8 - 23 mg/dL 24(H) 23 25(H)  Creatinine 0.44 - 1.00 mg/dL 1.10(H) 1.01(H) 1.11(H)  Sodium 135 - 145 mmol/L 137 137 137  Potassium 3.5 - 5.1 mmol/L 3.7 3.9 3.6  Chloride 98  - 111 mmol/L 103 104 103  CO2 22 - 32 mmol/L 26 26 28   Calcium 8.9 - 10.3 mg/dL 10.2 10.5(H) 10.6(H)  Total Protein 6.5 - 8.1 g/dL 11.5(H) 11.1(H) 10.7(H)  Total Bilirubin 0.3 - 1.2 mg/dL 0.4 0.3 0.2(L)  Alkaline Phos 38 - 126 U/L 59 54 52  AST 15 - 41 U/L 17 15 16   ALT 0 - 44 U/L 10 8 8    Component     Latest Ref Rng & Units 06/21/2019 07/28/2019 08/01/2019  Total Protein, Urine-UPE24     Not Estab. mg/dL   8.2  Total Protein, Urine-Ur/day     30 - 150 mg/24 hr   180 (H)  ALBUMIN, U     %   19.7  ALPHA 1 URINE     %   1.5  Alpha 2, Urine     %   6.0  % BETA, Urine     %   21.4  GAMMA GLOBULIN URINE     %   51.4  Free Kappa Lt Chains,Ur     0.63 - 113.79 mg/L   383.74 (H)  Free Lambda Lt Chains,Ur     0.47 - 11.77 mg/L   0.68  Free Kappa/Lambda Ratio     1.03 - 31.76   564.32 (H)  Immunofixation Result, Urine        Comment  Total Volume        2,200  M-SPIKE %, Urine     Not Observed %   28.3 (H)  M-Spike, mg/24 hr     Not Observed mg/24 hr   51 (H)  NOTE:        Comment  IgG (Immunoglobin G), Serum     586 - 1,602 mg/dL  5,351 (H)   IgA     64 - 422 mg/dL  99   IgM (Immunoglobulin M), Srm     26 - 217 mg/dL  15 (L)   Total Protein ELP     6.0 - 8.5 g/dL  10.2 (H)  Albumin SerPl Elph-Mcnc     2.9 - 4.4 g/dL  4.1   Alpha 1     0.0 - 0.4 g/dL  0.3   Alpha2 Glob SerPl Elph-Mcnc     0.4 - 1.0 g/dL  0.8   B-Globulin SerPl Elph-Mcnc     0.7 - 1.3 g/dL  1.1   Gamma Glob SerPl Elph-Mcnc     0.4 - 1.8 g/dL  3.9 (H)   M Protein SerPl Elph-Mcnc     Not Observed g/dL  3.4 (H)   Globulin, Total     2.2 - 3.9 g/dL  6.1 (H)   Albumin/Glob SerPl     0.7 - 1.7  0.7   IFE 1       Comment   Please Note (HCV):       Comment   Creatinine, Urine     20 - 275 mg/dL 139    Protein/Creat Ratio     21 - 161 mg/g creat 209 (H)    Protein/Creatinine Ratio     0.021 - 0.16 mg/mg creat 0.209 (H)    Total Protein, Urine     5 - 24 mg/dL 29 (H)    Albumin     % 44     Alpha-1-Globulin, U     % 3    ALPHA-2-GLOBULIN, U     % 12    Beta Globulin, U     % 9    Gamma Globulin, U     % 33    Abnormal Protein Band     NONE DETEC mg/dL 7 (H)    Abnormal Protein Band     NONE DETEC mg/dL 1 (H)    Interpretation          Iron     41 - 142 ug/dL  39 (L)   TIBC     236 - 444 ug/dL  248   Saturation Ratios     21 - 57 %  16 (L)   UIBC     120 - 384 ug/dL  208   Kappa free light chain     3.3 - 19.4 mg/L  412.0 (H)   Lamda free light chains     5.7 - 26.3 mg/L  8.0   Kappa, lamda light chain ratio     0.26 - 1.65  51.50 (H)   Sed Rate     0 - 22 mm/hr  110 (H)   LDH     98 - 192 U/L  135   Ferritin     11 - 307 ng/mL  154   Vitamin B12     180 - 914 pg/mL  709     09/02/2019 Bone Marrow Biopsy ([id number])   08/05/2019 Bone Marrow Biopsy ([id number])        RADIOGRAPHIC STUDIES: I have personally reviewed the radiological images as listed and agreed with the findings in the report. DG Bone Survey Met  Result Date: 01/16/2021 CLINICAL DATA:  Multiple myeloma. EXAM: METASTATIC BONE SURVEY COMPARISON:  August 08, 2019. FINDINGS: No lytic or sclerotic lesions are noted in the skull, spine, rib cage, pelvis or extremities. IMPRESSION: Negative. Electronically Signed   By: Jasmine Fletcher M.D.   On: 01/16/2021 15:37    ASSESSMENT & PLAN:  TIAIRA ARAMBULA is a 87 y.o. female with:  #1 IgG Kappa Monoclonal paraproteinemia with M protein of 3.9 -highly concerning for MM  06/14/2019 SPEP revealed total protein at 10.1, alpha 2  at 1.0, gamma globulin at 4.1, Abnormal protein band1 at 3.9 UPEP +ve for Bence Jones protein  08/08/2019 PET scan with results revealing "Two right thyroid nodules are both highly hypermetabolic. A significant minority of hypermetabolic thyroid lesions represent thyroid malignancy. Further workup with thyroid ultrasound is recommended. A small left upper para-aortic lymph node is mildly hypermetabolic with  maximum SUV 4.7, significance uncertain. No significant abnormal accentuated osseous activity or compelling focal findings of active multiple myeloma in the skeleton. Hypodense lesions in the liver are photopenic and likely benign Hyperdense small lesions of the left kidney upper pole are too small to characterize but statistically most likely to be complex cysts. If the patient has hematuria or if otherwise clinically warranted, renal protocol MRI with and without contrast could be utilized for further characterization. Mild mid to distal accentuated thoracic esophageal activity with maximum SUV of 4.8; mildly accentuated activity in the gastric cardia/gastroesophageal junction with maximum SUV 5.8. Both could be physiologic but rarely low-grade malignancy could have a similar appearance, upper endoscopy could be utilized for further workup if clinically warranted. Other imaging findings of potential clinical significance: Chronic ischemic microvascular white matter disease intracranially. Aortic Atherosclerosis (ICD10-I70.0). Mild cardiomegaly. Biapical pleuroparenchymal scarring and probable scarring in the superior segment left lower lobe. Calcified uterine fibroids. Thoracolumbar scoliosis. No clear bone lesions."  08/05/2019 bone marrow biopsy ([id number]) with results revealing "no monoclonal b cell population or abnormal T cell phenotype identified. Atypical lymphoplasmacytoid aggregates - not clear for myeloma  09/02/2019 bone marrow biopsy ([id number]) with results revealing small popoulation of monoclonal plasma cell. Does not appear to co-related with M protein levels and seem to be underrepresented.  #2. FDG avid thyroid nodules  #3.  Patient Active Problem List   Diagnosis Date Noted  . Depression, major, single episode, complete remission ([place]) 02/13/2020  . Smoldering myeloma ([place]) 10/17/2019  . Vitamin D deficiency 10/17/2019  . Dementia without behavioral disturbance ([place])  07/01/2017  . Mixed hyperlipidemia 11/29/2013  . Osteoporosis 03/15/2013  . BRADYCARDIA 02/17/2009  . HYPERTENSION, BENIGN ESSENTIAL 02/16/2009  . ANEMIA, HYPOCHROMIC 07/26/2007  . DISORDER, TOBACCO USE 07/26/2007  . DEPRESSION 07/26/2007  . CARDIAC CATHETERIZATION, LEFT, HX OF 07/26/2007    PLAN: -Discussed pt labwork, 01/16/2021; Vitamin D levels good. Nothing dramatic in the light chains and MMP. The pt has not majorly progressed. Labs stable at this time. -Discussed pt DG Bone Survey Met ([id number]) on 01/16/2021; all findings negative and unchanged over last 1.5 years. No new bone tumors. -Advised pt that if fatigue was the driver and kept pt from her baseline there would be a better inclination to start treatment. Advised pt that some of these changes could be natural due to pt's age and the pandemic.  -Recommended pt stay socially active to combat fatigue and improve pt's way of life and her wishes.  -Recommended pt stay physically active, sleep well and stay disciplined with this to help with fatigue, eat well, and drink 48-64 oz water daily. -Advised pt risk of progression is between 5-25% for the first three years following diagnosis and 5-10% each year over three years after. -Advised pt that some fluctuation in labs is normal due to pt's age and element of chronic kidney disease. -Advised pt that due to lack of relevant symptoms or worsening anemia, hypercalcemia, or kidney function this is still considered Smoldering Myeloma. No clinical evidence of Smoldering Myeloma progression at this time. -Will see back in 4 months with labs.   FOLLOW UP: RTC w  Dr Irene Limbo in 4 months. Schedule labs 1 week prior to visit.    The total time spent in the appointment was 20 minutes and more than 50% was on counseling and direct patient cares.  All of the patient's questions were answered with apparent satisfaction. The patient knows to call the clinic with any problems, questions or  concerns.   Jasmine Lone MD [place] AAHIVMS [facility] Hematology/Oncology Physician [facility]  (Office):       [phone] (Work cell):  [phone] (Fax):           [phone]  01/29/2021 3:30 PM  I, Reinaldo Raddle, am acting as scribe for Dr. Sullivan Lone, MD.    .I have reviewed the above documentation for accuracy and completeness, and I agree with the above. Brunetta Genera MD

## 2021-01-30 ENCOUNTER — Ambulatory Visit: Payer: Medicare Other | Admitting: Hematology

## 2021-01-30 ENCOUNTER — Telehealth: Payer: Self-pay | Admitting: Nurse Practitioner

## 2021-01-30 ENCOUNTER — Other Ambulatory Visit: Payer: Self-pay

## 2021-01-30 ENCOUNTER — Inpatient Hospital Stay (HOSPITAL_BASED_OUTPATIENT_CLINIC_OR_DEPARTMENT_OTHER): Payer: Medicare Other | Admitting: Hematology

## 2021-01-30 VITALS — BP 160/60 | HR 65 | Temp 97.6°F | Resp 18 | Ht 67.0 in | Wt 142.7 lb

## 2021-01-30 DIAGNOSIS — C9000 Multiple myeloma not having achieved remission: Secondary | ICD-10-CM

## 2021-01-30 DIAGNOSIS — E8809 Other disorders of plasma-protein metabolism, not elsewhere classified: Secondary | ICD-10-CM | POA: Diagnosis not present

## 2021-01-30 DIAGNOSIS — M810 Age-related osteoporosis without current pathological fracture: Secondary | ICD-10-CM | POA: Diagnosis not present

## 2021-01-30 DIAGNOSIS — Z87891 Personal history of nicotine dependence: Secondary | ICD-10-CM | POA: Diagnosis not present

## 2021-01-30 DIAGNOSIS — I10 Essential (primary) hypertension: Secondary | ICD-10-CM | POA: Diagnosis not present

## 2021-01-30 DIAGNOSIS — N189 Chronic kidney disease, unspecified: Secondary | ICD-10-CM | POA: Diagnosis not present

## 2021-01-30 DIAGNOSIS — D472 Monoclonal gammopathy: Secondary | ICD-10-CM

## 2021-01-30 NOTE — Telephone Encounter (Signed)
Merlín.Osler- resident coordinator called to check status on [facility] referral for pt. Referral form was completed & fax at [facility] with Janett Billow 01/14/2021. Fax from [place] was rtn with note:  Benefiary phone # needed MID# needed Updated ADL impact  Checked with CMA, completed & faxed back 01/30/21. Faxed a copy of referral to [place] at [facility]  [phone]  Thanks, Vilinda Blanks

## 2021-02-20 ENCOUNTER — Ambulatory Visit: Payer: Medicare Other | Admitting: Nurse Practitioner

## 2021-03-01 ENCOUNTER — Other Ambulatory Visit: Payer: Self-pay | Admitting: Nurse Practitioner

## 2021-03-01 DIAGNOSIS — I10 Essential (primary) hypertension: Secondary | ICD-10-CM

## 2021-03-29 ENCOUNTER — Other Ambulatory Visit: Payer: Self-pay | Admitting: Nurse Practitioner

## 2021-03-29 DIAGNOSIS — F0390 Unspecified dementia without behavioral disturbance: Secondary | ICD-10-CM

## 2021-04-30 ENCOUNTER — Encounter: Payer: Self-pay | Admitting: Nurse Practitioner

## 2021-05-27 ENCOUNTER — Telehealth: Payer: Self-pay | Admitting: Hematology

## 2021-05-27 NOTE — Telephone Encounter (Signed)
R/s appt per 6/20 sch msg. Pt's daughter is aware.

## 2021-05-29 ENCOUNTER — Inpatient Hospital Stay: Payer: Medicare Other | Attending: Hematology

## 2021-05-29 ENCOUNTER — Other Ambulatory Visit: Payer: Self-pay

## 2021-05-29 DIAGNOSIS — D472 Monoclonal gammopathy: Secondary | ICD-10-CM | POA: Insufficient documentation

## 2021-05-29 LAB — CBC WITH DIFFERENTIAL/PLATELET
Abs Immature Granulocytes: 0.01 10*3/uL (ref 0.00–0.07)
Basophils Absolute: 0 10*3/uL (ref 0.0–0.1)
Basophils Relative: 1 %
Eosinophils Absolute: 0.1 10*3/uL (ref 0.0–0.5)
Eosinophils Relative: 1 %
HCT: 32.8 % — ABNORMAL LOW (ref 36.0–46.0)
Hemoglobin: 10.4 g/dL — ABNORMAL LOW (ref 12.0–15.0)
Immature Granulocytes: 0 %
Lymphocytes Relative: 33 %
Lymphs Abs: 1.2 10*3/uL (ref 0.7–4.0)
MCH: 27 pg (ref 26.0–34.0)
MCHC: 31.7 g/dL (ref 30.0–36.0)
MCV: 85.2 fL (ref 80.0–100.0)
Monocytes Absolute: 0.4 10*3/uL (ref 0.1–1.0)
Monocytes Relative: 12 %
Neutro Abs: 2 10*3/uL (ref 1.7–7.7)
Neutrophils Relative %: 53 %
Platelets: 214 10*3/uL (ref 150–400)
RBC: 3.85 MIL/uL — ABNORMAL LOW (ref 3.87–5.11)
RDW: 14.3 % (ref 11.5–15.5)
WBC: 3.7 10*3/uL — ABNORMAL LOW (ref 4.0–10.5)
nRBC: 0 % (ref 0.0–0.2)

## 2021-05-29 LAB — CMP (CANCER CENTER ONLY)
ALT: 7 U/L (ref 0–44)
AST: 15 U/L (ref 15–41)
Albumin: 3.2 g/dL — ABNORMAL LOW (ref 3.5–5.0)
Alkaline Phosphatase: 47 U/L (ref 38–126)
Anion gap: 8 (ref 5–15)
BUN: 22 mg/dL (ref 8–23)
CO2: 26 mmol/L (ref 22–32)
Calcium: 10.7 mg/dL — ABNORMAL HIGH (ref 8.9–10.3)
Chloride: 103 mmol/L (ref 98–111)
Creatinine: 1.06 mg/dL — ABNORMAL HIGH (ref 0.44–1.00)
GFR, Estimated: 51 mL/min — ABNORMAL LOW (ref >60)
Glucose, Bld: 74 mg/dL (ref 70–99)
Potassium: 3.9 mmol/L (ref 3.5–5.1)
Sodium: 137 mmol/L (ref 135–145)
Total Bilirubin: 0.3 mg/dL (ref 0.3–1.2)
Total Protein: 10.3 g/dL — ABNORMAL HIGH (ref 6.5–8.1)

## 2021-06-02 LAB — MULTIPLE MYELOMA PANEL, SERUM
Albumin SerPl Elph-Mcnc: 3.8 g/dL (ref 2.9–4.4)
Albumin/Glob SerPl: 0.8 (ref 0.7–1.7)
Alpha 1: 0.2 g/dL (ref 0.0–0.4)
Alpha2 Glob SerPl Elph-Mcnc: 0.8 g/dL (ref 0.4–1.0)
B-Globulin SerPl Elph-Mcnc: 0.9 g/dL (ref 0.7–1.3)
Gamma Glob SerPl Elph-Mcnc: 3.5 g/dL — ABNORMAL HIGH (ref 0.4–1.8)
Globulin, Total: 5.4 g/dL — ABNORMAL HIGH (ref 2.2–3.9)
IgA: 77 mg/dL (ref 64–422)
IgG (Immunoglobin G), Serum: 4899 mg/dL — ABNORMAL HIGH (ref 586–1602)
IgM (Immunoglobulin M), Srm: 12 mg/dL — ABNORMAL LOW (ref 26–217)
M Protein SerPl Elph-Mcnc: 3.3 g/dL — ABNORMAL HIGH
Total Protein ELP: 9.2 g/dL — ABNORMAL HIGH (ref 6.0–8.5)

## 2021-06-05 ENCOUNTER — Telehealth: Payer: Self-pay | Admitting: Hematology

## 2021-06-05 ENCOUNTER — Ambulatory Visit: Payer: Medicare Other | Admitting: Hematology

## 2021-06-05 NOTE — Telephone Encounter (Signed)
Left message with changed MD appointment due to provider not in office.

## 2021-06-06 NOTE — Progress Notes (Signed)
HEMATOLOGY/ONCOLOGY NOTE  Date of Service: 06/07/2021  Patient Care Team: Lauree Chandler, NP as PCP - General (Geriatric Medicine)   CHIEF COMPLAINTS:  F/u for smoldering myeloma   HISTORY OF PRESENTING ILLNESS:   Jasmine Fletcher is a wonderful 87 y.o. female who has been referred to [place] by Lauree Chandler, NP for evaluation and management of elevated serum globulin levels. The pt reports that she is doing well overall.  The pt reports that her appetite has decreased recently but that she eats a healthy diet. She notes mild fatigue. Denies new bone pain, back pain, hip pain, infection concerns, and any other new concerns.  She has been getting a Prolia shot every 6 months for the past 2 years. She has also been taking Aricept but notes that her memory is worsening. Her daughter is her health care POA, but the pt makes her own healthcare decisions at this time.  Most recent lab results (06/14/2019) of CBC is as follows: all values are WNL except for HGB at 11.4, MCH at 26.8. 06/21/2019 UPEP revealed protein/creat ratio at 209, total protein at 29, and two abnormal protein bands 06/14/2019 SPEP revealed total protein at 10.1, Alpha 2 at 1.0, gamma globulin at 4.1, abnormal protein band1 at 3.9  On review of systems, pt reports some fatigue, decreased appetite, and denies new bone pain, back pain, hip pain, infection concerns, and any other symptoms.   On PMHx the pt reports that she is taking BP medication  On Social Hx the pt reports that she quit smoking in 2004 and does not drink alcohol   INTERVAL HISTORY: I connected with Jasmine Fletcher on 06/07/2021 by telephone and verified that I am speaking with the correct person using two identifiers.   I discussed the limitations of evaluation and management by telemedicine. The patient expressed understanding and agreed to proceed.   Other persons participating in the visit and their role in the encounter:                                                          - Reinaldo Raddle, Medical Scribe     Patient's location: Home Provider's location: [place] at [employer] is a 87 y.o. female here for evaluation and management of plasma cell dyscrasia. The patient's last visit with [place] was on 01/30/2021. The pt reports that she is doing well overall.  The pt reports that she has been feeling well with intermittent fatigue and headaches. She notes these headaches resolve on their own after some time. She takes a Tylenol when waking up due to mild dizziness. Her appetite fluctuates, with some instances of decreased appetite. She continues to drink Boost and gets enough water daily. The patient notes no medication changes or new medical issues.  Lab results 05/29/2021 of CBC w/diff and CMP is as follows: all values are WNL except for WBC of 3.7K, RBC of 3.85, Hgb of 10.4, HCT of 32.8, Creatinine of 1.06, Calcium of 10.7, Total Protein of 10.3, Albumin of 3.2, GFR est of 51. 05/29/2021 MMP WNL except IgG of 4899, IgM of 12, Total protein of 9.2, Gamma Glob of 3.5, m-protein of 3.3, Globulin total of 5.4.  On review of systems, pt reports intermittent fatigue, headaches, mild dizziness, difficulty  sleeping and denies leg swelling, new bone pains, and any other symptoms.   MEDICAL HISTORY:  Past Medical History:  Diagnosis Date   Hyperlipidemia    Hypertension    Osteoporosis     SURGICAL HISTORY: Past Surgical History:  Procedure Laterality Date   ABDOMINAL HYSTERECTOMY  1998    SOCIAL HISTORY: Social History   Socioeconomic History   Marital status: Single    Spouse name: Not on file   Number of children: Not on file   Years of education: Not on file   Highest education level: Not on file  Occupational History   Not on file  Tobacco Use   Smoking status: Former    Packs/day: 1.00    Pack years: 0.00    Types: Cigarettes    Quit date: 12/08/2002    Years since quitting: 18.5   Smokeless tobacco:  Never   Tobacco comments:    Quit t age 50   Vaping Use   Vaping Use: Never used  Substance and Sexual Activity   Alcohol use: No    Alcohol/week: 0.0 standard drinks   Drug use: No   Sexual activity: Not Currently  Other Topics Concern   Not on file  Social History Narrative   ** Merged History Encounter **       Social Determinants of Health   Financial Resource Strain: Not on file  Food Insecurity: Not on file  Transportation Needs: Not on file  Physical Activity: Not on file  Stress: Not on file  Social Connections: Not on file  Intimate Partner Violence: Not on file    FAMILY HISTORY: Family History  Problem Relation Age of Onset   Cancer Father    Dementia Sister    Breast cancer Maternal Grandmother    Breast cancer Paternal Grandmother     ALLERGIES:  has No Known Allergies.  MEDICATIONS:  Current Outpatient Medications  Medication Sig Dispense Refill   aspirin 81 MG tablet Take 81 mg by mouth daily.     donepezil (ARICEPT) 5 MG tablet TAKE 1 TABLET(5 MG) BY MOUTH DAILY 90 tablet 1   feeding supplement (BOOST HIGH PROTEIN) LIQD Take 1 Container by mouth daily.     hydrochlorothiazide (HYDRODIURIL) 25 MG tablet TAKE 1 TABLET BY MOUTH EVERY DAY 90 tablet 1   lisinopril (ZESTRIL) 5 MG tablet TAKE 1 TABLET(5 MG) BY MOUTH DAILY 90 tablet 1   simvastatin (ZOCOR) 20 MG tablet TAKE 1 TABLET BY MOUTH EVERY DAY 90 tablet 1   Vitamin D, Ergocalciferol, (DRISDOL) 1.25 MG (50000 UNIT) CAPS capsule TAKE 1 CAPSULE BY MOUTH 1 TIME A WEEK 12 capsule 2   No current facility-administered medications for this visit.    REVIEW OF SYSTEMS:   10 Point review of Systems was done is negative except as noted above.  PHYSICAL EXAMINATION: There were no vitals filed for this visit. Wt Readings from Last 3 Encounters:  01/30/21 142 lb 11.2 oz (64.7 kg)  01/14/21 145 lb (65.8 kg)  10/19/20 144 lb 12.8 oz (65.7 kg)   There is no height or weight on file to calculate BMI.     ECOG FS:2 - Symptomatic, <50% confined to bed   Telehealth Visit.   LABORATORY DATA:  I have reviewed the data as listed  . CBC Latest Ref Rng & Units 05/29/2021 01/16/2021 10/03/2020  WBC 4.0 - 10.5 K/uL 3.7(L) 4.6 4.0  Hemoglobin 12.0 - 15.0 g/dL 10.4(L) 11.3(L) 11.4(L)  Hematocrit 36.0 - 46.0 % 32.8(L)  35.9(L) 36.7  Platelets 150 - 400 K/uL 214 226 215    . CMP Latest Ref Rng & Units 05/29/2021 01/16/2021 10/03/2020  Glucose 70 - 99 mg/dL 74 84 89  BUN 8 - 23 mg/dL 22 24(H) 23  Creatinine 0.44 - 1.00 mg/dL 1.06(H) 1.10(H) 1.01(H)  Sodium 135 - 145 mmol/L 137 137 137  Potassium 3.5 - 5.1 mmol/L 3.9 3.7 3.9  Chloride 98 - 111 mmol/L 103 103 104  CO2 22 - 32 mmol/L 26 26 26   Calcium 8.9 - 10.3 mg/dL 10.7(H) 10.2 10.5(H)  Total Protein 6.5 - 8.1 g/dL 10.3(H) 11.5(H) 11.1(H)  Total Bilirubin 0.3 - 1.2 mg/dL 0.3 0.4 0.3  Alkaline Phos 38 - 126 U/L 47 59 54  AST 15 - 41 U/L 15 17 15   ALT 0 - 44 U/L 7 10 8    Component     Latest Ref Rng & Units 06/21/2019 07/28/2019 08/01/2019  Total Protein, Urine-UPE24     Not Estab. mg/dL   8.2  Total Protein, Urine-Ur/day     30 - 150 mg/24 hr   180 (H)  ALBUMIN, U     %   19.7  ALPHA 1 URINE     %   1.5  Alpha 2, Urine     %   6.0  % BETA, Urine     %   21.4  GAMMA GLOBULIN URINE     %   51.4  Free Kappa Lt Chains,Ur     0.63 - 113.79 mg/L   383.74 (H)  Free Lambda Lt Chains,Ur     0.47 - 11.77 mg/L   0.68  Free Kappa/Lambda Ratio     1.03 - 31.76   564.32 (H)  Immunofixation Result, Urine        Comment  Total Volume        2,200  M-SPIKE %, Urine     Not Observed %   28.3 (H)  M-Spike, mg/24 hr     Not Observed mg/24 hr   51 (H)  NOTE:        Comment  IgG (Immunoglobin G), Serum     586 - 1,602 mg/dL  5,351 (H)   IgA     64 - 422 mg/dL  99   IgM (Immunoglobulin M), Srm     26 - 217 mg/dL  15 (L)   Total Protein ELP     6.0 - 8.5 g/dL  10.2 (H)   Albumin SerPl Elph-Mcnc     2.9 - 4.4 g/dL  4.1   Alpha 1     0.0 -  0.4 g/dL  0.3   Alpha2 Glob SerPl Elph-Mcnc     0.4 - 1.0 g/dL  0.8   B-Globulin SerPl Elph-Mcnc     0.7 - 1.3 g/dL  1.1   Gamma Glob SerPl Elph-Mcnc     0.4 - 1.8 g/dL  3.9 (H)   M Protein SerPl Elph-Mcnc     Not Observed g/dL  3.4 (H)   Globulin, Total     2.2 - 3.9 g/dL  6.1 (H)   Albumin/Glob SerPl     0.7 - 1.7  0.7   IFE 1       Comment   Please Note (HCV):       Comment   Creatinine, Urine     20 - 275 mg/dL 139    Protein/Creat Ratio     21 - 161 mg/g creat 209 (H)  Protein/Creatinine Ratio     0.021 - 0.16 mg/mg creat 0.209 (H)    Total Protein, Urine     5 - 24 mg/dL 29 (H)    Albumin     % 44    Alpha-1-Globulin, U     % 3    ALPHA-2-GLOBULIN, U     % 12    Beta Globulin, U     % 9    Gamma Globulin, U     % 33    Abnormal Protein Band     NONE DETEC mg/dL 7 (H)    Abnormal Protein Band     NONE DETEC mg/dL 1 (H)    Interpretation          Iron     41 - 142 ug/dL  39 (L)   TIBC     236 - 444 ug/dL  248   Saturation Ratios     21 - 57 %  16 (L)   UIBC     120 - 384 ug/dL  208   Kappa free light chain     3.3 - 19.4 mg/L  412.0 (H)   Lamda free light chains     5.7 - 26.3 mg/L  8.0   Kappa, lamda light chain ratio     0.26 - 1.65  51.50 (H)   Sed Rate     0 - 22 mm/hr  110 (H)   LDH     98 - 192 U/L  135   Ferritin     11 - 307 ng/mL  154   Vitamin B12     180 - 914 pg/mL  709     09/02/2019 Bone Marrow Biopsy ([id number])   08/05/2019 Bone Marrow Biopsy ([id number])        RADIOGRAPHIC STUDIES: I have personally reviewed the radiological images as listed and agreed with the findings in the report. No results found.   ASSESSMENT & PLAN:  KIMYA MCCAHILL is a 87 y.o. female with:  #1 IgG Kappa Monoclonal paraproteinemia with M protein of 3.9 -highly concerning for MM  06/14/2019 SPEP revealed total protein at 10.1, alpha 2 at 1.0, gamma globulin at 4.1, Abnormal protein band1 at 3.9 UPEP +ve for Bence Jones  protein  08/08/2019 PET scan with results revealing "Two right thyroid nodules are both highly hypermetabolic. A significant minority of hypermetabolic thyroid lesions represent thyroid malignancy. Further workup with thyroid ultrasound is recommended. A small left upper para-aortic lymph node is mildly hypermetabolic with maximum SUV 4.7, significance uncertain. No significant abnormal accentuated osseous activity or compelling focal findings of active multiple myeloma in the skeleton. Hypodense lesions in the liver are photopenic and likely benign Hyperdense small lesions of the left kidney upper pole are too small to characterize but statistically most likely to be complex cysts. If the patient has hematuria or if otherwise clinically warranted, renal protocol MRI with and without contrast could be utilized for further characterization. Mild mid to distal accentuated thoracic esophageal activity with maximum SUV of 4.8; mildly accentuated activity in the gastric cardia/gastroesophageal junction with maximum SUV 5.8. Both could be physiologic but rarely low-grade malignancy could have a similar appearance, upper endoscopy could be utilized for further workup if clinically warranted. Other imaging findings of potential clinical significance: Chronic ischemic microvascular white matter disease intracranially. Aortic Atherosclerosis (ICD10-I70.0). Mild cardiomegaly. Biapical pleuroparenchymal scarring and probable scarring in the superior segment left lower lobe. Calcified uterine fibroids. Thoracolumbar scoliosis. No clear bone lesions."  08/05/2019 bone marrow biopsy ([id number]) with results revealing "no monoclonal b cell population or abnormal T cell phenotype identified. Atypical lymphoplasmacytoid aggregates - not clear for myeloma  09/02/2019 bone marrow biopsy ([id number]) with results revealing small popoulation of monoclonal plasma cell. Does not appear to co-related with M protein levels and seem  to be underrepresented.  #2. FDG avid thyroid nodules  #3.  Patient Active Problem List   Diagnosis Date Noted   Depression, major, single episode, complete remission ([place]) 02/13/2020   Smoldering myeloma ([place]) 10/17/2019   Vitamin D deficiency 10/17/2019   Dementia without behavioral disturbance ([place]) 07/01/2017   Mixed hyperlipidemia 11/29/2013   Osteoporosis 03/15/2013   BRADYCARDIA 02/17/2009   HYPERTENSION, BENIGN ESSENTIAL 02/16/2009   ANEMIA, HYPOCHROMIC 07/26/2007   DISORDER, TOBACCO USE 07/26/2007   DEPRESSION 07/26/2007   CARDIAC CATHETERIZATION, LEFT, HX OF 07/26/2007    PLAN: -Discussed pt labwork, 05/29/2021; increased anemia with Hgb of 10.4, mild hypercalcemia at 10.7, m-protein decreased slightly. All other labs stable. -Advised pt that HCTZ can cause the elevation in calcium. -Will continue with watchful observation due to no major changes in labs or pt's symptoms. -Recommended pt stay socially active to combat fatigue and improve pt's way of life and her wishes.  -Recommended pt stay physically active, sleep well and stay disciplined with this to help with fatigue, eat well, and drink 48-64 oz water daily. -Will see back in 4 months with labs 1-2 weeks prior.   FOLLOW UP: RTC with Dr Irene Limbo in 4 months Plz schedule labs 7-10 days prior to clinic visit    The total time spent in the appointment was 20 minutes and more than 50% was on counseling and direct patient cares.  All of the patient's questions were answered with apparent satisfaction. The patient knows to call the clinic with any problems, questions or concerns.   Sullivan Lone MD [place] AAHIVMS [facility] [place] Hematology/Oncology Physician [facility]  (Office):       [phone] (Work cell):  [phone] (Fax):           [phone]  06/07/2021 10:16 AM  I, Reinaldo Raddle, am acting as scribe for Dr. Sullivan Lone, MD.    .I have reviewed the above documentation for accuracy and completeness,  and I agree with the above. Brunetta Genera MD

## 2021-06-07 ENCOUNTER — Other Ambulatory Visit: Payer: Self-pay

## 2021-06-07 ENCOUNTER — Inpatient Hospital Stay: Payer: Medicare Other | Attending: Hematology | Admitting: Hematology

## 2021-06-07 DIAGNOSIS — C9000 Multiple myeloma not having achieved remission: Secondary | ICD-10-CM | POA: Diagnosis not present

## 2021-06-07 DIAGNOSIS — D472 Monoclonal gammopathy: Secondary | ICD-10-CM

## 2021-06-11 ENCOUNTER — Telehealth: Payer: Self-pay | Admitting: Hematology

## 2021-06-11 NOTE — Telephone Encounter (Signed)
Scheduled follow-up appointments per 7/1 los. Patient's daughter is aware. Mailed calendar.

## 2021-06-21 ENCOUNTER — Other Ambulatory Visit: Payer: Self-pay

## 2021-06-21 ENCOUNTER — Ambulatory Visit (INDEPENDENT_AMBULATORY_CARE_PROVIDER_SITE_OTHER): Payer: Medicare Other | Admitting: Nurse Practitioner

## 2021-06-21 ENCOUNTER — Encounter: Payer: Self-pay | Admitting: Nurse Practitioner

## 2021-06-21 ENCOUNTER — Other Ambulatory Visit: Payer: Self-pay | Admitting: Nurse Practitioner

## 2021-06-21 VITALS — BP 140/80 | HR 94 | Temp 97.6°F | Ht 67.0 in | Wt 138.0 lb

## 2021-06-21 DIAGNOSIS — E559 Vitamin D deficiency, unspecified: Secondary | ICD-10-CM | POA: Diagnosis not present

## 2021-06-21 DIAGNOSIS — C9000 Multiple myeloma not having achieved remission: Secondary | ICD-10-CM

## 2021-06-21 DIAGNOSIS — D649 Anemia, unspecified: Secondary | ICD-10-CM

## 2021-06-21 DIAGNOSIS — E782 Mixed hyperlipidemia: Secondary | ICD-10-CM

## 2021-06-21 DIAGNOSIS — F0390 Unspecified dementia without behavioral disturbance: Secondary | ICD-10-CM | POA: Diagnosis not present

## 2021-06-21 DIAGNOSIS — I10 Essential (primary) hypertension: Secondary | ICD-10-CM

## 2021-06-21 DIAGNOSIS — L989 Disorder of the skin and subcutaneous tissue, unspecified: Secondary | ICD-10-CM

## 2021-06-21 DIAGNOSIS — D472 Monoclonal gammopathy: Secondary | ICD-10-CM

## 2021-06-21 DIAGNOSIS — M810 Age-related osteoporosis without current pathological fracture: Secondary | ICD-10-CM | POA: Diagnosis not present

## 2021-06-21 MED ORDER — VITAMIN D3 25 MCG (1000 UT) PO CAPS
1000.0000 [IU] | ORAL_CAPSULE | Freq: Every day | ORAL | 1 refills | Status: DC
Start: 2021-06-21 — End: 2022-08-19

## 2021-06-21 MED ORDER — DENOSUMAB 60 MG/ML ~~LOC~~ SOSY: 60.0000 mg | PREFILLED_SYRINGE | Freq: Once | SUBCUTANEOUS | Status: DC

## 2021-06-21 MED ORDER — LISINOPRIL 10 MG PO TABS: 10.0000 mg | ORAL_TABLET | Freq: Every day | ORAL | 1 refills | Status: DC

## 2021-06-21 NOTE — Patient Instructions (Addendum)
To schedule AWV via telephone- last was 06/14/20   Dr. Allyn Kenner [phone] dermatology    Stop Hydrochlorothiazide  INCREASE lisinopril to 2 tablets to equal 10 mg daily   Stop Vit D 50000 units weekly Start Vit D 1000 units daily

## 2021-06-21 NOTE — Progress Notes (Signed)
Careteam: Patient Care Team: Lauree Chandler, NP as PCP - General (Geriatric Medicine)  PLACE OF SERVICE:  [place] Directive information Does Patient Have a Medical Advance Directive?: Yes, Type of Advance Directive: Out of facility DNR (pink MOST or yellow form), Pre-existing out of facility DNR order (yellow form or pink MOST form): Yellow form placed in chart (order not valid for inpatient use);Pink MOST form placed in chart (order not valid for inpatient use), Does patient want to make changes to medical advance directive?: No - Patient declined  No Known Allergies  Chief Complaint  Patient presents with   Medical Management of Chronic Issues    4 month follow up. Patient has rash underneath left breast she would like looked at.   Health Maintenance    Zoster vaccine, 2nd COVID vaccine     HPI: Patient is a 87 y.o. female for routine follow up.  Doing well.   Recent labs noted elevated calcium.   Osteoporosis- getting prolia today.   Some dizziness when she gets up so she changes positions slowly.   Does not check blood pressure at home.   Reports she does have some swelling to lower legs at times but overall minimal.  Blood pressure tends to run high in family so no salt.   Rash under breast. Itchy. No pain. Daughter noted last week.   Weight loss- reports she eats but less- Weight has trended down  Review of Systems:  Review of Systems  Constitutional:  Negative for chills, fever and weight loss.  HENT:  Negative for tinnitus.   Respiratory:  Negative for cough, sputum production and shortness of breath.   Cardiovascular:  Negative for chest pain, palpitations and leg swelling.  Gastrointestinal:  Negative for abdominal pain, constipation, diarrhea and heartburn.  Genitourinary:  Negative for dysuria, frequency and urgency.  Musculoskeletal:  Negative for back pain, falls, joint pain and myalgias.  Skin:  Positive for itching and rash.        Itchy rash under left breast  Neurological:  Negative for dizziness and headaches.  Psychiatric/Behavioral:  Negative for depression and memory loss. The patient does not have insomnia.    Past Medical History:  Diagnosis Date   Hyperlipidemia    Hypertension    Osteoporosis    Past Surgical History:  Procedure Laterality Date   ABDOMINAL HYSTERECTOMY  1998   Social History:   reports that she quit smoking about 18 years ago. She smoked an average of 1 pack per day. She has never used smokeless tobacco. She reports that she does not drink alcohol and does not use drugs.  Family History  Problem Relation Age of Onset   Cancer Father    Dementia Sister    Breast cancer Maternal Grandmother    Breast cancer Paternal Grandmother     Medications: Patient's Medications  New Prescriptions   No medications on file  Previous Medications   ASPIRIN 81 MG TABLET    Take 81 mg by mouth daily.   DONEPEZIL (ARICEPT) 5 MG TABLET    TAKE 1 TABLET(5 MG) BY MOUTH DAILY   FEEDING SUPPLEMENT (BOOST HIGH PROTEIN) LIQD    Take 1 Container by mouth daily.   HYDROCHLOROTHIAZIDE (HYDRODIURIL) 25 MG TABLET    TAKE 1 TABLET BY MOUTH EVERY DAY   LISINOPRIL (ZESTRIL) 5 MG TABLET    TAKE 1 TABLET(5 MG) BY MOUTH DAILY   SIMVASTATIN (ZOCOR) 20 MG TABLET    TAKE 1 TABLET  BY MOUTH EVERY DAY   VITAMIN D, ERGOCALCIFEROL, (DRISDOL) 1.25 MG (50000 UNIT) CAPS CAPSULE    TAKE 1 CAPSULE BY MOUTH 1 TIME A WEEK  Modified Medications   No medications on file  Discontinued Medications   No medications on file    Physical Exam:  Vitals:   06/21/21 0911  BP: 140/80  Pulse: 94  Temp: 97.6 F (36.4 C)  TempSrc: Temporal  SpO2: 98%  Weight: 138 lb (62.6 kg)  Height: 5\' 7"  (1.702 m)   Body mass index is 21.61 kg/m. Wt Readings from Last 3 Encounters:  06/21/21 138 lb (62.6 kg)  01/30/21 142 lb 11.2 oz (64.7 kg)  01/14/21 145 lb (65.8 kg)    Physical Exam Constitutional:      General: She is not in  acute distress.    Appearance: She is well-developed. She is not diaphoretic.  HENT:     Head: Normocephalic and atraumatic.     Mouth/Throat:     Pharynx: No oropharyngeal exudate.  Eyes:     Conjunctiva/sclera: Conjunctivae normal.     Pupils: Pupils are equal, round, and reactive to light.  Cardiovascular:     Rate and Rhythm: Normal rate and regular rhythm.     Heart sounds: Normal heart sounds.  Pulmonary:     Effort: Pulmonary effort is normal.     Breath sounds: Normal breath sounds.  Abdominal:     General: Bowel sounds are normal.     Palpations: Abdomen is soft.  Musculoskeletal:     Cervical back: Normal range of motion and neck supple.     Right lower leg: No edema.     Left lower leg: No edema.  Skin:    General: Skin is warm and dry.     Comments: no mass or tenderness on palpitation to left breast. Raw area noted, surrounded by crusted lesions.   Neurological:     Mental Status: She is alert.  Psychiatric:        Mood and Affect: Mood normal.    Labs reviewed: Basic Metabolic Panel: Recent Labs    10/03/20 0936 01/16/21 0914 05/29/21 1108  NA 137 137 137  K 3.9 3.7 3.9  CL 104 103 103  CO2 26 26 26   GLUCOSE 89 84 74  BUN 23 24* 22  CREATININE 1.01* 1.10* 1.06*  CALCIUM 10.5* 10.2 10.7*   Liver Function Tests: Recent Labs    10/03/20 0936 01/16/21 0914 05/29/21 1108  AST 15 17 15   ALT 8 10 7   ALKPHOS 54 59 47  BILITOT 0.3 0.4 0.3  PROT 11.1* 11.5* 10.3*  ALBUMIN 3.4* 3.5 3.2*   No results for input(s): LIPASE, AMYLASE in the last 8760 hours. No results for input(s): AMMONIA in the last 8760 hours. CBC: Recent Labs    10/03/20 0936 01/16/21 0914 05/29/21 1108  WBC 4.0 4.6 3.7*  NEUTROABS 2.3 2.5 2.0  HGB 11.4* 11.3* 10.4*  HCT 36.7 35.9* 32.8*  MCV 86.6 86.3 85.2  PLT 215 226 214   Lipid Panel: No results for input(s): CHOL, HDL, LDLCALC, TRIG, CHOLHDL, LDLDIRECT in the last 8760 hours. TSH: No results for input(s): TSH in the  last 8760 hours. A1C: No results found for: HGBA1C   Assessment/Plan 1. Osteoporosis, unspecified osteoporosis type, unspecified pathological fracture presence -continues prolia - Cholecalciferol (VITAMIN D3) 25 MCG (1000 UT) CAPS; Take 1 capsule (1,000 Units total) by mouth daily.  Dispense: 90 capsule; Refill: 1  2. HYPERTENSION, BENIGN ESSENTIAL -controlled  at this time. Will stop hctz due to elevated calcium.  -increase lisinopril. -continue low sodium diet.  - lisinopril (ZESTRIL) 10 MG tablet; Take 1 tablet (10 mg total) by mouth daily.  Dispense: 30 tablet; Refill: 1  3. Dementia without behavioral disturbance, unspecified dementia type ([place]) -stable at this time. Continues to live alone and function well. Daughter helps her.   4. Vitamin D deficiency Vit d elevated on last labs. Stop Vit D 50000 units and start vit d 1000 units daily - Cholecalciferol (VITAMIN D3) 25 MCG (1000 UT) CAPS; Take 1 capsule (1,000 Units total) by mouth daily.  Dispense: 90 capsule; Refill: 1  5. Smoldering myeloma ([facility]) -stable. Followed by oncologist.   6. Mixed hyperlipidemia -continues on simvastatin, will get fasting labs at next appt.   7. Hypercalcemia -will stop HCTZ as this can contribute. She is not on any calcium supplements.   8. Anemia, unspecified type Ongoing. Followed by oncologist, stable at this time.   10. Skin lesion -under left breast, daughter noticed worsening of area last week.  - Ambulatory referral to Dermatology  Next appt: 4 weeks for bp and fasting labs Neev Mcmains K. McMurray, [facility] [phone]

## 2021-06-24 ENCOUNTER — Ambulatory Visit: Payer: Medicare Other

## 2021-06-26 ENCOUNTER — Other Ambulatory Visit: Payer: Self-pay | Admitting: *Deleted

## 2021-06-26 MED ORDER — DENOSUMAB 60 MG/ML ~~LOC~~ SOSY: 60.0000 mg | PREFILLED_SYRINGE | SUBCUTANEOUS | 0 refills | Status: DC

## 2021-06-26 NOTE — Telephone Encounter (Signed)
Jasmine Fletcher requested refill to be sent to pharmacy.

## 2021-07-16 DIAGNOSIS — D045 Carcinoma in situ of skin of trunk: Secondary | ICD-10-CM | POA: Diagnosis not present

## 2021-07-22 ENCOUNTER — Other Ambulatory Visit: Payer: Self-pay

## 2021-07-22 ENCOUNTER — Ambulatory Visit (INDEPENDENT_AMBULATORY_CARE_PROVIDER_SITE_OTHER): Payer: Medicare Other | Admitting: Nurse Practitioner

## 2021-07-22 ENCOUNTER — Encounter: Payer: Self-pay | Admitting: Nurse Practitioner

## 2021-07-22 VITALS — BP 138/84 | HR 58 | Temp 97.6°F | Ht 67.0 in | Wt 136.0 lb

## 2021-07-22 DIAGNOSIS — M810 Age-related osteoporosis without current pathological fracture: Secondary | ICD-10-CM

## 2021-07-22 DIAGNOSIS — L989 Disorder of the skin and subcutaneous tissue, unspecified: Secondary | ICD-10-CM

## 2021-07-22 DIAGNOSIS — I10 Essential (primary) hypertension: Secondary | ICD-10-CM

## 2021-07-22 DIAGNOSIS — E782 Mixed hyperlipidemia: Secondary | ICD-10-CM | POA: Diagnosis not present

## 2021-07-22 MED ORDER — DENOSUMAB 60 MG/ML ~~LOC~~ SOSY
60.0000 mg | PREFILLED_SYRINGE | Freq: Once | SUBCUTANEOUS | Status: AC
  Administered 2021-07-22: 60 mg via SUBCUTANEOUS

## 2021-07-22 NOTE — Progress Notes (Signed)
Careteam: Patient Care Team: Lauree Chandler, NP as PCP - General (Geriatric Medicine)  PLACE OF SERVICE:  [facility] Directive information Does Patient Have a Medical Advance Directive?: Yes, Type of Advance Directive: Out of facility DNR (pink MOST or yellow form), Pre-existing out of facility DNR order (yellow form or pink MOST form): Yellow form placed in chart (order not valid for inpatient use);Pink MOST form placed in chart (order not valid for inpatient use), Does patient want to make changes to medical advance directive?: No - Patient declined  No Known Allergies  Chief Complaint  Patient presents with   Follow-up    4 week follow-up and prolia injection. Discuss need for shingrix and covid #4 or exclude. See dermatologist and they examined area on left breast of concern, patient questions if Janett Billow may need to recheck also.      HPI: Patient is a 87 y.o. female for blood pressure follow up.  Reports she is still taking the HCTZ.  She was supposed to increase lisinopril to 10 mg but unsure if she is doing this.   Took a biopsy from the lesion on the breast. Had some pain after biopsy but healing well at this time.   No acute concerns noted today  Review of Systems:  Review of Systems  Constitutional:  Negative for chills, fever and weight loss.  HENT:  Negative for tinnitus.   Respiratory:  Negative for cough, sputum production and shortness of breath.   Cardiovascular:  Negative for chest pain, palpitations and leg swelling.  Gastrointestinal:  Negative for abdominal pain, constipation, diarrhea and heartburn.  Genitourinary:  Negative for dysuria, frequency and urgency.  Musculoskeletal:  Negative for back pain, falls, joint pain and myalgias.  Neurological:  Negative for dizziness and headaches.  Psychiatric/Behavioral:  Negative for depression and memory loss. The patient does not have insomnia.    Past Medical History:  Diagnosis Date    Hyperlipidemia    Hypertension    Osteoporosis    Past Surgical History:  Procedure Laterality Date   ABDOMINAL HYSTERECTOMY  1998   Social History:   reports that she quit smoking about 18 years ago. Her smoking use included cigarettes. She smoked an average of 1 pack per day. She has never used smokeless tobacco. She reports that she does not drink alcohol and does not use drugs.  Family History  Problem Relation Age of Onset   Cancer Father    Dementia Sister    Breast cancer Maternal Grandmother    Breast cancer Paternal Grandmother     Medications: Patient's Medications  New Prescriptions   No medications on file  Previous Medications   ASPIRIN 81 MG TABLET    Take 81 mg by mouth daily.   CHOLECALCIFEROL (VITAMIN D3) 25 MCG (1000 UT) CAPS    Take 1 capsule (1,000 Units total) by mouth daily.   DENOSUMAB (PROLIA) 60 MG/ML SOSY INJECTION    Inject 60 mg into the skin every 6 (six) months.   DONEPEZIL (ARICEPT) 5 MG TABLET    TAKE 1 TABLET(5 MG) BY MOUTH DAILY   FEEDING SUPPLEMENT (BOOST HIGH PROTEIN) LIQD    Take 1 Container by mouth daily.   LISINOPRIL (ZESTRIL) 10 MG TABLET    Take 1 tablet (10 mg total) by mouth daily.   SIMVASTATIN (ZOCOR) 20 MG TABLET    TAKE 1 TABLET BY MOUTH EVERY DAY  Modified Medications   No medications on file  Discontinued Medications  No medications on file    Physical Exam:  Vitals:   07/22/21 0855  BP: 138/84  Pulse: (!) 58  Temp: 97.6 F (36.4 C)  TempSrc: Temporal  SpO2: 96%  Weight: 136 lb (61.7 kg)  Height: '5\' 7"'$  (1.702 m)   Body mass index is 21.3 kg/m. Wt Readings from Last 3 Encounters:  07/22/21 136 lb (61.7 kg)  06/21/21 138 lb (62.6 kg)  01/30/21 142 lb 11.2 oz (64.7 kg)    Physical Exam Constitutional:      General: She is not in acute distress.    Appearance: She is well-developed. She is not diaphoretic.  HENT:     Head: Normocephalic and atraumatic.     Mouth/Throat:     Pharynx: No oropharyngeal  exudate.  Eyes:     Conjunctiva/sclera: Conjunctivae normal.     Pupils: Pupils are equal, round, and reactive to light.  Cardiovascular:     Rate and Rhythm: Normal rate and regular rhythm.     Heart sounds: Normal heart sounds.  Pulmonary:     Effort: Pulmonary effort is normal.     Breath sounds: Normal breath sounds.  Abdominal:     General: Bowel sounds are normal.     Palpations: Abdomen is soft.  Musculoskeletal:     Cervical back: Normal range of motion and neck supple.     Right lower leg: No edema.     Left lower leg: No edema.  Skin:    General: Skin is warm and dry.     Findings: Lesion (noted under left breast, slight redness, biopsy site noted) present.  Neurological:     Mental Status: She is alert.  Psychiatric:        Mood and Affect: Mood normal.    Labs reviewed: Basic Metabolic Panel: Recent Labs    10/03/20 0936 01/16/21 0914 05/29/21 1108  NA 137 137 137  K 3.9 3.7 3.9  CL 104 103 103  CO2 '26 26 26  '$ GLUCOSE 89 84 74  BUN 23 24* 22  CREATININE 1.01* 1.10* 1.06*  CALCIUM 10.5* 10.2 10.7*   Liver Function Tests: Recent Labs    10/03/20 0936 01/16/21 0914 05/29/21 1108  AST '15 17 15  '$ ALT '8 10 7  '$ ALKPHOS 54 59 47  BILITOT 0.3 0.4 0.3  PROT 11.1* 11.5* 10.3*  ALBUMIN 3.4* 3.5 3.2*   No results for input(s): LIPASE, AMYLASE in the last 8760 hours. No results for input(s): AMMONIA in the last 8760 hours. CBC: Recent Labs    10/03/20 0936 01/16/21 0914 05/29/21 1108  WBC 4.0 4.6 3.7*  NEUTROABS 2.3 2.5 2.0  HGB 11.4* 11.3* 10.4*  HCT 36.7 35.9* 32.8*  MCV 86.6 86.3 85.2  PLT 215 226 214   Lipid Panel: No results for input(s): CHOL, HDL, LDLCALC, TRIG, CHOLHDL, LDLDIRECT in the last 8760 hours. TSH: No results for input(s): TSH in the last 8760 hours. A1C: No results found for: HGBA1C   Assessment/Plan 1. Osteoporosis, unspecified osteoporosis type, unspecified pathological fracture presence - denosumab (PROLIA) injection 60  mg given today  2. Essential hypertension, benign -well controlled however she is not sure if she is still taking the HCTZ. Her daughter is going to go home and check medication. If she is taking she will take away from medication.  - BASIC METABOLIC PANEL WITH GFR  3. Hypercalcemia -thought to be due to HCTZ, will follow up however she is not sure if she is still taking or not. -  BASIC METABOLIC PANEL WITH GFR  4. Mixed hyperlipidemia -continues on simvastatin.  - BASIC METABOLIC PANEL WITH GFR - Lipid panel  5. Lesion Biopsy done by dermatology. Xeroform dressing applied.   Next appt: 2 weeks for bp [facility]. [place], [facility] [phone]

## 2021-07-22 NOTE — Patient Instructions (Addendum)
Schedule next prolia injection for 01/23/2022 or after   To make sure you have stopped the hydrochlorothiazide Should be taking lisinopril 10 mg daily

## 2021-07-23 ENCOUNTER — Other Ambulatory Visit: Payer: Self-pay | Admitting: Nurse Practitioner

## 2021-07-23 DIAGNOSIS — I10 Essential (primary) hypertension: Secondary | ICD-10-CM

## 2021-07-23 LAB — BASIC METABOLIC PANEL WITH GFR
BUN/Creatinine Ratio: 26 (calc) — ABNORMAL HIGH (ref 6–22)
BUN: 29 mg/dL — ABNORMAL HIGH (ref 7–25)
CO2: 29 mmol/L (ref 20–32)
Calcium: 10.2 mg/dL (ref 8.6–10.4)
Chloride: 105 mmol/L (ref 98–110)
Creat: 1.12 mg/dL — ABNORMAL HIGH (ref 0.60–0.95)
Glucose, Bld: 76 mg/dL (ref 65–99)
Potassium: 3.9 mmol/L (ref 3.5–5.3)
Sodium: 139 mmol/L (ref 135–146)
eGFR: 47 mL/min/{1.73_m2} — ABNORMAL LOW (ref >=60)

## 2021-07-23 LAB — LIPID PANEL
Cholesterol: 139 mg/dL (ref <200)
HDL: 43 mg/dL — ABNORMAL LOW (ref >=50)
LDL Cholesterol (Calc): 79 mg/dL (calc)
Non-HDL Cholesterol (Calc): 96 mg/dL (calc) (ref <130)
Total CHOL/HDL Ratio: 3.2 (calc) (ref <5.0)
Triglycerides: 91 mg/dL (ref <150)

## 2021-08-06 ENCOUNTER — Encounter: Payer: Medicare Other | Admitting: Nurse Practitioner

## 2021-08-07 ENCOUNTER — Encounter: Payer: Self-pay | Admitting: Nurse Practitioner

## 2021-08-07 ENCOUNTER — Ambulatory Visit (INDEPENDENT_AMBULATORY_CARE_PROVIDER_SITE_OTHER): Payer: Medicare Other | Admitting: Nurse Practitioner

## 2021-08-07 ENCOUNTER — Other Ambulatory Visit: Payer: Self-pay

## 2021-08-07 VITALS — BP 144/78 | HR 54 | Temp 97.9°F | Ht 66.5 in | Wt 140.0 lb

## 2021-08-07 DIAGNOSIS — E2839 Other primary ovarian failure: Secondary | ICD-10-CM | POA: Diagnosis not present

## 2021-08-07 DIAGNOSIS — Z23 Encounter for immunization: Secondary | ICD-10-CM | POA: Diagnosis not present

## 2021-08-07 DIAGNOSIS — Z0000 Encounter for general adult medical examination without abnormal findings: Secondary | ICD-10-CM | POA: Diagnosis not present

## 2021-08-07 NOTE — Patient Instructions (Signed)
Jasmine Fletcher , Thank you for taking time to come for your Medicare Wellness Visit. I appreciate your ongoing commitment to your health goals. Please review the following plan we discussed and let me know if I can assist you in the future.   Screening recommendations/referrals: Colonoscopy aged out Mammogram aged out Bone Density due end of September  Recommended yearly ophthalmology/optometry visit for glaucoma screening and checkup Recommended yearly dental visit for hygiene and checkup  Vaccinations: Influenza vaccine done today Pneumococcal vaccine up tod ate Tdap vaccine up to date Shingles vaccine RECOMMENDED- to get at [place] to get at local pharmacy    Advanced directives: on file.   Conditions/risks identified: advance age.  Next appointment: 1 year    Preventive Care 42 Years and Older, Female Preventive care refers to lifestyle choices and visits with your health care provider that can promote health and wellness. What does preventive care include? A yearly physical exam. This is also called an annual well check. Dental exams once or twice a year. Routine eye exams. Ask your health care provider how often you should have your eyes checked. Personal lifestyle choices, including: Daily care of your teeth and gums. Regular physical activity. Eating a healthy diet. Avoiding tobacco and drug use. Limiting alcohol use. Practicing safe sex. Taking low-dose aspirin every day. Taking vitamin and mineral supplements as recommended by your health care provider. What happens during an annual well check? The services and screenings done by your health care provider during your annual well check will depend on your age, overall health, lifestyle risk factors, and family history of disease. Counseling  Your health care provider may ask you questions about your: Alcohol use. Tobacco use. Drug use. Emotional well-being. Home and relationship  well-being. Sexual activity. Eating habits. History of falls. Memory and ability to understand (cognition). Work and work Statistician. Reproductive health. Screening  You may have the following tests or measurements: Height, weight, and BMI. Blood pressure. Lipid and cholesterol levels. These may be checked every 5 years, or more frequently if you are over 91 years old. Skin check. Lung cancer screening. You may have this screening every year starting at age 29 if you have a 30-pack-year history of smoking and currently smoke or have quit within the past 15 years. Fecal occult blood test (FOBT) of the stool. You may have this test every year starting at age 32. Flexible sigmoidoscopy or colonoscopy. You may have a sigmoidoscopy every 5 years or a colonoscopy every 10 years starting at age 67. Hepatitis C blood test. Hepatitis B blood test. Sexually transmitted disease (STD) testing. Diabetes screening. This is done by checking your blood sugar (glucose) after you have not eaten for a while (fasting). You may have this done every 1-3 years. Bone density scan. This is done to screen for osteoporosis. You may have this done starting at age 37. Mammogram. This may be done every 1-2 years. Talk to your health care provider about how often you should have regular mammograms. Talk with your health care provider about your test results, treatment options, and if necessary, the need for more tests. Vaccines  Your health care provider may recommend certain vaccines, such as: Influenza vaccine. This is recommended every year. Tetanus, diphtheria, and acellular pertussis (Tdap, Td) vaccine. You may need a Td booster every 10 years. Zoster vaccine. You may need this after age 35. Pneumococcal 13-valent conjugate (PCV13) vaccine. One dose is recommended after age 1. Pneumococcal polysaccharide (PPSV23) vaccine. One  dose is recommended after age 15. Talk to your health care provider about which  screenings and vaccines you need and how often you need them. This information is not intended to replace advice given to you by your health care provider. Make sure you discuss any questions you have with your health care provider. Document Released: 12/21/2015 Document Revised: 08/13/2016 Document Reviewed: 09/25/2015 Elsevier Interactive Patient Education  2017 [place] Prevention in the Home Falls can cause injuries. They can happen to people of all ages. There are many things you can do to make your home safe and to help prevent falls. What can I do on the outside of my home? Regularly fix the edges of walkways and driveways and fix any cracks. Remove anything that might make you trip as you walk through a door, such as a raised step or threshold. Trim any bushes or trees on the path to your home. Use bright outdoor lighting. Clear any walking paths of anything that might make someone trip, such as rocks or tools. Regularly check to see if handrails are loose or broken. Make sure that both sides of any steps have handrails. Any raised decks and porches should have guardrails on the edges. Have any leaves, snow, or ice cleared regularly. Use sand or salt on walking paths during winter. Clean up any spills in your garage right away. This includes oil or grease spills. What can I do in the bathroom? Use night lights. Install grab bars by the toilet and in the tub and shower. Do not use towel bars as grab bars. Use non-skid mats or decals in the tub or shower. If you need to sit down in the shower, use a plastic, non-slip stool. Keep the floor dry. Clean up any water that spills on the floor as soon as it happens. Remove soap buildup in the tub or shower regularly. Attach bath mats securely with double-sided non-slip rug tape. Do not have throw rugs and other things on the floor that can make you trip. What can I do in the bedroom? Use night lights. Make sure that you have a  light by your bed that is easy to reach. Do not use any sheets or blankets that are too big for your bed. They should not hang down onto the floor. Have a firm chair that has side arms. You can use this for support while you get dressed. Do not have throw rugs and other things on the floor that can make you trip. What can I do in the kitchen? Clean up any spills right away. Avoid walking on wet floors. Keep items that you use a lot in easy-to-reach places. If you need to reach something above you, use a strong step stool that has a grab bar. Keep electrical cords out of the way. Do not use floor polish or wax that makes floors slippery. If you must use wax, use non-skid floor wax. Do not have throw rugs and other things on the floor that can make you trip. What can I do with my stairs? Do not leave any items on the stairs. Make sure that there are handrails on both sides of the stairs and use them. Fix handrails that are broken or loose. Make sure that handrails are as long as the stairways. Check any carpeting to make sure that it is firmly attached to the stairs. Fix any carpet that is loose or worn. Avoid having throw rugs at the top or bottom of the  stairs. If you do have throw rugs, attach them to the floor with carpet tape. Make sure that you have a light switch at the top of the stairs and the bottom of the stairs. If you do not have them, ask someone to add them for you. What else can I do to help prevent falls? Wear shoes that: Do not have high heels. Have rubber bottoms. Are comfortable and fit you well. Are closed at the toe. Do not wear sandals. If you use a stepladder: Make sure that it is fully opened. Do not climb a closed stepladder. Make sure that both sides of the stepladder are locked into place. Ask someone to hold it for you, if possible. Clearly mark and make sure that you can see: Any grab bars or handrails. First and last steps. Where the edge of each step  is. Use tools that help you move around (mobility aids) if they are needed. These include: Canes. Walkers. Scooters. Crutches. Turn on the lights when you go into a dark area. Replace any light bulbs as soon as they burn out. Set up your furniture so you have a clear path. Avoid moving your furniture around. If any of your floors are uneven, fix them. If there are any pets around you, be aware of where they are. Review your medicines with your doctor. Some medicines can make you feel dizzy. This can increase your chance of falling. Ask your doctor what other things that you can do to help prevent falls. This information is not intended to replace advice given to you by your health care provider. Make sure you discuss any questions you have with your health care provider. Document Released: 09/20/2009 Document Revised: 05/01/2016 Document Reviewed: 12/29/2014 Elsevier Interactive Patient Education  2017 [employer].

## 2021-08-07 NOTE — Progress Notes (Signed)
Subjective:   Jasmine Fletcher is a 87 y.o. female who presents for Medicare Annual (Subsequent) preventive examination.  Review of Systems     Cardiac Risk Factors include: advanced age (>43mn, >>39women);sedentary lifestyle;hypertension;dyslipidemia     Objective:    Today's Vitals   08/07/21 0928  BP: (!) 144/78  Pulse: (!) 54  Temp: 97.9 F (36.6 C)  TempSrc: Temporal  SpO2: 98%  Weight: 140 lb (63.5 kg)  Height: 5' 6.5" (1.689 m)   Body mass index is 22.26 kg/m.  Advanced Directives 08/07/2021 07/22/2021 06/21/2021 01/14/2021 10/19/2020 10/19/2020 06/20/2020  Does Patient Have a Medical Advance Directive? Yes Yes Yes Yes Yes Yes Yes  Type of Advance Directive Out of facility DNR (pink MOST or yellow form) Out of facility DNR (pink MOST or yellow form) Out of facility DNR (pink MOST or yellow form) Out of facility DNR (pink MOST or yellow form) Out of facility DNR (pink MOST or yellow form) Out of facility DNR (pink MOST or yellow form) Out of facility DNR (pink MOST or yellow form)  Does patient want to make changes to medical advance directive? No - Patient declined No - Patient declined No - Patient declined No - Patient declined No - Patient declined No - Patient declined No - Patient declined  Copy of [place] CAlpha Would patient like information on creating a medical advance directive? - - - - - - No - Patient declined  Pre-existing out of facility DNR order (yellow form or pink MOST form) - Yellow form placed in chart (order not valid for inpatient use);Pink MOST form placed in chart (order not valid for inpatient use) Yellow form placed in chart (order not valid for inpatient use);Pink MOST form placed in chart (order not valid for inpatient use) Yellow form placed in chart (order not valid for inpatient use);Pink MOST form placed in chart (order not valid for inpatient use) Yellow form placed in chart (order not valid for inpatient  use);Pink MOST form placed in chart (order not valid for inpatient use) Yellow form placed in chart (order not valid for inpatient use);Pink MOST form placed in chart (order not valid for inpatient use) -    Current Medications (verified) Outpatient Encounter Medications as of 08/07/2021  Medication Sig   aspirin 81 MG tablet Take 81 mg by mouth daily.   Cholecalciferol (VITAMIN D3) 25 MCG (1000 UT) CAPS Take 1 capsule (1,000 Units total) by mouth daily.   denosumab (PROLIA) 60 MG/ML SOSY injection Inject 60 mg into the skin every 6 (six) months.   donepezil (ARICEPT) 5 MG tablet TAKE 1 TABLET(5 MG) BY MOUTH DAILY   feeding supplement (BOOST HIGH PROTEIN) LIQD Take 1 Container by mouth daily.   lisinopril (ZESTRIL) 10 MG tablet TAKE 1 TABLET(10 MG) BY MOUTH DAILY   simvastatin (ZOCOR) 20 MG tablet TAKE 1 TABLET BY MOUTH EVERY DAY   No facility-administered encounter medications on file as of 08/07/2021.    Allergies (verified) Patient has no known allergies.   History: Past Medical History:  Diagnosis Date   Hyperlipidemia    Hypertension    Osteoporosis    Past Surgical History:  Procedure Laterality Date   ABDOMINAL HYSTERECTOMY  1998   Family History  Problem Relation Age of Onset   Cancer Father    Dementia Sister    Breast cancer Maternal Grandmother    Breast cancer Paternal Grandmother  Social History   Socioeconomic History   Marital status: Single    Spouse name: Not on file   Number of children: Not on file   Years of education: Not on file   Highest education level: Not on file  Occupational History   Not on file  Tobacco Use   Smoking status: Former    Packs/day: 1.00    Types: Cigarettes    Quit date: 12/08/2002    Years since quitting: 18.6   Smokeless tobacco: Never   Tobacco comments:    Quit t age 63   Vaping Use   Vaping Use: Never used  Substance and Sexual Activity   Alcohol use: No    Alcohol/week: 0.0 standard drinks   Drug use: No    Sexual activity: Not Currently  Other Topics Concern   Not on file  Social History Narrative   ** Merged History Encounter **       Social Determinants of Health   Financial Resource Strain: Not on file  Food Insecurity: Not on file  Transportation Needs: Not on file  Physical Activity: Not on file  Stress: Not on file  Social Connections: Not on file    Tobacco Counseling Counseling given: Not Answered Tobacco comments: Quit t age 69    Clinical Intake:  Pre-visit preparation completed: Yes  Pain : No/denies pain     BMI - recorded: 22 Nutritional Risks: Non-healing wound Diabetes: No  How often do you need to have someone help you when you read instructions, pamphlets, or other written materials from your doctor or pharmacy?: 5 - Always  Diabetic?no         Activities of Daily Living In your present state of health, do you have any difficulty performing the following activities: 08/07/2021  Hearing? N  Vision? Y  Comment needs cataract removed  Difficulty concentrating or making decisions? Y  Walking or climbing stairs? Y  Comment difficult with stairs  Dressing or bathing? N  Doing errands, shopping? Y  Comment does not Physiological scientist and eating ? N  Using the Toilet? N  In the past six months, have you accidently leaked urine? N  Do you have problems with loss of bowel control? N  Managing your Medications? Y  Managing your Finances? Y  Housekeeping or managing your Housekeeping? Y  Some recent data might be hidden    Patient Care Team: Lauree Chandler, NP as PCP - General (Geriatric Medicine)  Indicate any recent Medical Services you may have received from other than [facility] providers in the past year (date may be approximate).     Assessment:   This is a routine wellness examination for [place].  Hearing/Vision screen Hearing Screening - Comments:: No hearing issues  Vision Screening - Comments:: Last eye exam 11/2020, goes to the  eye doctor connected with [facility] in [facility].   Dietary issues and exercise activities discussed: Current Exercise Habits: Home exercise routine, Type of exercise: walking, Time (Minutes): 15, Frequency (Times/Week): 3, Weekly Exercise (Minutes/Week): 45   Goals Addressed   None    Depression Screen PHQ 2/9 Scores 08/07/2021 01/14/2021 10/19/2020 10/17/2019 06/14/2019 03/23/2019 08/06/2018  PHQ - 2 Score 0 0 0 0 0 0 0  PHQ- 9 Score - - - - - - -    Fall Risk Fall Risk  08/07/2021 07/22/2021 01/14/2021 10/19/2020 06/14/2020  Falls in the past year? 0 0 0 0 0  Number falls in past yr: 0 0  0 0 0  Injury with Fall? 0 0 0 0 0  Risk for fall due to : No Fall Risks No Fall Risks - - -  Follow up Falls evaluation completed Falls evaluation completed - - -    FALL RISK PREVENTION PERTAINING TO THE HOME:  Any stairs in or around the home? No  If so, are there any without handrails? No  Home free of loose throw rugs in walkways, pet beds, electrical cords, etc? Yes  Adequate lighting in your home to reduce risk of falls? Yes   ASSISTIVE DEVICES UTILIZED TO PREVENT FALLS:  Life alert? No  Use of a cane, walker or w/c? No  Grab bars in the bathroom? Yes  Shower chair or bench in shower? Yes  Elevated toilet seat or a handicapped toilet? Yes   TIMED UP AND GO:  Was the test performed? No .   Gait slow and steady without use of assistive device  Cognitive Function: MMSE - Mini Mental State Exam 08/07/2021 06/14/2019 06/04/2018 05/27/2017 03/14/2016  Not completed: - - - - (No Data)  Orientation to time '5 5 5 5 5  '$ Orientation to Place '5 4 5 5 5  '$ Registration '3 3 3 3 3  '$ Attention/ Calculation '2 2 3 '$ 0 4  Recall '2 2 2 3 2  '$ Language- name 2 objects '2 2 2 2 2  '$ Language- repeat '1 1 1 1 1  '$ Language- follow 3 step command '3 3 3 3 3  '$ Language- read & follow direction '1 1 1 1 1  '$ Write a sentence '1 1 1 1 1  '$ Copy design 0 0 0 0 0  Total score '25 24 26 24 27     '$ 6CIT Screen 06/14/2020  What  Year? 0 points  What month? 0 points  What time? 0 points  Count back from 20 4 points  Months in reverse 4 points  Repeat phrase 10 points  Total Score 18    Immunizations Immunization History  Administered Date(s) Administered   Fluad Quad(high Dose 65+) 10/17/2019, 10/19/2020, 08/07/2021   Influenza Whole 12/24/2006   Influenza,inj,Quad PF,6+ Mos 01/30/2015, 10/02/2017, 08/06/2018   Influenza-Unspecified 09/18/2011, 08/26/2013, 09/29/2016   PFIZER(Purple Top)SARS-COV-2 Vaccination 01/14/2020, 02/04/2020, 10/06/2020   Pneumococcal Conjugate-13 07/25/2014   Pneumococcal Polysaccharide-23 12/08/1996, 10/16/2011   Td 11/05/2005   Tdap 10/16/2011    TDAP status: Up to date  Flu Vaccine status: Completed at today's visit  Pneumococcal vaccine status: Up to date  Covid-19 vaccine status: Information provided on how to obtain vaccines.   Qualifies for Shingles Vaccine? Yes   Zostavax completed No   Shingrix Completed?: No.    Education has been provided regarding the importance of this vaccine. Patient has been advised to call insurance company to determine out of pocket expense if they have not yet received this vaccine. Advised may also receive vaccine at local pharmacy or Health Dept. Verbalized acceptance and understanding.  Screening Tests Health Maintenance  Topic Date Due   Zoster Vaccines- Shingrix (1 of 2) Never done   COVID-19 Vaccine (4 - Booster for Pfizer series) 01/06/2021   TETANUS/TDAP  10/15/2021   INFLUENZA VACCINE  Completed   DEXA SCAN  Completed   PNA vac Low Risk Adult  Completed   HPV VACCINES  Aged Out    Health Maintenance  Health Maintenance Due  Topic Date Due   Zoster Vaccines- Shingrix (1 of 2) Never done   COVID-19 Vaccine (4 - Booster for [employer] series) 01/06/2021  Colorectal cancer screening: No longer required.   Mammogram status: No longer required due to aged out.  Bone Density status: Completed 09/06/19. Results reflect: Bone  density results: OSTEOPOROSIS. Repeat every 2 years.  Lung Cancer Screening: (Low Dose CT Chest recommended if Age 33-80 years, 30 pack-year currently smoking OR have quit w/in 15years.) does not qualify.   Additional Screening:  Hepatitis C Screening: does not qualify  Vision Screening: Recommended annual ophthalmology exams for early detection of glaucoma and other disorders of the eye. Is the patient up to date with their annual eye exam?  Yes  Who is the provider or what is the name of the office in which the patient attends annual eye exams? [facility] If pt is not established with a provider, would they like to be referred to a provider to establish care? No .   Dental Screening: Recommended annual dental exams for proper oral hygiene  Community Resource Referral / Chronic Care Management: CRR required this visit?  No   CCM required this visit?  No      Plan:     I have personally reviewed and noted the following in the patient's chart:   Medical and social history Use of alcohol, tobacco or illicit drugs  Current medications and supplements including opioid prescriptions.  Functional ability and status Nutritional status Physical activity Advanced directives List of other physicians Hospitalizations, surgeries, and ER visits in previous 12 months Vitals Screenings to include cognitive, depression, and falls Referrals and appointments  In addition, I have reviewed and discussed with patient certain preventive protocols, quality metrics, and best practice recommendations. A written personalized care plan for preventive services as well as general preventive health recommendations were provided to patient.     Lauree Chandler, NP   08/07/2021

## 2021-08-16 DIAGNOSIS — D045 Carcinoma in situ of skin of trunk: Secondary | ICD-10-CM | POA: Diagnosis not present

## 2021-08-27 ENCOUNTER — Telehealth: Payer: Self-pay | Admitting: *Deleted

## 2021-08-27 DIAGNOSIS — I10 Essential (primary) hypertension: Secondary | ICD-10-CM

## 2021-08-27 MED ORDER — LISINOPRIL 10 MG PO TABS: ORAL_TABLET | ORAL | 1 refills | Status: DC

## 2021-08-27 NOTE — Telephone Encounter (Signed)
Received fax from [facility] [phone] stating:  "Patient was unsure if she is continuing Lisinopril 10mg  medication , she was under the impression this medication was to be discontinued. Please Advise. We have a new Script sent from 07/23/2021 which has not been filled yet."  Please Advise.

## 2021-08-27 NOTE — Telephone Encounter (Signed)
She should be taking lisinopril 10 mg daily   And stopped the hydrochlorothiazide

## 2021-09-06 DIAGNOSIS — Z08 Encounter for follow-up examination after completed treatment for malignant neoplasm: Secondary | ICD-10-CM | POA: Diagnosis not present

## 2021-09-06 DIAGNOSIS — Z85828 Personal history of other malignant neoplasm of skin: Secondary | ICD-10-CM | POA: Diagnosis not present

## 2021-09-13 DIAGNOSIS — C44529 Squamous cell carcinoma of skin of other part of trunk: Secondary | ICD-10-CM | POA: Diagnosis not present

## 2021-10-01 ENCOUNTER — Other Ambulatory Visit: Payer: Self-pay

## 2021-10-01 ENCOUNTER — Inpatient Hospital Stay: Payer: Medicare Other | Attending: Hematology

## 2021-10-01 DIAGNOSIS — D472 Monoclonal gammopathy: Secondary | ICD-10-CM | POA: Insufficient documentation

## 2021-10-01 LAB — CMP (CANCER CENTER ONLY)
ALT: 8 U/L (ref 0–44)
AST: 15 U/L (ref 15–41)
Albumin: 3.3 g/dL — ABNORMAL LOW (ref 3.5–5.0)
Alkaline Phosphatase: 44 U/L (ref 38–126)
Anion gap: 5 (ref 5–15)
BUN: 17 mg/dL (ref 8–23)
CO2: 26 mmol/L (ref 22–32)
Calcium: 9.7 mg/dL (ref 8.9–10.3)
Chloride: 107 mmol/L (ref 98–111)
Creatinine: 0.94 mg/dL (ref 0.44–1.00)
GFR, Estimated: 58 mL/min — ABNORMAL LOW (ref >60)
Glucose, Bld: 88 mg/dL (ref 70–99)
Potassium: 4.2 mmol/L (ref 3.5–5.1)
Sodium: 138 mmol/L (ref 135–145)
Total Bilirubin: 0.4 mg/dL (ref 0.3–1.2)
Total Protein: 10.3 g/dL — ABNORMAL HIGH (ref 6.5–8.1)

## 2021-10-01 LAB — CBC WITH DIFFERENTIAL/PLATELET
Abs Immature Granulocytes: 0.01 10*3/uL (ref 0.00–0.07)
Basophils Absolute: 0.1 10*3/uL (ref 0.0–0.1)
Basophils Relative: 1 %
Eosinophils Absolute: 0 10*3/uL (ref 0.0–0.5)
Eosinophils Relative: 1 %
HCT: 34.3 % — ABNORMAL LOW (ref 36.0–46.0)
Hemoglobin: 10.6 g/dL — ABNORMAL LOW (ref 12.0–15.0)
Immature Granulocytes: 0 %
Lymphocytes Relative: 36 %
Lymphs Abs: 1.5 10*3/uL (ref 0.7–4.0)
MCH: 26.8 pg (ref 26.0–34.0)
MCHC: 30.9 g/dL (ref 30.0–36.0)
MCV: 86.6 fL (ref 80.0–100.0)
Monocytes Absolute: 0.6 10*3/uL (ref 0.1–1.0)
Monocytes Relative: 14 %
Neutro Abs: 2 10*3/uL (ref 1.7–7.7)
Neutrophils Relative %: 48 %
Platelets: 211 10*3/uL (ref 150–400)
RBC: 3.96 MIL/uL (ref 3.87–5.11)
RDW: 13.5 % (ref 11.5–15.5)
WBC: 4.2 10*3/uL (ref 4.0–10.5)
nRBC: 0 % (ref 0.0–0.2)

## 2021-10-03 LAB — MULTIPLE MYELOMA PANEL, SERUM
Albumin SerPl Elph-Mcnc: 3.8 g/dL (ref 2.9–4.4)
Albumin/Glob SerPl: 0.7 (ref 0.7–1.7)
Alpha 1: 0.2 g/dL (ref 0.0–0.4)
Alpha2 Glob SerPl Elph-Mcnc: 0.8 g/dL (ref 0.4–1.0)
B-Globulin SerPl Elph-Mcnc: 0.9 g/dL (ref 0.7–1.3)
Gamma Glob SerPl Elph-Mcnc: 3.8 g/dL — ABNORMAL HIGH (ref 0.4–1.8)
Globulin, Total: 5.7 g/dL — ABNORMAL HIGH (ref 2.2–3.9)
IgA: 69 mg/dL (ref 64–422)
IgG (Immunoglobin G), Serum: 4767 mg/dL — ABNORMAL HIGH (ref 586–1602)
IgM (Immunoglobulin M), Srm: 12 mg/dL — ABNORMAL LOW (ref 26–217)
M Protein SerPl Elph-Mcnc: 3.6 g/dL — ABNORMAL HIGH
Total Protein ELP: 9.5 g/dL — ABNORMAL HIGH (ref 6.0–8.5)

## 2021-10-08 ENCOUNTER — Inpatient Hospital Stay: Payer: Medicare Other | Attending: Hematology | Admitting: Hematology

## 2021-10-08 ENCOUNTER — Other Ambulatory Visit: Payer: Self-pay

## 2021-10-08 VITALS — BP 156/79 | HR 53 | Temp 98.5°F | Resp 18 | Ht 66.5 in | Wt 139.1 lb

## 2021-10-08 DIAGNOSIS — D472 Monoclonal gammopathy: Secondary | ICD-10-CM | POA: Diagnosis not present

## 2021-10-08 DIAGNOSIS — E8809 Other disorders of plasma-protein metabolism, not elsewhere classified: Secondary | ICD-10-CM | POA: Diagnosis not present

## 2021-10-09 ENCOUNTER — Telehealth: Payer: Self-pay | Admitting: Hematology

## 2021-10-09 NOTE — Telephone Encounter (Signed)
Scheduled follow-up appointments per 11/1 los. Patient's daughter is aware. 

## 2021-10-14 NOTE — Progress Notes (Signed)
HEMATOLOGY/ONCOLOGY NOTE  Date of Service: .10/08/2021   Patient Care Team: Lauree Chandler, NP as PCP - General (Geriatric Medicine)   CHIEF COMPLAINTS:  F/u for smoldering myeloma   HISTORY OF PRESENTING ILLNESS:   Jasmine Fletcher is a wonderful 87 y.o. female who has been referred to [place] by Lauree Chandler, NP for evaluation and management of elevated serum globulin levels. The pt reports that she is doing well overall.  The pt reports that her appetite has decreased recently but that she eats a healthy diet. She notes mild fatigue. Denies new bone pain, back pain, hip pain, infection concerns, and any other new concerns.  She has been getting a Prolia shot every 6 months for the past 2 years. She has also been taking Aricept but notes that her memory is worsening. Her daughter is her health care POA, but the pt makes her own healthcare decisions at this time.  Most recent lab results (06/14/2019) of CBC is as follows: all values are WNL except for HGB at 11.4, MCH at 26.8. 06/21/2019 UPEP revealed protein/creat ratio at 209, total protein at 29, and two abnormal protein bands 06/14/2019 SPEP revealed total protein at 10.1, Alpha 2 at 1.0, gamma globulin at 4.1, abnormal protein band1 at 3.9  On review of systems, pt reports some fatigue, decreased appetite, and denies new bone pain, back pain, hip pain, infection concerns, and any other symptoms.   On PMHx the pt reports that she is taking BP medication  On Social Hx the pt reports that she quit smoking in 2004 and does not drink alcohol   INTERVAL HISTORY:  Jasmine Fletcher is a 87 y.o. female here for evaluation and management of her plasma cell dyscrasia.  The patient's last visit with [place] was on 06/07/2021.   Patient reports some mild grade 1 fatigue and some intermittent headaches resolving with Tylenol but no other acute new symptoms. Reasonable p.o. intake though she feels it has been less than in the past  years.  Continues to drink boost.   Denies any new bone pains.  Lab results 10/01/2021 of CBC w/diff shows stable anemia with a hemoglobin of 10.6 previously 10.4 .  Normal WBC count of 4.2k and normal platelets of 211k CMP showed normal kidney function with creatinine of 0.94, normal calcium of 9.7.  Total protein of 10.3 which is unchanged.  MEDICAL HISTORY:  Past Medical History:  Diagnosis Date   Hyperlipidemia    Hypertension    Osteoporosis     SURGICAL HISTORY: Past Surgical History:  Procedure Laterality Date   ABDOMINAL HYSTERECTOMY  1998    SOCIAL HISTORY: Social History   Socioeconomic History   Marital status: Single    Spouse name: Not on file   Number of children: Not on file   Years of education: Not on file   Highest education level: Not on file  Occupational History   Not on file  Tobacco Use   Smoking status: Former    Packs/day: 1.00    Types: Cigarettes    Quit date: 12/08/2002    Years since quitting: 18.8   Smokeless tobacco: Never   Tobacco comments:    Quit t age 65   Vaping Use   Vaping Use: Never used  Substance and Sexual Activity   Alcohol use: No    Alcohol/week: 0.0 standard drinks   Drug use: No   Sexual activity: Not Currently  Other Topics Concern   Not on  file  Social History Narrative   ** Merged History Encounter **       Social Determinants of Health   Financial Resource Strain: Not on file  Food Insecurity: Not on file  Transportation Needs: Not on file  Physical Activity: Not on file  Stress: Not on file  Social Connections: Not on file  Intimate Partner Violence: Not on file    FAMILY HISTORY: Family History  Problem Relation Age of Onset   Cancer Father    Dementia Sister    Breast cancer Maternal Grandmother    Breast cancer Paternal Grandmother     ALLERGIES:  has No Known Allergies.  MEDICATIONS:  Current Outpatient Medications  Medication Sig Dispense Refill   aspirin 81 MG tablet Take 81 mg by  mouth daily.     Cholecalciferol (VITAMIN D3) 25 MCG (1000 UT) CAPS Take 1 capsule (1,000 Units total) by mouth daily. 90 capsule 1   denosumab (PROLIA) 60 MG/ML SOSY injection Inject 60 mg into the skin every 6 (six) months. 60 mL 0   donepezil (ARICEPT) 5 MG tablet TAKE 1 TABLET(5 MG) BY MOUTH DAILY 90 tablet 1   feeding supplement (BOOST HIGH PROTEIN) LIQD Take 1 Container by mouth daily.     lisinopril (ZESTRIL) 10 MG tablet TAKE 1 TABLET(10 MG) BY MOUTH DAILY 90 tablet 1   simvastatin (ZOCOR) 20 MG tablet TAKE 1 TABLET BY MOUTH EVERY DAY 90 tablet 1   No current facility-administered medications for this visit.    REVIEW OF SYSTEMS:   .10 Point review of Systems was done is negative except as noted above.   PHYSICAL EXAMINATION: Vitals:   10/08/21 1029  BP: (!) 156/79  Pulse: (!) 53  Resp: 18  Temp: 98.5 F (36.9 C)  SpO2: 100%   Wt Readings from Last 3 Encounters:  10/08/21 139 lb 1.6 oz (63.1 kg)  08/07/21 140 lb (63.5 kg)  07/22/21 136 lb (61.7 kg)   Body mass index is 22.11 kg/m.    ECOG FS:2 - Symptomatic, <50% confined to bed   . GENERAL:alert, in no acute distress and comfortable SKIN: no acute rashes, no significant lesions EYES: conjunctiva are pink and non-injected, sclera anicteric OROPHARYNX: MMM, no exudates, no oropharyngeal erythema or ulceration NECK: supple, no JVD LYMPH:  no palpable lymphadenopathy in the cervical, axillary or inguinal regions LUNGS: clear to auscultation b/l with normal respiratory effort HEART: regular rate & rhythm ABDOMEN:  normoactive bowel sounds , non tender, not distended. Extremity: no pedal edema PSYCH: alert & oriented x 3 with fluent speech NEURO: no focal motor/sensory deficits    LABORATORY DATA:  I have reviewed the data as listed  . CBC Latest Ref Rng & Units 10/01/2021 05/29/2021 01/16/2021  WBC 4.0 - 10.5 K/uL 4.2 3.7(L) 4.6  Hemoglobin 12.0 - 15.0 g/dL 10.6(L) 10.4(L) 11.3(L)  Hematocrit 36.0 - 46.0 %  34.3(L) 32.8(L) 35.9(L)  Platelets 150 - 400 K/uL 211 214 226    . CMP Latest Ref Rng & Units 10/01/2021 07/22/2021 05/29/2021  Glucose 70 - 99 mg/dL 88 76 74  BUN 8 - 23 mg/dL 17 29(H) 22  Creatinine 0.44 - 1.00 mg/dL 0.94 1.12(H) 1.06(H)  Sodium 135 - 145 mmol/L 138 139 137  Potassium 3.5 - 5.1 mmol/L 4.2 3.9 3.9  Chloride 98 - 111 mmol/L 107 105 103  CO2 22 - 32 mmol/L 26 29 26   Calcium 8.9 - 10.3 mg/dL 9.7 10.2 10.7(H)  Total Protein 6.5 - 8.1 g/dL 10.3(H) -  10.3(H)  Total Bilirubin 0.3 - 1.2 mg/dL 0.4 - 0.3  Alkaline Phos 38 - 126 U/L 44 - 47  AST 15 - 41 U/L 15 - 15  ALT 0 - 44 U/L 8 - 7   Component     Latest Ref Rng & Units 06/21/2019 07/28/2019 08/01/2019  Total Protein, Urine-UPE24     Not Estab. mg/dL   8.2  Total Protein, Urine-Ur/day     30 - 150 mg/24 hr   180 (H)  ALBUMIN, U     %   19.7  ALPHA 1 URINE     %   1.5  Alpha 2, Urine     %   6.0  % BETA, Urine     %   21.4  GAMMA GLOBULIN URINE     %   51.4  Free Kappa Lt Chains,Ur     0.63 - 113.79 mg/L   383.74 (H)  Free Lambda Lt Chains,Ur     0.47 - 11.77 mg/L   0.68  Free Kappa/Lambda Ratio     1.03 - 31.76   564.32 (H)  Immunofixation Result, Urine        Comment  Total Volume        2,200  M-SPIKE %, Urine     Not Observed %   28.3 (H)  M-Spike, mg/24 hr     Not Observed mg/24 hr   51 (H)  NOTE:        Comment  IgG (Immunoglobin G), Serum     586 - 1,602 mg/dL  5,351 (H)   IgA     64 - 422 mg/dL  99   IgM (Immunoglobulin M), Srm     26 - 217 mg/dL  15 (L)   Total Protein ELP     6.0 - 8.5 g/dL  10.2 (H)   Albumin SerPl Elph-Mcnc     2.9 - 4.4 g/dL  4.1   Alpha 1     0.0 - 0.4 g/dL  0.3   Alpha2 Glob SerPl Elph-Mcnc     0.4 - 1.0 g/dL  0.8   B-Globulin SerPl Elph-Mcnc     0.7 - 1.3 g/dL  1.1   Gamma Glob SerPl Elph-Mcnc     0.4 - 1.8 g/dL  3.9 (H)   M Protein SerPl Elph-Mcnc     Not Observed g/dL  3.4 (H)   Globulin, Total     2.2 - 3.9 g/dL  6.1 (H)   Albumin/Glob SerPl     0.7 -  1.7  0.7   IFE 1       Comment   Please Note (HCV):       Comment   Creatinine, Urine     20 - 275 mg/dL 139    Protein/Creat Ratio     21 - 161 mg/g creat 209 (H)    Protein/Creatinine Ratio     0.021 - 0.16 mg/mg creat 0.209 (H)    Total Protein, Urine     5 - 24 mg/dL 29 (H)    Albumin     % 44    Alpha-1-Globulin, U     % 3    ALPHA-2-GLOBULIN, U     % 12    Beta Globulin, U     % 9    Gamma Globulin, U     % 33    Abnormal Protein Band     NONE DETEC mg/dL 7 (H)    Abnormal Protein  Band     NONE DETEC mg/dL 1 (H)    Interpretation          Iron     41 - 142 ug/dL  39 (L)   TIBC     236 - 444 ug/dL  248   Saturation Ratios     21 - 57 %  16 (L)   UIBC     120 - 384 ug/dL  208   Kappa free light chain     3.3 - 19.4 mg/L  412.0 (H)   Lamda free light chains     5.7 - 26.3 mg/L  8.0   Kappa, lamda light chain ratio     0.26 - 1.65  51.50 (H)   Sed Rate     0 - 22 mm/hr  110 (H)   LDH     98 - 192 U/L  135   Ferritin     11 - 307 ng/mL  154   Vitamin B12     180 - 914 pg/mL  709     09/02/2019 Bone Marrow Biopsy ([id number])   08/05/2019 Bone Marrow Biopsy ([id number])        RADIOGRAPHIC STUDIES: I have personally reviewed the radiological images as listed and agreed with the findings in the report. No results found.   ASSESSMENT & PLAN:  Jasmine Fletcher is a 87 y.o. female with:  #1 IgG Kappa Monoclonal paraproteinemia with M protein of 3.9 -highly concerning for MM  06/14/2019 SPEP revealed total protein at 10.1, alpha 2 at 1.0, gamma globulin at 4.1, Abnormal protein band1 at 3.9 UPEP +ve for Bence Jones protein  08/08/2019 PET scan with results revealing "Two right thyroid nodules are both highly hypermetabolic. A significant minority of hypermetabolic thyroid lesions represent thyroid malignancy. Further workup with thyroid ultrasound is recommended. A small left upper para-aortic lymph node is mildly hypermetabolic with maximum  SUV 4.7, significance uncertain. No significant abnormal accentuated osseous activity or compelling focal findings of active multiple myeloma in the skeleton. Hypodense lesions in the liver are photopenic and likely benign Hyperdense small lesions of the left kidney upper pole are too small to characterize but statistically most likely to be complex cysts. If the patient has hematuria or if otherwise clinically warranted, renal protocol MRI with and without contrast could be utilized for further characterization. Mild mid to distal accentuated thoracic esophageal activity with maximum SUV of 4.8; mildly accentuated activity in the gastric cardia/gastroesophageal junction with maximum SUV 5.8. Both could be physiologic but rarely low-grade malignancy could have a similar appearance, upper endoscopy could be utilized for further workup if clinically warranted. Other imaging findings of potential clinical significance: Chronic ischemic microvascular white matter disease intracranially. Aortic Atherosclerosis (ICD10-I70.0). Mild cardiomegaly. Biapical pleuroparenchymal scarring and probable scarring in the superior segment left lower lobe. Calcified uterine fibroids. Thoracolumbar scoliosis. No clear bone lesions."  08/05/2019 bone marrow biopsy ([id number]) with results revealing "no monoclonal b cell population or abnormal T cell phenotype identified. Atypical lymphoplasmacytoid aggregates - not clear for myeloma  09/02/2019 bone marrow biopsy ([id number]) with results revealing small popoulation of monoclonal plasma cell. Does not appear to co-related with M protein levels and seem to be underrepresented.  #2. FDG avid thyroid nodules  #3.  Patient Active Problem List   Diagnosis Date Noted   Depression, major, single episode, complete remission ([place]) 02/13/2020   Smoldering myeloma 10/17/2019   Vitamin D deficiency 10/17/2019   Dementia without behavioral disturbance ([place]) 07/01/2017  Mixed  hyperlipidemia 11/29/2013   Osteoporosis 03/15/2013   BRADYCARDIA 02/17/2009   HYPERTENSION, BENIGN ESSENTIAL 02/16/2009   ANEMIA, HYPOCHROMIC 07/26/2007   DISORDER, TOBACCO USE 07/26/2007   DEPRESSION 07/26/2007   CARDIAC CATHETERIZATION, LEFT, HX OF 07/26/2007    PLAN: -Discussed pt labwork, 10/01/2021; mild stable anemia with Hgb of 10.6, no hypercalcemia, no renal failure. -Advised pt that HCTZ can cause the elevation in calcium. -Will continue with watchful observation due to no major changes in labs or pt's symptoms. -Recommended pt stay socially active to combat fatigue and improve pt's way of life and her wishes.  -Recommended pt stay physically active, sleep well and stay disciplined with this to help with fatigue, eat well, and drink 48-64 oz water daily. - -Will see back in 4 months with labs 1-2 weeks prior.   FOLLOW UP:  Labs in 15 weeks Whole body bone survey in 15 weeks RTC with Dr Irene Limbo in [place] f/u with Dr Hall/Dermatology for continued mx of skin cancer.      The total time spent in the appointment was 20 minutes and more than 50% was on counseling and direct patient cares.  All of the patient's questions were answered with apparent satisfaction. The patient knows to call the clinic with any problems, questions or concerns.   Sullivan Lone MD [place] AAHIVMS [facility] Hematology/Oncology Physician [facility]  (Office):       [phone] (Work cell):  [phone] (Fax):           [phone]  10/14/2021 11:19 PM  I, Reinaldo Raddle, am acting as scribe for Dr. Sullivan Lone, MD.    .I have reviewed the above documentation for accuracy and completeness, and I agree with the above. Brunetta Genera MD

## 2021-10-17 ENCOUNTER — Other Ambulatory Visit: Payer: Self-pay | Admitting: Hematology

## 2021-10-17 DIAGNOSIS — D472 Monoclonal gammopathy: Secondary | ICD-10-CM

## 2021-10-18 DIAGNOSIS — D045 Carcinoma in situ of skin of trunk: Secondary | ICD-10-CM | POA: Diagnosis not present

## 2021-10-18 DIAGNOSIS — L0100 Impetigo, unspecified: Secondary | ICD-10-CM | POA: Diagnosis not present

## 2021-10-30 ENCOUNTER — Other Ambulatory Visit: Payer: Self-pay | Admitting: Hematology

## 2021-11-11 ENCOUNTER — Ambulatory Visit (INDEPENDENT_AMBULATORY_CARE_PROVIDER_SITE_OTHER): Payer: Medicare Other | Admitting: Nurse Practitioner

## 2021-11-11 ENCOUNTER — Encounter: Payer: Self-pay | Admitting: Nurse Practitioner

## 2021-11-11 ENCOUNTER — Other Ambulatory Visit: Payer: Self-pay

## 2021-11-11 ENCOUNTER — Other Ambulatory Visit: Payer: Self-pay | Admitting: Nurse Practitioner

## 2021-11-11 VITALS — BP 132/84 | HR 67 | Temp 97.3°F | Ht 65.5 in | Wt 136.0 lb

## 2021-11-11 DIAGNOSIS — M810 Age-related osteoporosis without current pathological fracture: Secondary | ICD-10-CM

## 2021-11-11 DIAGNOSIS — I10 Essential (primary) hypertension: Secondary | ICD-10-CM

## 2021-11-11 DIAGNOSIS — R634 Abnormal weight loss: Secondary | ICD-10-CM

## 2021-11-11 DIAGNOSIS — E559 Vitamin D deficiency, unspecified: Secondary | ICD-10-CM | POA: Diagnosis not present

## 2021-11-11 DIAGNOSIS — D472 Monoclonal gammopathy: Secondary | ICD-10-CM | POA: Diagnosis not present

## 2021-11-11 DIAGNOSIS — E782 Mixed hyperlipidemia: Secondary | ICD-10-CM

## 2021-11-11 NOTE — Patient Instructions (Signed)
Shingles, TDAP and covid booster due- recommended to get at local pharmacy.

## 2021-11-11 NOTE — Progress Notes (Signed)
Careteam: Patient Care Team: Lauree Chandler, NP as PCP - General (Geriatric Medicine)  PLACE OF SERVICE:  [place] Directive information Does Patient Have a Medical Advance Directive?: Yes, Type of Advance Directive: Out of facility DNR (pink MOST or yellow form), Pre-existing out of facility DNR order (yellow form or pink MOST form): Yellow form placed in chart (order not valid for inpatient use);Pink MOST form placed in chart (order not valid for inpatient use), Does patient want to make changes to medical advance directive?: No - Patient declined  No Known Allergies  Chief Complaint  Patient presents with   Medical Management of Chronic Issues    3 month follow-up. Discuss need for recommended vaccine or post pone if patient refuses. Here with daughter, [name]      HPI: Patient is a 87 y.o. female for routine follow up.  Found out her sister was sick over the weekend.  Htn- blood pressure well controlled on lisinopril 10 mg daily   Continues to follow up with dermatology due to lesion on breast.  Reports it is healing well.  Time follow up.   Hyperlipidemia- well controlled on zocor.   Memory loss- stable on aricept.   Osteoporosis- continues prolia and vit d   Weight loss- tries to eat but at times she just does not want it. Worse now that her sister is sick. Reports both sisters were in the hospital at the same time.  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   Review of Systems:  Review of Systems  Constitutional:  Negative for chills, fever and weight loss.  HENT:  Negative for tinnitus.   Respiratory:  Negative for cough, sputum production and shortness of breath.   Cardiovascular:  Negative for chest pain, palpitations and leg swelling.  Gastrointestinal:  Negative for abdominal pain, constipation, diarrhea and heartburn.  Genitourinary:  Negative for dysuria, frequency and urgency.  Musculoskeletal:   Negative for back pain, falls, joint pain and myalgias.  Skin: Negative.   Neurological:  Negative for dizziness and headaches.  Psychiatric/Behavioral:  Negative for depression and memory loss. The patient does not have insomnia.    Past Medical History:  Diagnosis Date   History of skin cancer    Dr.John Hall   Hyperlipidemia    Hypertension    Osteoporosis    Past Surgical History:  Procedure Laterality Date   ABDOMINAL HYSTERECTOMY  1998   Social History:   reports that she quit smoking about 18 years ago. Her smoking use included cigarettes. She smoked an average of 1 pack per day. She has never used smokeless tobacco. She reports that she does not drink alcohol and does not use drugs.  Family History  Problem Relation Age of Onset   Cancer Father    Dementia Sister    Breast cancer Maternal Grandmother    Breast cancer Paternal Grandmother     Medications: Patient's Medications  New Prescriptions   No medications on file  Previous Medications   ASPIRIN 81 MG TABLET    Take 81 mg by mouth daily.   CHOLECALCIFEROL (VITAMIN D3) 25 MCG (1000 UT) CAPS    Take 1 capsule (1,000 Units total) by mouth daily.   DENOSUMAB (PROLIA) 60 MG/ML SOSY INJECTION    Inject 60 mg into the skin every 6 (six) months.   DONEPEZIL (ARICEPT) 5 MG TABLET    TAKE 1 TABLET(5 MG) BY MOUTH DAILY  FEEDING SUPPLEMENT (BOOST HIGH PROTEIN) LIQD    Take 1 Container by mouth daily.   LISINOPRIL (ZESTRIL) 10 MG TABLET    TAKE 1 TABLET(10 MG) BY MOUTH DAILY   SIMVASTATIN (ZOCOR) 20 MG TABLET    TAKE 1 TABLET BY MOUTH EVERY DAY   VITAMIN D, ERGOCALCIFEROL, (DRISDOL) 1.25 MG (50000 UNIT) CAPS CAPSULE    TAKE 1 CAPSULE BY MOUTH 1 TIME A WEEK  Modified Medications   No medications on file  Discontinued Medications   No medications on file    Physical Exam:  Vitals:   11/11/21 0902  BP: 132/84  Pulse: 67  Temp: (!) 97.3 F (36.3 C)  TempSrc: Temporal  SpO2: 98%  Weight: 136 lb (61.7 kg)  Height:  5' 5.5" (1.664 m)   Body mass index is 22.29 kg/m. Wt Readings from Last 3 Encounters:  11/11/21 136 lb (61.7 kg)  10/08/21 139 lb 1.6 oz (63.1 kg)  08/07/21 140 lb (63.5 kg)    Physical Exam Constitutional:      General: She is not in acute distress.    Appearance: She is well-developed. She is not diaphoretic.  HENT:     Head: Normocephalic and atraumatic.     Mouth/Throat:     Pharynx: No oropharyngeal exudate.  Eyes:     Conjunctiva/sclera: Conjunctivae normal.     Pupils: Pupils are equal, round, and reactive to light.  Cardiovascular:     Rate and Rhythm: Normal rate and regular rhythm.     Heart sounds: Normal heart sounds.  Pulmonary:     Effort: Pulmonary effort is normal.     Breath sounds: Normal breath sounds.  Abdominal:     General: Bowel sounds are normal.     Palpations: Abdomen is soft.  Musculoskeletal:     Cervical back: Normal range of motion and neck supple.     Right lower leg: No edema.     Left lower leg: No edema.  Skin:    General: Skin is warm and dry.  Neurological:     Mental Status: She is alert.  Psychiatric:        Mood and Affect: Mood normal.    Labs reviewed: Basic Metabolic Panel: Recent Labs    05/29/21 1108 07/22/21 0949 10/01/21 0918  NA 137 139 138  K 3.9 3.9 4.2  CL 103 105 107  CO2 26 29 26   GLUCOSE 74 76 88  BUN 22 29* 17  CREATININE 1.06* 1.12* 0.94  CALCIUM 10.7* 10.2 9.7   Liver Function Tests: Recent Labs    01/16/21 0914 05/29/21 1108 10/01/21 0918  AST 17 15 15   ALT 10 7 8   ALKPHOS 59 47 44  BILITOT 0.4 0.3 0.4  PROT 11.5* 10.3* 10.3*  ALBUMIN 3.5 3.2* 3.3*   No results for input(s): LIPASE, AMYLASE in the last 8760 hours. No results for input(s): AMMONIA in the last 8760 hours. CBC: Recent Labs    01/16/21 0914 05/29/21 1108 10/01/21 0918  WBC 4.6 3.7* 4.2  NEUTROABS 2.5 2.0 2.0  HGB 11.3* 10.4* 10.6*  HCT 35.9* 32.8* 34.3*  MCV 86.3 85.2 86.6  PLT 226 214 211   Lipid Panel: Recent  Labs    07/22/21 0949  CHOL 139  HDL 43*  LDLCALC 79  TRIG 91  CHOLHDL 3.2   TSH: No results for input(s): TSH in the last 8760 hours. A1C: No results found for: HGBA1C   Assessment/Plan 1. HYPERTENSION, BENIGN ESSENTIAL -Blood pressure well controlled  Continue current medications Recheck metabolic panel  2. Osteoporosis, unspecified osteoporosis type, unspecified pathological fracture presence -continues on prolia with vit d supplement.   3. Vitamin D deficiency -continues on supplement.   4. Smoldering myeloma Followed by oncology, stable at this time.   5. Mixed hyperlipidemia -continues on zocor 20 mg daily with dietary modifications.   6. Weight loss Decrease appetite due to sisters being sick, reports some days are better than others. Encouraged to try to get 3 meals a day in, continue with nutritional supplements daily.    Next appt: 3 months.  Carlos American. [place], [place] [facility] [phone]

## 2021-12-18 ENCOUNTER — Ambulatory Visit
Admission: RE | Admit: 2021-12-18 | Discharge: 2021-12-18 | Disposition: A | Discharge status: Home / Self Care | Payer: Medicaid Other | Source: Ambulatory Visit | Attending: Nurse Practitioner | Admitting: Nurse Practitioner

## 2021-12-18 DIAGNOSIS — M810 Age-related osteoporosis without current pathological fracture: Secondary | ICD-10-CM | POA: Diagnosis not present

## 2021-12-18 DIAGNOSIS — M85852 Other specified disorders of bone density and structure, left thigh: Secondary | ICD-10-CM | POA: Diagnosis not present

## 2021-12-18 DIAGNOSIS — E2839 Other primary ovarian failure: Secondary | ICD-10-CM

## 2021-12-18 DIAGNOSIS — Z780 Asymptomatic menopausal state: Secondary | ICD-10-CM | POA: Diagnosis not present

## 2021-12-19 ENCOUNTER — Other Ambulatory Visit: Payer: Medicare Other

## 2022-01-17 ENCOUNTER — Ambulatory Visit (HOSPITAL_COMMUNITY)
Admission: RE | Admit: 2022-01-17 | Discharge: 2022-01-17 | Disposition: A | Discharge status: Home / Self Care | Payer: Medicare Other | Source: Ambulatory Visit | Attending: Hematology | Admitting: Hematology

## 2022-01-17 ENCOUNTER — Other Ambulatory Visit: Payer: Self-pay

## 2022-01-17 DIAGNOSIS — D472 Monoclonal gammopathy: Secondary | ICD-10-CM | POA: Diagnosis not present

## 2022-01-17 DIAGNOSIS — C9000 Multiple myeloma not having achieved remission: Secondary | ICD-10-CM | POA: Diagnosis not present

## 2022-01-21 ENCOUNTER — Inpatient Hospital Stay: Payer: Medicare Other | Attending: Hematology

## 2022-01-21 ENCOUNTER — Other Ambulatory Visit: Payer: Self-pay

## 2022-01-21 ENCOUNTER — Other Ambulatory Visit: Payer: Self-pay | Admitting: *Deleted

## 2022-01-21 DIAGNOSIS — D472 Monoclonal gammopathy: Secondary | ICD-10-CM | POA: Diagnosis not present

## 2022-01-21 DIAGNOSIS — M810 Age-related osteoporosis without current pathological fracture: Secondary | ICD-10-CM | POA: Insufficient documentation

## 2022-01-21 DIAGNOSIS — E079 Disorder of thyroid, unspecified: Secondary | ICD-10-CM | POA: Insufficient documentation

## 2022-01-21 LAB — CBC WITH DIFFERENTIAL/PLATELET
Abs Immature Granulocytes: 0.01 10*3/uL (ref 0.00–0.07)
Basophils Absolute: 0 10*3/uL (ref 0.0–0.1)
Basophils Relative: 1 %
Eosinophils Absolute: 0 10*3/uL (ref 0.0–0.5)
Eosinophils Relative: 0 %
HCT: 33.2 % — ABNORMAL LOW (ref 36.0–46.0)
Hemoglobin: 10.3 g/dL — ABNORMAL LOW (ref 12.0–15.0)
Immature Granulocytes: 0 %
Lymphocytes Relative: 33 %
Lymphs Abs: 1.2 10*3/uL (ref 0.7–4.0)
MCH: 27.2 pg (ref 26.0–34.0)
MCHC: 31 g/dL (ref 30.0–36.0)
MCV: 87.8 fL (ref 80.0–100.0)
Monocytes Absolute: 0.9 10*3/uL (ref 0.1–1.0)
Monocytes Relative: 24 %
Neutro Abs: 1.5 10*3/uL — ABNORMAL LOW (ref 1.7–7.7)
Neutrophils Relative %: 42 %
Platelets: 171 10*3/uL (ref 150–400)
RBC: 3.78 MIL/uL — ABNORMAL LOW (ref 3.87–5.11)
RDW: 14.4 % (ref 11.5–15.5)
WBC: 3.6 10*3/uL — ABNORMAL LOW (ref 4.0–10.5)
nRBC: 0 % (ref 0.0–0.2)

## 2022-01-21 LAB — CMP (CANCER CENTER ONLY)
ALT: 10 U/L (ref 0–44)
AST: 18 U/L (ref 15–41)
Albumin: 3.6 g/dL (ref 3.5–5.0)
Alkaline Phosphatase: 44 U/L (ref 38–126)
Anion gap: 4 — ABNORMAL LOW (ref 5–15)
BUN: 23 mg/dL (ref 8–23)
CO2: 30 mmol/L (ref 22–32)
Calcium: 9.8 mg/dL (ref 8.9–10.3)
Chloride: 104 mmol/L (ref 98–111)
Creatinine: 1.19 mg/dL — ABNORMAL HIGH (ref 0.44–1.00)
GFR, Estimated: 44 mL/min — ABNORMAL LOW (ref >60)
Glucose, Bld: 75 mg/dL (ref 70–99)
Potassium: 4.1 mmol/L (ref 3.5–5.1)
Sodium: 138 mmol/L (ref 135–145)
Total Bilirubin: 0.2 mg/dL — ABNORMAL LOW (ref 0.3–1.2)
Total Protein: 9.8 g/dL — ABNORMAL HIGH (ref 6.5–8.1)

## 2022-01-21 MED ORDER — DENOSUMAB 60 MG/ML ~~LOC~~ SOSY: 60.0000 mg | PREFILLED_SYRINGE | SUBCUTANEOUS | 0 refills | Status: DC

## 2022-01-21 NOTE — Telephone Encounter (Signed)
Patient daughter, [name] called and stated that they pick up patient's Prolia injection from pharmacy and needs Rx called to pharmacy because patient has an appointment scheduled for 2/16. Faxed Rx.

## 2022-01-23 ENCOUNTER — Other Ambulatory Visit: Payer: Self-pay

## 2022-01-23 ENCOUNTER — Ambulatory Visit (INDEPENDENT_AMBULATORY_CARE_PROVIDER_SITE_OTHER): Payer: Medicare Other

## 2022-01-23 DIAGNOSIS — M810 Age-related osteoporosis without current pathological fracture: Secondary | ICD-10-CM

## 2022-01-23 MED ORDER — DENOSUMAB 60 MG/ML ~~LOC~~ SOSY
60.0000 mg | PREFILLED_SYRINGE | Freq: Once | SUBCUTANEOUS | Status: AC
  Administered 2022-01-23: 60 mg via SUBCUTANEOUS

## 2022-01-24 LAB — MULTIPLE MYELOMA PANEL, SERUM
Albumin SerPl Elph-Mcnc: 3.8 g/dL (ref 2.9–4.4)
Albumin/Glob SerPl: 0.7 (ref 0.7–1.7)
Alpha 1: 0.3 g/dL (ref 0.0–0.4)
Alpha2 Glob SerPl Elph-Mcnc: 0.7 g/dL (ref 0.4–1.0)
B-Globulin SerPl Elph-Mcnc: 0.8 g/dL (ref 0.7–1.3)
Gamma Glob SerPl Elph-Mcnc: 3.7 g/dL — ABNORMAL HIGH (ref 0.4–1.8)
Globulin, Total: 5.5 g/dL — ABNORMAL HIGH (ref 2.2–3.9)
IgA: 63 mg/dL — ABNORMAL LOW (ref 64–422)
IgG (Immunoglobin G), Serum: 4852 mg/dL — ABNORMAL HIGH (ref 586–1602)
IgM (Immunoglobulin M), Srm: 12 mg/dL — ABNORMAL LOW (ref 26–217)
M Protein SerPl Elph-Mcnc: 3.5 g/dL — ABNORMAL HIGH
Total Protein ELP: 9.3 g/dL — ABNORMAL HIGH (ref 6.0–8.5)

## 2022-01-28 ENCOUNTER — Other Ambulatory Visit: Payer: Self-pay

## 2022-01-28 ENCOUNTER — Inpatient Hospital Stay (HOSPITAL_BASED_OUTPATIENT_CLINIC_OR_DEPARTMENT_OTHER): Payer: Medicare Other | Admitting: Hematology

## 2022-01-28 VITALS — BP 149/66 | HR 79 | Temp 97.0°F | Resp 18 | Wt 128.9 lb

## 2022-01-28 DIAGNOSIS — M810 Age-related osteoporosis without current pathological fracture: Secondary | ICD-10-CM | POA: Diagnosis not present

## 2022-01-28 DIAGNOSIS — D472 Monoclonal gammopathy: Secondary | ICD-10-CM

## 2022-01-28 DIAGNOSIS — E079 Disorder of thyroid, unspecified: Secondary | ICD-10-CM | POA: Diagnosis not present

## 2022-01-28 NOTE — Progress Notes (Signed)
HEMATOLOGY/ONCOLOGY NOTE  Date of Service: 01/28/2022   Patient Care Team: Lauree Chandler, NP as PCP - General (Geriatric Medicine)   CHIEF COMPLAINTS:  F/u for smoldering myeloma   HISTORY OF PRESENTING ILLNESS:   Jasmine Fletcher is a wonderful 88 y.o. female who has been referred to [place] by Lauree Chandler, NP for evaluation and management of elevated serum globulin levels. The pt reports that she is doing well overall.  The pt reports that her appetite has decreased recently but that she eats a healthy diet. She notes mild fatigue. Denies new bone pain, back pain, hip pain, infection concerns, and any other new concerns.  She has been getting a Prolia shot every 6 months for the past 2 years. She has also been taking Aricept but notes that her memory is worsening. Her daughter is her health care POA, but the pt makes her own healthcare decisions at this time.  Most recent lab results (06/14/2019) of CBC is as follows: all values are WNL except for HGB at 11.4, MCH at 26.8. 06/21/2019 UPEP revealed protein/creat ratio at 209, total protein at 29, and two abnormal protein bands 06/14/2019 SPEP revealed total protein at 10.1, Alpha 2 at 1.0, gamma globulin at 4.1, abnormal protein band1 at 3.9  On review of systems, pt reports some fatigue, decreased appetite, and denies new bone pain, back pain, hip pain, infection concerns, and any other symptoms.   On PMHx the pt reports that she is taking BP medication  On Social Hx the pt reports that she quit smoking in 2004 and does not drink alcohol   INTERVAL HISTORY:  Jasmine Fletcher is a 88 y.o. female here for evaluation and management of her plasma cell dyscrasia.  The patient's last visit with [place] was on 10/08/2021.   She reports she is doing okay overall. She does note she gets tired easily, which she adds is stable from prior. She reports she has not been eating as well and feels she is losing weight. On chart review, she  has lost about 8 lbs over the last 4 months. Her daughter notes the pt's sister passed in December, which has caused some depression and difficulty eating. Daughter also notes she stays cold, in part due to not eating enough. She endorses drinking one Boost a day as a supplement. She reports she is afraid to cook as her right wrist is weak.  She reports she receives a steroid injection in her hip every 6 months. She denies any new bone pain, leg swelling, fevers, chills.  Of note since pt's last visit, pt has had DG Bone Survey Met ([id number]) on 01/17/2022: "No lytic lesions identified in the visualized bones."  Lab results 01/21/2022 of CBC w/diff shows stable anemia with a hemoglobin of 10.3 previously 10.6. WBC count of 3.6k and normal platelets of 171k. CMP showed slightly elevated kidney function with creatinine of 1.19, normal calcium of 9.8.  Total protein of 9.8 which is slightly improved.  MEDICAL HISTORY:  Past Medical History:  Diagnosis Date   History of skin cancer    Dr.John Hall   Hyperlipidemia    Hypertension    Osteoporosis     SURGICAL HISTORY: Past Surgical History:  Procedure Laterality Date   ABDOMINAL HYSTERECTOMY  1998    SOCIAL HISTORY: Social History   Socioeconomic History   Marital status: Single    Spouse name: Not on file   Number of children: Not on file  Years of education: Not on file   Highest education level: Not on file  Occupational History   Not on file  Tobacco Use   Smoking status: Former    Packs/day: 1.00    Types: Cigarettes    Quit date: 12/08/2002    Years since quitting: 19.1   Smokeless tobacco: Never   Tobacco comments:    Quit t age 63   Vaping Use   Vaping Use: Never used  Substance and Sexual Activity   Alcohol use: No    Alcohol/week: 0.0 standard drinks   Drug use: No   Sexual activity: Not Currently  Other Topics Concern   Not on file  Social History Narrative   ** Merged History Encounter **       Social  Determinants of Health   Financial Resource Strain: Not on file  Food Insecurity: Not on file  Transportation Needs: Not on file  Physical Activity: Not on file  Stress: Not on file  Social Connections: Not on file  Intimate Partner Violence: Not on file    FAMILY HISTORY: Family History  Problem Relation Age of Onset   Cancer Father    Dementia Sister    Breast cancer Maternal Grandmother    Breast cancer Paternal Grandmother     ALLERGIES:  has No Known Allergies.  MEDICATIONS:  Current Outpatient Medications  Medication Sig Dispense Refill   aspirin 81 MG tablet Take 81 mg by mouth daily.     Cholecalciferol (VITAMIN D3) 25 MCG (1000 UT) CAPS Take 1 capsule (1,000 Units total) by mouth daily. 90 capsule 1   denosumab (PROLIA) 60 MG/ML SOSY injection Inject 60 mg into the skin every 6 (six) months. 60 mL 0   donepezil (ARICEPT) 5 MG tablet TAKE 1 TABLET(5 MG) BY MOUTH DAILY 90 tablet 1   feeding supplement (BOOST HIGH PROTEIN) LIQD Take 1 Container by mouth daily.     lisinopril (ZESTRIL) 10 MG tablet TAKE 1 TABLET(10 MG) BY MOUTH DAILY 90 tablet 1   simvastatin (ZOCOR) 20 MG tablet TAKE 1 TABLET BY MOUTH EVERY DAY 90 tablet 1   Vitamin D, Ergocalciferol, (DRISDOL) 1.25 MG (50000 UNIT) CAPS capsule TAKE 1 CAPSULE BY MOUTH 1 TIME A WEEK 12 capsule 2   No current facility-administered medications for this visit.    REVIEW OF SYSTEMS:   .10 Point review of Systems was done is negative except as noted above.   PHYSICAL EXAMINATION: Vitals:   01/28/22 0910  BP: (!) 149/66  Pulse: 79  Resp: 18  Temp: (!) 97 F (36.1 C)  SpO2: 97%    Wt Readings from Last 3 Encounters:  11/11/21 136 lb (61.7 kg)  10/08/21 139 lb 1.6 oz (63.1 kg)  08/07/21 140 lb (63.5 kg)   There is no height or weight on file to calculate BMI.    ECOG FS:2 - Symptomatic, <50% confined to bed   . GENERAL:alert, in no acute distress and comfortable SKIN: no acute rashes, no significant  lesions EYES: conjunctiva are pink and non-injected, sclera anicteric OROPHARYNX: MMM, no exudates, no oropharyngeal erythema or ulceration NECK: supple, no JVD LYMPH:  no palpable lymphadenopathy in the cervical, axillary or inguinal regions LUNGS: clear to auscultation b/l with normal respiratory effort HEART: regular rate & rhythm ABDOMEN:  normoactive bowel sounds , non tender, not distended. Extremity: no pedal edema PSYCH: alert & oriented x 3 with fluent speech NEURO: no focal motor/sensory deficits    LABORATORY DATA:  I have  reviewed the data as listed  . CBC Latest Ref Rng & Units 01/21/2022 10/01/2021 05/29/2021  WBC 4.0 - 10.5 K/uL 3.6(L) 4.2 3.7(L)  Hemoglobin 12.0 - 15.0 g/dL 10.3(L) 10.6(L) 10.4(L)  Hematocrit 36.0 - 46.0 % 33.2(L) 34.3(L) 32.8(L)  Platelets 150 - 400 K/uL 171 211 214    . CMP Latest Ref Rng & Units 01/21/2022 10/01/2021 07/22/2021  Glucose 70 - 99 mg/dL 75 88 76  BUN 8 - 23 mg/dL 23 17 29(H)  Creatinine 0.44 - 1.00 mg/dL 1.19(H) 0.94 1.12(H)  Sodium 135 - 145 mmol/L 138 138 139  Potassium 3.5 - 5.1 mmol/L 4.1 4.2 3.9  Chloride 98 - 111 mmol/L 104 107 105  CO2 22 - 32 mmol/L 30 26 29   Calcium 8.9 - 10.3 mg/dL 9.8 9.7 10.2  Total Protein 6.5 - 8.1 g/dL 9.8(H) 10.3(H) -  Total Bilirubin 0.3 - 1.2 mg/dL 0.2(L) 0.4 -  Alkaline Phos 38 - 126 U/L 44 44 -  AST 15 - 41 U/L 18 15 -  ALT 0 - 44 U/L 10 8 -   Component     Latest Ref Rng & Units 06/21/2019 07/28/2019 08/01/2019  Total Protein, Urine-UPE24     Not Estab. mg/dL   8.2  Total Protein, Urine-Ur/day     30 - 150 mg/24 hr   180 (H)  ALBUMIN, U     %   19.7  ALPHA 1 URINE     %   1.5  Alpha 2, Urine     %   6.0  % BETA, Urine     %   21.4  GAMMA GLOBULIN URINE     %   51.4  Free Kappa Lt Chains,Ur     0.63 - 113.79 mg/L   383.74 (H)  Free Lambda Lt Chains,Ur     0.47 - 11.77 mg/L   0.68  Free Kappa/Lambda Ratio     1.03 - 31.76   564.32 (H)  Immunofixation Result, Urine         Comment  Total Volume        2,200  M-SPIKE %, Urine     Not Observed %   28.3 (H)  M-Spike, mg/24 hr     Not Observed mg/24 hr   51 (H)  NOTE:        Comment  IgG (Immunoglobin G), Serum     586 - 1,602 mg/dL  5,351 (H)   IgA     64 - 422 mg/dL  99   IgM (Immunoglobulin M), Srm     26 - 217 mg/dL  15 (L)   Total Protein ELP     6.0 - 8.5 g/dL  10.2 (H)   Albumin SerPl Elph-Mcnc     2.9 - 4.4 g/dL  4.1   Alpha 1     0.0 - 0.4 g/dL  0.3   Alpha2 Glob SerPl Elph-Mcnc     0.4 - 1.0 g/dL  0.8   B-Globulin SerPl Elph-Mcnc     0.7 - 1.3 g/dL  1.1   Gamma Glob SerPl Elph-Mcnc     0.4 - 1.8 g/dL  3.9 (H)   M Protein SerPl Elph-Mcnc     Not Observed g/dL  3.4 (H)   Globulin, Total     2.2 - 3.9 g/dL  6.1 (H)   Albumin/Glob SerPl     0.7 - 1.7  0.7   IFE 1       Comment   Please  Note (HCV):       Comment   Creatinine, Urine     20 - 275 mg/dL 139    Protein/Creat Ratio     21 - 161 mg/g creat 209 (H)    Protein/Creatinine Ratio     0.021 - 0.16 mg/mg creat 0.209 (H)    Total Protein, Urine     5 - 24 mg/dL 29 (H)    Albumin     % 44    Alpha-1-Globulin, U     % 3    ALPHA-2-GLOBULIN, U     % 12    Beta Globulin, U     % 9    Gamma Globulin, U     % 33    Abnormal Protein Band     NONE DETEC mg/dL 7 (H)    Abnormal Protein Band     NONE DETEC mg/dL 1 (H)    Interpretation          Iron     41 - 142 ug/dL  39 (L)   TIBC     236 - 444 ug/dL  248   Saturation Ratios     21 - 57 %  16 (L)   UIBC     120 - 384 ug/dL  208   Kappa free light chain     3.3 - 19.4 mg/L  412.0 (H)   Lamda free light chains     5.7 - 26.3 mg/L  8.0   Kappa, lamda light chain ratio     0.26 - 1.65  51.50 (H)   Sed Rate     0 - 22 mm/hr  110 (H)   LDH     98 - 192 U/L  135   Ferritin     11 - 307 ng/mL  154   Vitamin B12     180 - 914 pg/mL  709     09/02/2019 Bone Marrow Biopsy ([id number])   08/05/2019 Bone Marrow Biopsy ([id number])        RADIOGRAPHIC  STUDIES: I have personally reviewed the radiological images as listed and agreed with the findings in the report. DG Bone Survey Met  Result Date: 01/19/2022 CLINICAL DATA:  Myeloma staging EXAM: METASTATIC BONE SURVEY COMPARISON:  None. FINDINGS: Degenerative changes in the cervical spine. S shaped scoliotic curvature of the thoracic and lumbar spine. Degenerative changes in the thoracic and lumbar spine. No lytic lesions identified in the visualized bones. IMPRESSION: No lytic lesions identified in the visualized bones. Electronically Signed   By: Dorise Bullion III M.D.   On: 01/19/2022 11:38     ASSESSMENT & PLAN:  Jasmine Fletcher is a 88 y.o. female with:  #1 IgG Kappa Monoclonal paraproteinemia with M protein of 3.9 -highly concerning for MM  06/14/2019 SPEP revealed total protein at 10.1, alpha 2 at 1.0, gamma globulin at 4.1, Abnormal protein band1 at 3.9 UPEP +ve for Bence Jones protein  08/08/2019 PET scan with results revealing "Two right thyroid nodules are both highly hypermetabolic. A significant minority of hypermetabolic thyroid lesions represent thyroid malignancy. Further workup with thyroid ultrasound is recommended. A small left upper para-aortic lymph node is mildly hypermetabolic with maximum SUV 4.7, significance uncertain. No significant abnormal accentuated osseous activity or compelling focal findings of active multiple myeloma in the skeleton. Hypodense lesions in the liver are photopenic and likely benign Hyperdense small lesions of the left kidney upper pole are too small to characterize but statistically most likely to  be complex cysts. If the patient has hematuria or if otherwise clinically warranted, renal protocol MRI with and without contrast could be utilized for further characterization. Mild mid to distal accentuated thoracic esophageal activity with maximum SUV of 4.8; mildly accentuated activity in the gastric cardia/gastroesophageal junction with maximum SUV  5.8. Both could be physiologic but rarely low-grade malignancy could have a similar appearance, upper endoscopy could be utilized for further workup if clinically warranted. Other imaging findings of potential clinical significance: Chronic ischemic microvascular white matter disease intracranially. Aortic Atherosclerosis (ICD10-I70.0). Mild cardiomegaly. Biapical pleuroparenchymal scarring and probable scarring in the superior segment left lower lobe. Calcified uterine fibroids. Thoracolumbar scoliosis. No clear bone lesions."  08/05/2019 bone marrow biopsy ([id number]) with results revealing "no monoclonal b cell population or abnormal T cell phenotype identified. Atypical lymphoplasmacytoid aggregates - not clear for myeloma  09/02/2019 bone marrow biopsy ([id number]) with results revealing small popoulation of monoclonal plasma cell. Does not appear to co-related with M protein levels and seem to be underrepresented.  #2. FDG avid thyroid nodules  #3.  Patient Active Problem List   Diagnosis Date Noted   Depression, major, single episode, complete remission ([place]) 02/13/2020   Smoldering myeloma 10/17/2019   Vitamin D deficiency 10/17/2019   Dementia without behavioral disturbance ([place]) 07/01/2017   Mixed hyperlipidemia 11/29/2013   Osteoporosis 03/15/2013   BRADYCARDIA 02/17/2009   HYPERTENSION, BENIGN ESSENTIAL 02/16/2009   ANEMIA, HYPOCHROMIC 07/26/2007   DISORDER, TOBACCO USE 07/26/2007   DEPRESSION 07/26/2007   CARDIAC CATHETERIZATION, LEFT, HX OF 07/26/2007    PLAN: -Discussed pt lab work, 01/17/2022; mild stable anemia with hgb of 10.3, WBC 3.6k, creatinine 1.19. -Myeloma panel shows stable M spike of 3.5 g/dL of IgG G kappa monoclonal protein which is unchanged. -Discussed pt DG Bone Survey Met ([id number]) on 01/17/2022: No lytic lesions identified in the visualized bones. -No lab or clinical evidence of overt progression to active multiple myeloma. -Will continue with  watchful observation due to no major changes in labs or pt's symptoms. -We discussed referral for grief support today. She will let us know if she wants to proceed. -Recommended pt try to eat more. -Will see back in 4 months with labs 1-2 weeks prior.   FOLLOW UP: -Labs in 15 weeks -RTC with Dr Irene Limbo in 16weeks  All of the patient's questions were answered with apparent satisfaction. The patient knows to call the clinic with any problems, questions or concerns.   Sullivan Lone MD MS AAHIVMS [facility] Hematology/Oncology Physician [place]   01/28/2022 9:02 AM  I, Wilburn Mylar, am acting as scribe for Sullivan Lone, MD.   .I have reviewed the above documentation for accuracy and completeness, and I agree with the above. Brunetta Genera MD

## 2022-01-30 ENCOUNTER — Telehealth: Payer: Self-pay | Admitting: Hematology

## 2022-01-30 NOTE — Telephone Encounter (Addendum)
Scheduled follow-up appointments per 2/21 los. Patient's daughter is aware.

## 2022-02-07 ENCOUNTER — Other Ambulatory Visit: Payer: Self-pay | Admitting: Nurse Practitioner

## 2022-02-07 DIAGNOSIS — I10 Essential (primary) hypertension: Secondary | ICD-10-CM

## 2022-02-10 ENCOUNTER — Encounter: Payer: Self-pay | Admitting: Nurse Practitioner

## 2022-02-10 ENCOUNTER — Ambulatory Visit (INDEPENDENT_AMBULATORY_CARE_PROVIDER_SITE_OTHER): Payer: Medicare Other | Admitting: Nurse Practitioner

## 2022-02-10 ENCOUNTER — Other Ambulatory Visit: Payer: Self-pay

## 2022-02-10 VITALS — BP 132/80 | HR 56 | Temp 97.9°F | Ht 65.5 in | Wt 128.0 lb

## 2022-02-10 DIAGNOSIS — E559 Vitamin D deficiency, unspecified: Secondary | ICD-10-CM | POA: Diagnosis not present

## 2022-02-10 DIAGNOSIS — I10 Essential (primary) hypertension: Secondary | ICD-10-CM

## 2022-02-10 DIAGNOSIS — C9000 Multiple myeloma not having achieved remission: Secondary | ICD-10-CM | POA: Insufficient documentation

## 2022-02-10 DIAGNOSIS — G4700 Insomnia, unspecified: Secondary | ICD-10-CM | POA: Diagnosis not present

## 2022-02-10 DIAGNOSIS — M810 Age-related osteoporosis without current pathological fracture: Secondary | ICD-10-CM | POA: Diagnosis not present

## 2022-02-10 DIAGNOSIS — R634 Abnormal weight loss: Secondary | ICD-10-CM | POA: Diagnosis not present

## 2022-02-10 DIAGNOSIS — F0390 Unspecified dementia without behavioral disturbance: Secondary | ICD-10-CM

## 2022-02-10 DIAGNOSIS — F325 Major depressive disorder, single episode, in full remission: Secondary | ICD-10-CM | POA: Diagnosis not present

## 2022-02-10 DIAGNOSIS — F4321 Adjustment disorder with depressed mood: Secondary | ICD-10-CM

## 2022-02-10 MED ORDER — MIRTAZAPINE 7.5 MG PO TABS
7.5000 mg | ORAL_TABLET | Freq: Every day | ORAL | 1 refills | Status: DC
Start: 2022-02-10 — End: 2022-03-24

## 2022-02-10 MED ORDER — LISINOPRIL 10 MG PO TABS: ORAL_TABLET | ORAL | 3 refills | Status: DC

## 2022-02-10 NOTE — Progress Notes (Signed)
? ? ?Careteam: ?Patient Care Team: ?Lauree Chandler, NP as PCP - General (Geriatric Medicine) ? ?PLACE OF SERVICE:  ?[facility]  ?Advanced Directive information ?  ? ?Not on File ? ?Chief Complaint  ?Patient presents with  ? Medical Management of Chronic Issues  ?  3 month follow-up. Discuss need for shingrix, td/tdap, and covid boosters or port pone if patient refuses. Patient denies receiving any vaccines since last visit. Here with daughter [name] who thinks patient would benefit from a counseling services due to loss of sister.  ?  ? ? ? ?HPI: Patient is a 88 y.o. female for routine follow up.  ? ?Her sister passed away in 10-23-2023, reports she is seeing her at night, comes and sits at the foot of her bed and tells her shes okay.  ?She was with her a lot.  ? ?Decrease appetite.  ?Low mood- does not feel like she is depressed at this time.  ?Trouble sleeping as well.  ? ?Review of Systems:  ?Review of Systems  ?Constitutional:  Negative for chills, fever and weight loss.  ?HENT:  Negative for tinnitus.   ?Respiratory:  Negative for cough, sputum production and shortness of breath.   ?Cardiovascular:  Negative for chest pain, palpitations and leg swelling.  ?Gastrointestinal:  Negative for abdominal pain, constipation, diarrhea and heartburn.  ?Genitourinary:  Negative for dysuria, frequency and urgency.  ?Musculoskeletal:  Negative for back pain, falls, joint pain and myalgias.  ?Skin: Negative.   ?Neurological:  Negative for dizziness and headaches.  ?Psychiatric/Behavioral:  Negative for depression and memory loss. The patient has insomnia.   ? ?Past Medical History:  ?Diagnosis Date  ? History of skin cancer   ? Dr.John [place] Crane  ? Hyperlipidemia   ? Hypertension   ? Osteoporosis   ? ?Past Surgical History:  ?Procedure Laterality Date  ? ABDOMINAL HYSTERECTOMY  1998  ? ?Social History: ?  reports that she quit smoking about 19 years ago. Her smoking use included cigarettes. She smoked an average of 1 pack per  day. She has never used smokeless tobacco. She reports that she does not drink alcohol and does not use drugs. ? ?Family History  ?Problem Relation Age of Onset  ? Cancer Father   ? Dementia Sister   ? Breast cancer Maternal Grandmother   ? Breast cancer Paternal Grandmother   ? ? ?Medications: ?Patient's Medications  ?New Prescriptions  ? No medications on file  ?Previous Medications  ? ASPIRIN 81 MG TABLET    Take 81 mg by mouth daily.  ? CHOLECALCIFEROL (VITAMIN D3) 25 MCG (1000 UT) CAPS    Take 1 capsule (1,000 Units total) by mouth daily.  ? DENOSUMAB (PROLIA) 60 MG/ML SOSY INJECTION    Inject 60 mg into the skin every 6 (six) months.  ? DONEPEZIL (ARICEPT) 5 MG TABLET    TAKE 1 TABLET(5 MG) BY MOUTH DAILY  ? FEEDING SUPPLEMENT (BOOST HIGH PROTEIN) LIQD    Take 1 Container by mouth daily.  ? LISINOPRIL (ZESTRIL) 10 MG TABLET    TAKE 1 TABLET(10 MG) BY MOUTH DAILY  ? SIMVASTATIN (ZOCOR) 20 MG TABLET    TAKE 1 TABLET BY MOUTH EVERY DAY  ? VITAMIN D, ERGOCALCIFEROL, (DRISDOL) 1.25 MG (50000 UNIT) CAPS CAPSULE    TAKE 1 CAPSULE BY MOUTH 1 TIME A WEEK  ?Modified Medications  ? No medications on file  ?Discontinued Medications  ? No medications on file  ? ? ?Physical Exam: ? ?Vitals:  ? 02/10/22  0935  ?BP: 132/80  ?Pulse: (!) 56  ?Temp: 97.9 ?F (36.6 ?C)  ?SpO2: 99%  ?Weight: 128 lb (58.1 kg)  ?Height: 5' 5.5" (1.664 m)  ? ?Body mass index is 20.98 kg/m?. ?Wt Readings from Last 3 Encounters:  ?02/10/22 128 lb (58.1 kg)  ?01/28/22 128 lb 14.4 oz (58.5 kg)  ?11/11/21 136 lb (61.7 kg)  ? ? ?Physical Exam ?Constitutional:   ?   General: She is not in acute distress. ?   Appearance: She is well-developed. She is not diaphoretic.  ?HENT:  ?   Head: Normocephalic and atraumatic.  ?   Mouth/Throat:  ?   Pharynx: No oropharyngeal exudate.  ?Eyes:  ?   Conjunctiva/sclera: Conjunctivae normal.  ?   Pupils: Pupils are equal, round, and reactive to light.  ?Cardiovascular:  ?   Rate and Rhythm: Normal rate and regular rhythm.  ?    Heart sounds: Normal heart sounds.  ?Pulmonary:  ?   Effort: Pulmonary effort is normal.  ?   Breath sounds: Normal breath sounds.  ?Abdominal:  ?   General: Bowel sounds are normal.  ?   Palpations: Abdomen is soft.  ?Musculoskeletal:  ?   Cervical back: Normal range of motion and neck supple.  ?   Right lower leg: No edema.  ?   Left lower leg: No edema.  ?Skin: ?   General: Skin is warm and dry.  ?Neurological:  ?   Mental Status: She is alert. Mental status is at baseline.  ?Psychiatric:     ?   Mood and Affect: Mood normal.  ? ?Labs reviewed: ?Basic Metabolic Panel: ?Recent Labs  ?  07/22/21 ?9509 10/01/21 ?3267 01/21/22 ?0844  ?NA 139 138 138  ?K 3.9 4.2 4.1  ?CL 105 107 104  ?CO2 _0 ?GLUCOSE 76 88 75  ?BUN 29* 17 23  ?CREATININE 1.12* 0.94 1.19*  ?CALCIUM 10.2 9.7 9.8  ? ?Liver Function Tests: ?Recent Labs  ?  05/29/21 ?1108 10/01/21 ?1245 01/21/22 ?0844  ?AST _1 ?ALT _2 ?ALKPHOS 47 44 44  ?BILITOT 0.3 0.4 0.2*  ?PROT 10.3* 10.3* 9.8*  ?ALBUMIN 3.2* 3.3* 3.6  ? ?No results for input(s): LIPASE, AMYLASE in the last 8760 hours. ?No results for input(s): AMMONIA in the last 8760 hours. ?CBC: ?Recent Labs  ?  05/29/21 ?1108 10/01/21 ?8099 01/21/22 ?0844  ?WBC 3.7* 4.2 3.6*  ?NEUTROABS 2.0 2.0 1.5*  ?HGB 10.4* 10.6* 10.3*  ?HCT 32.8* 34.3* 33.2*  ?MCV 85.2 86.6 87.8  ?PLT 214 211 171  ? ?Lipid Panel: ?Recent Labs  ?  07/22/21 ?0949  ?CHOL 139  ?HDL 43*  ?[place] 79  ?TRIG 91  ?CHOLHDL 3.2  ? ?TSH: ?No results for input(s): TSH in the last 8760 hours. ?A1C: ?No results found for: HGBA1C ? ? ?Assessment/Plan ?1. HYPERTENSION, BENIGN ESSENTIAL ?-Blood pressure well controlled ?Continue current medications ?Recheck metabolic panel ?- lisinopril (ZESTRIL) 10 MG tablet; TAKE 1 TABLET(10 MG) BY MOUTH DAILY  Dispense: 90 tablet; Refill: 3 ? ?2. Multiple myeloma not having achieved remission ([place]) ?-continues to be followed by oncologist, stable.  ? ?3. Depression, major, single episode, complete  remission ([place]) ?-denies depression at this time but going through grief. ?- mirtazapine (REMERON) 7.5 MG tablet; Take 1 tablet (7.5 mg total) by mouth at bedtime.  Dispense: 30 tablet; Refill: 1 ? ?4. Osteoporosis, unspecified osteoporosis type, unspecified pathological fracture presence ?-continues on prolia, Recommended to take calcium 600  mg twice daily with Vitamin D 2000 units daily and weight bearing activity 30 mins/5 days a week. ? ?5. Dementia without behavioral disturbance ([place]) ?Stable, without any progression of memory loss noted.  ? ?6. Vitamin D deficiency ?-continue on supplement.  ? ?7. Weight loss ?-decreased appetite, will add remeron to help with mood, appetite and sleep.  ?- mirtazapine (REMERON) 7.5 MG tablet; Take 1 tablet (7.5 mg total) by mouth at bedtime.  Dispense: 30 tablet; Refill: 1 ? ?8. Insomnia, unspecified type ?Ongoing, having a hard time sleeping. ?- mirtazapine (REMERON) 7.5 MG tablet; Take 1 tablet (7.5 mg total) by mouth at bedtime.  Dispense: 30 tablet; Refill: 1 ? ?9. Grief ?-recommend calling grief counselor, can get in touch through  hospice services.  ? ? ?Return in about 6 weeks (around 03/24/2022) for mood, sleep, weight. ?Carlos American. Dewaine Oats, AGNP ? ?[facility] ?[phone]  ?

## 2022-02-10 NOTE — Patient Instructions (Addendum)
Look up grief counseling through hospice  ? ?Due for shingles vaccine and TDAP- can get at local pharmacy  ?

## 2022-03-24 ENCOUNTER — Ambulatory Visit (INDEPENDENT_AMBULATORY_CARE_PROVIDER_SITE_OTHER): Payer: Medicare Other | Admitting: Nurse Practitioner

## 2022-03-24 ENCOUNTER — Encounter: Payer: Self-pay | Admitting: Nurse Practitioner

## 2022-03-24 VITALS — BP 150/90 | HR 67 | Temp 97.1°F | Ht 65.5 in | Wt 128.0 lb

## 2022-03-24 DIAGNOSIS — R634 Abnormal weight loss: Secondary | ICD-10-CM | POA: Diagnosis not present

## 2022-03-24 DIAGNOSIS — G4700 Insomnia, unspecified: Secondary | ICD-10-CM

## 2022-03-24 NOTE — Patient Instructions (Addendum)
Can try over the counter melatonin 1 mg by mouth daily (1-6 mg)  ? ?Continue to work on eating 3 meals a day with proper snacking between meals.  ?

## 2022-03-24 NOTE — Progress Notes (Signed)
? ? ?Careteam: ?Patient Care Team: ?Lauree Chandler, NP as PCP - General (Geriatric Medicine) ? ?PLACE OF SERVICE:  ?[facility]  ?Advanced Directive information ?Does Patient Have a Medical Advance Directive?: Yes, Type of Advance Directive: Out of facility DNR (pink MOST or yellow form), Pre-existing out of facility DNR order (yellow form or pink MOST form): Yellow form placed in chart (order not valid for inpatient use);Pink MOST form placed in chart (order not valid for inpatient use), Does patient want to make changes to medical advance directive?: No - Patient declined ? ?No Known Allergies ? ?Chief Complaint  ?Patient presents with  ? Follow-up  ?  6 week follow-up. Discuss need for shingrix, td/tdap and additional covid boosters or post pone if patient refuses. NCIR verified. Patient denies receiving any vaccines since last visit. Patient stopped Remeron. Here with daughter, [name].   ? ? ? ?HPI: Patient is a 88 y.o. female for follow up on remeron. Reports she could not sleep ?Felt like medication did not help sleep at all and some night she would just get up. She stopped Remeron does not feel like it was good for her. ?Drinking warm milk prior to bed and that helps.  ?Previously went to a center and now does not do that with COVID>  ? ?Trying to eat better. Daughter watches her eating habits.  ? ?Review of Systems:  ?Review of Systems  ?Constitutional:  Negative for chills, fever and weight loss.  ?Respiratory:  Negative for cough, sputum production and shortness of breath.   ?Cardiovascular:  Negative for chest pain, palpitations and leg swelling.  ?Gastrointestinal:  Negative for abdominal pain, constipation, diarrhea and heartburn.  ?Genitourinary:  Negative for dysuria, frequency and urgency.  ?Musculoskeletal:  Positive for myalgias. Negative for back pain, falls and joint pain.  ?Skin: Negative.   ?Neurological:  Negative for dizziness and headaches.  ?Psychiatric/Behavioral:  Negative for  depression and memory loss. The patient has insomnia.   ? ?Past Medical History:  ?Diagnosis Date  ? History of skin cancer   ? Dr.John Nevada Crane  ? Hyperlipidemia   ? Hypertension   ? Osteoporosis   ? ?Past Surgical History:  ?Procedure Laterality Date  ? ABDOMINAL HYSTERECTOMY  1998  ? ?Social History: ?  reports that she quit smoking about 19 years ago. Her smoking use included cigarettes. She smoked an average of 1 pack per day. She has never used smokeless tobacco. She reports that she does not drink alcohol and does not use drugs. ? ?Family History  ?Problem Relation Age of Onset  ? Cancer Father   ? Dementia Sister   ? Breast cancer Maternal Grandmother   ? Breast cancer Paternal Grandmother   ? ? ?Medications: ?Patient's Medications  ?New Prescriptions  ? No medications on file  ?Previous Medications  ? ASPIRIN 81 MG TABLET    Take 81 mg by mouth daily.  ? CHOLECALCIFEROL (VITAMIN D3) 25 MCG (1000 UT) CAPS    Take 1 capsule (1,000 Units total) by mouth daily.  ? DENOSUMAB (PROLIA) 60 MG/ML SOSY INJECTION    Inject 60 mg into the skin every 6 (six) months.  ? DONEPEZIL (ARICEPT) 5 MG TABLET    TAKE 1 TABLET(5 MG) BY MOUTH DAILY  ? FEEDING SUPPLEMENT (BOOST HIGH PROTEIN) LIQD    Take 1 Container by mouth daily.  ? LISINOPRIL (ZESTRIL) 10 MG TABLET    TAKE 1 TABLET(10 MG) BY MOUTH DAILY  ? SIMVASTATIN (ZOCOR) 20 MG TABLET  TAKE 1 TABLET BY MOUTH EVERY DAY  ? VITAMIN D, ERGOCALCIFEROL, (DRISDOL) 1.25 MG (50000 UNIT) CAPS CAPSULE    TAKE 1 CAPSULE BY MOUTH 1 TIME A WEEK  ?Modified Medications  ? No medications on file  ?Discontinued Medications  ? MIRTAZAPINE (REMERON) 7.5 MG TABLET    Take 1 tablet (7.5 mg total) by mouth at bedtime.  ? ? ?Physical Exam: ? ?Vitals:  ? 03/24/22 0844  ?BP: (!) 150/90  ?Pulse: 67  ?Temp: (!) 97.1 ?F (36.2 ?C)  ?TempSrc: Temporal  ?SpO2: 98%  ?Weight: 128 lb (58.1 kg)  ?Height: 5' 5.5" (1.664 m)  ? ?Body mass index is 20.98 kg/m?. ?Wt Readings from Last 3 Encounters:  ?03/24/22 128 lb  (58.1 kg)  ?02/10/22 128 lb (58.1 kg)  ?01/28/22 128 lb 14.4 oz (58.5 kg)  ? ? ?Physical Exam ?Constitutional:   ?   General: She is not in acute distress. ?   Appearance: She is well-developed. She is not diaphoretic.  ?HENT:  ?   Head: Normocephalic and atraumatic.  ?   Mouth/Throat:  ?   Pharynx: No oropharyngeal exudate.  ?Eyes:  ?   Conjunctiva/sclera: Conjunctivae normal.  ?   Pupils: Pupils are equal, round, and reactive to light.  ?Cardiovascular:  ?   Rate and Rhythm: Normal rate and regular rhythm.  ?   Heart sounds: Normal heart sounds.  ?Pulmonary:  ?   Effort: Pulmonary effort is normal.  ?   Breath sounds: Normal breath sounds.  ?Abdominal:  ?   General: Bowel sounds are normal.  ?   Palpations: Abdomen is soft.  ?Musculoskeletal:  ?   Cervical back: Normal range of motion and neck supple.  ?   Right lower leg: No edema.  ?   Left lower leg: No edema.  ?Skin: ?   General: Skin is warm and dry.  ?Neurological:  ?   Mental Status: She is alert.  ?Psychiatric:     ?   Mood and Affect: Mood normal.  ? ? ?Labs reviewed: ?Basic Metabolic Panel: ?Recent Labs  ?  07/22/21 ?8502 10/01/21 ?7741 01/21/22 ?0844  ?NA 139 138 138  ?K 3.9 4.2 4.1  ?CL 105 107 104  ?CO2 '29 26 30  '$ ?GLUCOSE 76 88 75  ?BUN 29* 17 23  ?CREATININE 1.12* 0.94 1.19*  ?CALCIUM 10.2 9.7 9.8  ? ?Liver Function Tests: ?Recent Labs  ?  05/29/21 ?1108 10/01/21 ?2878 01/21/22 ?0844  ?AST '15 15 18  '$ ?ALT '7 8 10  '$ ?ALKPHOS 47 44 44  ?BILITOT 0.3 0.4 0.2*  ?PROT 10.3* 10.3* 9.8*  ?ALBUMIN 3.2* 3.3* 3.6  ? ?No results for input(s): LIPASE, AMYLASE in the last 8760 hours. ?No results for input(s): AMMONIA in the last 8760 hours. ?CBC: ?Recent Labs  ?  05/29/21 ?1108 10/01/21 ?6767 01/21/22 ?0844  ?WBC 3.7* 4.2 3.6*  ?NEUTROABS 2.0 2.0 1.5*  ?HGB 10.4* 10.6* 10.3*  ?HCT 32.8* 34.3* 33.2*  ?MCV 85.2 86.6 87.8  ?PLT 214 211 171  ? ?Lipid Panel: ?Recent Labs  ?  07/22/21 ?0949  ?CHOL 139  ?HDL 43*  ?[place] 79  ?TRIG 91  ?CHOLHDL 3.2  ? ?TSH: ?No results for  input(s): TSH in the last 8760 hours. ?A1C: ?No results found for: HGBA1C ? ? ?Assessment/Plan ?1. Insomnia, unspecified type ?-ongoing, remeron did not help, would not like to take additional medication at this time and continue lifestyle modifications.  ? ?2. Weight loss ?Stable at this time, no ongoing weight  loss, encouraged to liberalize diet. To have protein supplement in addition to smallest meal of the day. ? ? ? ?Return in about 3 months (around 06/23/2022) for routine follow up . ?Carlos American. Dewaine Oats, AGNP ? ?[facility] ?[phone]  ?

## 2022-04-08 ENCOUNTER — Other Ambulatory Visit: Payer: Self-pay | Admitting: Nurse Practitioner

## 2022-04-08 DIAGNOSIS — F325 Major depressive disorder, single episode, in full remission: Secondary | ICD-10-CM

## 2022-04-08 DIAGNOSIS — R634 Abnormal weight loss: Secondary | ICD-10-CM

## 2022-04-08 DIAGNOSIS — G4700 Insomnia, unspecified: Secondary | ICD-10-CM

## 2022-05-08 ENCOUNTER — Other Ambulatory Visit: Payer: Self-pay | Admitting: Nurse Practitioner

## 2022-05-08 DIAGNOSIS — E782 Mixed hyperlipidemia: Secondary | ICD-10-CM

## 2022-05-13 ENCOUNTER — Inpatient Hospital Stay: Payer: Medicare Other | Attending: Hematology

## 2022-05-13 ENCOUNTER — Other Ambulatory Visit: Payer: Self-pay

## 2022-05-13 DIAGNOSIS — E041 Nontoxic single thyroid nodule: Secondary | ICD-10-CM | POA: Insufficient documentation

## 2022-05-13 DIAGNOSIS — D472 Monoclonal gammopathy: Secondary | ICD-10-CM | POA: Insufficient documentation

## 2022-05-13 DIAGNOSIS — D649 Anemia, unspecified: Secondary | ICD-10-CM | POA: Diagnosis not present

## 2022-05-13 LAB — CMP (CANCER CENTER ONLY)
ALT: 7 U/L (ref 0–44)
AST: 14 U/L — ABNORMAL LOW (ref 15–41)
Albumin: 3.5 g/dL (ref 3.5–5.0)
Alkaline Phosphatase: 36 U/L — ABNORMAL LOW (ref 38–126)
Anion gap: 4 — ABNORMAL LOW (ref 5–15)
BUN: 19 mg/dL (ref 8–23)
CO2: 28 mmol/L (ref 22–32)
Calcium: 10 mg/dL (ref 8.9–10.3)
Chloride: 105 mmol/L (ref 98–111)
Creatinine: 0.96 mg/dL (ref 0.44–1.00)
GFR, Estimated: 57 mL/min — ABNORMAL LOW (ref >60)
Glucose, Bld: 83 mg/dL (ref 70–99)
Potassium: 4 mmol/L (ref 3.5–5.1)
Sodium: 137 mmol/L (ref 135–145)
Total Bilirubin: 0.2 mg/dL — ABNORMAL LOW (ref 0.3–1.2)
Total Protein: 9.8 g/dL — ABNORMAL HIGH (ref 6.5–8.1)

## 2022-05-13 LAB — CBC WITH DIFFERENTIAL/PLATELET
Abs Immature Granulocytes: 0.01 10*3/uL (ref 0.00–0.07)
Basophils Absolute: 0 10*3/uL (ref 0.0–0.1)
Basophils Relative: 1 %
Eosinophils Absolute: 0 10*3/uL (ref 0.0–0.5)
Eosinophils Relative: 1 %
HCT: 32.4 % — ABNORMAL LOW (ref 36.0–46.0)
Hemoglobin: 10.2 g/dL — ABNORMAL LOW (ref 12.0–15.0)
Immature Granulocytes: 0 %
Lymphocytes Relative: 28 %
Lymphs Abs: 1 10*3/uL (ref 0.7–4.0)
MCH: 27.6 pg (ref 26.0–34.0)
MCHC: 31.5 g/dL (ref 30.0–36.0)
MCV: 87.8 fL (ref 80.0–100.0)
Monocytes Absolute: 0.5 10*3/uL (ref 0.1–1.0)
Monocytes Relative: 14 %
Neutro Abs: 1.9 10*3/uL (ref 1.7–7.7)
Neutrophils Relative %: 56 %
Platelets: 187 10*3/uL (ref 150–400)
RBC: 3.69 MIL/uL — ABNORMAL LOW (ref 3.87–5.11)
RDW: 14.1 % (ref 11.5–15.5)
WBC: 3.4 10*3/uL — ABNORMAL LOW (ref 4.0–10.5)
nRBC: 0 % (ref 0.0–0.2)

## 2022-05-19 LAB — MULTIPLE MYELOMA PANEL, SERUM
Albumin SerPl Elph-Mcnc: 3.8 g/dL (ref 2.9–4.4)
Albumin/Glob SerPl: 0.7 (ref 0.7–1.7)
Alpha 1: 0.3 g/dL (ref 0.0–0.4)
Alpha2 Glob SerPl Elph-Mcnc: 0.7 g/dL (ref 0.4–1.0)
B-Globulin SerPl Elph-Mcnc: 0.8 g/dL (ref 0.7–1.3)
Gamma Glob SerPl Elph-Mcnc: 3.8 g/dL — ABNORMAL HIGH (ref 0.4–1.8)
Globulin, Total: 5.6 g/dL — ABNORMAL HIGH (ref 2.2–3.9)
IgA: 66 mg/dL (ref 64–422)
IgG (Immunoglobin G), Serum: 4961 mg/dL — ABNORMAL HIGH (ref 586–1602)
IgM (Immunoglobulin M), Srm: 13 mg/dL — ABNORMAL LOW (ref 26–217)
M Protein SerPl Elph-Mcnc: 3.5 g/dL — ABNORMAL HIGH
Total Protein ELP: 9.4 g/dL — ABNORMAL HIGH (ref 6.0–8.5)

## 2022-05-20 ENCOUNTER — Inpatient Hospital Stay (HOSPITAL_BASED_OUTPATIENT_CLINIC_OR_DEPARTMENT_OTHER): Payer: Medicare Other | Admitting: Hematology

## 2022-05-20 ENCOUNTER — Other Ambulatory Visit: Payer: Self-pay

## 2022-05-20 VITALS — BP 167/70 | HR 63 | Temp 97.2°F | Resp 18 | Wt 127.1 lb

## 2022-05-20 DIAGNOSIS — D472 Monoclonal gammopathy: Secondary | ICD-10-CM

## 2022-05-20 DIAGNOSIS — E041 Nontoxic single thyroid nodule: Secondary | ICD-10-CM | POA: Diagnosis not present

## 2022-05-20 DIAGNOSIS — D649 Anemia, unspecified: Secondary | ICD-10-CM | POA: Diagnosis not present

## 2022-05-21 ENCOUNTER — Telehealth: Payer: Self-pay | Admitting: Hematology

## 2022-05-21 NOTE — Telephone Encounter (Signed)
Scheduled follow-up appointments per 6/13 los. Patient's daughter is aware. Mailed calendar.

## 2022-05-26 NOTE — Progress Notes (Signed)
HEMATOLOGY/ONCOLOGY NOTE  Date of Service:05/20/2022    Patient Care Team: [name], NP as PCP - General (Geriatric Medicine)   CHIEF COMPLAINTS:  Follow-up for continued evaluation and management of smoldering myeloma   HISTORY OF PRESENTING ILLNESS:   Jasmine Fletcher is a wonderful 88 y.o. female who has been referred to [place] by [name], NP for evaluation and management of elevated serum globulin levels. The pt reports that she is doing well overall.  The pt reports that her appetite has decreased recently but that she eats a healthy diet. She notes mild fatigue. Denies new bone pain, back pain, hip pain, infection concerns, and any other new concerns.  She has been getting a Prolia shot every 6 months for the past 2 years. She has also been taking Aricept but notes that her memory is worsening. Her daughter is her health care POA, but the pt makes her own healthcare decisions at this time.  Most recent lab results (06/14/2019) of CBC is as follows: all values are WNL except for HGB at 11.4, MCH at 26.8. 06/21/2019 UPEP revealed protein/creat ratio at 209, total protein at 29, and two abnormal protein bands 06/14/2019 SPEP revealed total protein at 10.1, Alpha 2 at 1.0, gamma globulin at 4.1, abnormal protein band1 at 3.9    INTERVAL HISTORY:  Jasmine Fletcher is a 88 y.o. female is here for continued valuation and management of smoldering myeloma and for her 58-month follow-up.  Patient notes no acute new symptoms since her last clinic visit.  Notes on mild grade 1 fatigue.  No new focal bone pains. No significant weight loss.  Slight decrease in appetite which the patient is making help with additional supplements like boost. No fevers no chills no night sweats.  Labs done on 05/13/2022 were reviewed in detail with the patient and her daughter.   MEDICAL HISTORY:  Past Medical History:  Diagnosis Date   History of skin cancer    Dr.John Hall    Hyperlipidemia    Hypertension    Osteoporosis     SURGICAL HISTORY: Past Surgical History:  Procedure Laterality Date   ABDOMINAL HYSTERECTOMY  1998    SOCIAL HISTORY: Social History   Socioeconomic History   Marital status: Single    Spouse name: Not on file   Number of children: Not on file   Years of education: Not on file   Highest education level: Not on file  Occupational History   Not on file  Tobacco Use   Smoking status: Former    Packs/day: 1.00    Types: Cigarettes    Quit date: 12/08/2002    Years since quitting: 19.4   Smokeless tobacco: Never   Tobacco comments:    Quit t age 51   Vaping Use   Vaping Use: Never used  Substance and Sexual Activity   Alcohol use: No    Alcohol/week: 0.0 standard drinks of alcohol   Drug use: No   Sexual activity: Not Currently  Other Topics Concern   Not on file  Social History Narrative   ** Merged History Encounter **       Social Determinants of Health   Financial Resource Strain: Low Risk  (06/04/2018)   Overall Financial Resource Strain (CARDIA)    Difficulty of Paying Living Expenses: Not hard at all  Food Insecurity: No Food Insecurity (06/04/2018)   Hunger Vital Sign    Worried About Running Out of Food in the Last  Year: Never true    [place] in the Last Year: Never true  Transportation Needs: No Transportation Needs (06/04/2018)   PRAPARE - Hydrologist (Medical): No    Lack of Transportation (Non-Medical): No  Physical Activity: Insufficiently Active (06/04/2018)   Exercise Vital Sign    Days of Exercise per Week: 3 days    Minutes of Exercise per Session: 30 min  Stress: No Stress Concern Present (06/04/2018)   [place]    Feeling of Stress : Not at all  Social Connections: Moderately Isolated (06/04/2018)   Social Connection and Isolation Panel [NHANES]    Frequency of Communication with Friends and  Family: Three times a week    Frequency of Social Gatherings with Friends and Family: Three times a week    Attends Religious Services: Never    Active Member of Clubs or Organizations: No    Attends Archivist Meetings: Never    Marital Status: Never married  Intimate Partner Violence: Not At Risk (06/04/2018)   Humiliation, Afraid, Rape, and Kick questionnaire    Fear of Current or Ex-Partner: No    Emotionally Abused: No    Physically Abused: No    Sexually Abused: No    FAMILY HISTORY: Family History  Problem Relation Age of Onset   Cancer Father    Dementia Sister    Breast cancer Maternal Grandmother    Breast cancer Paternal Grandmother     ALLERGIES:  has No Known Allergies.  MEDICATIONS:  Current Outpatient Medications  Medication Sig Dispense Refill   aspirin 81 MG tablet Take 81 mg by mouth daily.     Cholecalciferol (VITAMIN D3) 25 MCG (1000 UT) CAPS Take 1 capsule (1,000 Units total) by mouth daily. 90 capsule 1   denosumab (PROLIA) 60 MG/ML SOSY injection Inject 60 mg into the skin every 6 (six) months. 60 mL 0   donepezil (ARICEPT) 5 MG tablet TAKE 1 TABLET(5 MG) BY MOUTH DAILY 90 tablet 1   feeding supplement (BOOST HIGH PROTEIN) LIQD Take 1 Container by mouth daily.     lisinopril (ZESTRIL) 10 MG tablet TAKE 1 TABLET(10 MG) BY MOUTH DAILY 90 tablet 3   simvastatin (ZOCOR) 20 MG tablet TAKE 1 TABLET BY MOUTH EVERY DAY 90 tablet 1   Vitamin D, Ergocalciferol, (DRISDOL) 1.25 MG (50000 UNIT) CAPS capsule TAKE 1 CAPSULE BY MOUTH 1 TIME A WEEK 12 capsule 2   No current facility-administered medications for this visit.    REVIEW OF SYSTEMS:   10 Point review of Systems was done is negative except as noted above.  PHYSICAL EXAMINATION: Vitals:   05/20/22 0905  BP: (!) 167/70  Pulse: 63  Resp: 18  Temp: (!) 97.2 F (36.2 C)  SpO2: 97%    Wt Readings from Last 3 Encounters:  05/20/22 127 lb 1.6 oz (57.7 kg)  03/24/22 128 lb (58.1 kg)  02/10/22  128 lb (58.1 kg)   Body mass index is 20.83 kg/m.    NAD GENERAL:alert, in no acute distress and comfortable SKIN: no acute rashes, no significant lesions EYES: conjunctiva are pink and non-injected, sclera anicteric OROPHARYNX: MMM, no exudates, no oropharyngeal erythema or ulceration NECK: supple, no JVD LYMPH:  no palpable lymphadenopathy in the cervical, axillary or inguinal regions LUNGS: clear to auscultation b/l with normal respiratory effort HEART: regular rate & rhythm ABDOMEN:  normoactive bowel sounds , non tender, not distended. Extremity:  no pedal edema PSYCH: alert & oriented x 3 with fluent speech NEURO: no focal motor/sensory deficits    LABORATORY DATA:  I have reviewed the data as listed  .    Latest Ref Rng & Units 05/13/2022    8:57 AM 01/21/2022    8:44 AM 10/01/2021    9:18 AM  CBC  WBC 4.0 - 10.5 K/uL 3.4  3.6  4.2   Hemoglobin 12.0 - 15.0 g/dL 10.2  10.3  10.6   Hematocrit 36.0 - 46.0 % 32.4  33.2  34.3   Platelets 150 - 400 K/uL 187  171  211     .    Latest Ref Rng & Units 05/13/2022    8:57 AM 01/21/2022    8:44 AM 10/01/2021    9:18 AM  CMP  Glucose 70 - 99 mg/dL 83  75  88   BUN 8 - 23 mg/dL _0 Creatinine 0.44 - 1.00 mg/dL 0.96  1.19  0.94   Sodium 135 - 145 mmol/L 137  138  138   Potassium 3.5 - 5.1 mmol/L 4.0  4.1  4.2   Chloride 98 - 111 mmol/L 105  104  107   CO2 22 - 32 mmol/L _1 Calcium 8.9 - 10.3 mg/dL 10.0  9.8  9.7   Total Protein 6.5 - 8.1 g/dL 9.8  9.8  10.3   Total Bilirubin 0.3 - 1.2 mg/dL 0.2  0.2  0.4   Alkaline Phos 38 - 126 U/L 36  44  44   AST 15 - 41 U/L _2 ALT 0 - 44 U/L _3 Component     Latest Ref Rng & Units 06/21/2019 07/28/2019 08/01/2019  Total Protein, Urine-UPE24     Not Estab. mg/dL   8.2  Total Protein, Urine-Ur/day     30 - 150 mg/24 hr   180 (H)  ALBUMIN, U     %   19.7  ALPHA 1 URINE     %   1.5  Alpha 2, Urine     %   6.0  % BETA, Urine     %   21.4   GAMMA GLOBULIN URINE     %   51.4  Free Kappa Lt Chains,Ur     0.63 - 113.79 mg/L   383.74 (H)  Free Lambda Lt Chains,Ur     0.47 - 11.77 mg/L   0.68  Free Kappa/Lambda Ratio     1.03 - 31.76   564.32 (H)  Immunofixation Result, Urine        Comment  Total Volume        2,200  M-SPIKE %, Urine     Not Observed %   28.3 (H)  M-Spike, mg/24 hr     Not Observed mg/24 hr   51 (H)  NOTE:        Comment  IgG (Immunoglobin G), Serum     586 - 1,602 mg/dL  5,351 (H)   IgA     64 - 422 mg/dL  99   IgM (Immunoglobulin M), Srm     26 - 217 mg/dL  15 (L)   Total Protein ELP     6.0 - 8.5 g/dL  10.2 (H)   Albumin SerPl Elph-Mcnc     2.9 - 4.4 g/dL  4.1   Alpha 1  0.0 - 0.4 g/dL  0.3   Alpha2 Glob SerPl Elph-Mcnc     0.4 - 1.0 g/dL  0.8   B-Globulin SerPl Elph-Mcnc     0.7 - 1.3 g/dL  1.1   Gamma Glob SerPl Elph-Mcnc     0.4 - 1.8 g/dL  3.9 (H)   M Protein SerPl Elph-Mcnc     Not Observed g/dL  3.4 (H)   Globulin, Total     2.2 - 3.9 g/dL  6.1 (H)   Albumin/Glob SerPl     0.7 - 1.7  0.7   IFE 1       Comment   Please Note (HCV):       Comment   Creatinine, Urine     20 - 275 mg/dL 139    Protein/Creat Ratio     21 - 161 mg/g creat 209 (H)    Protein/Creatinine Ratio     0.021 - 0.16 mg/mg creat 0.209 (H)    Total Protein, Urine     5 - 24 mg/dL 29 (H)    Albumin     % 44    Alpha-1-Globulin, U     % 3    ALPHA-2-GLOBULIN, U     % 12    Beta Globulin, U     % 9    Gamma Globulin, U     % 33    Abnormal Protein Band     NONE DETEC mg/dL 7 (H)    Abnormal Protein Band     NONE DETEC mg/dL 1 (H)    Interpretation          Iron     41 - 142 ug/dL  39 (L)   TIBC     236 - 444 ug/dL  248   Saturation Ratios     21 - 57 %  16 (L)   UIBC     120 - 384 ug/dL  208   Kappa free light chain     3.3 - 19.4 mg/L  412.0 (H)   Lamda free light chains     5.7 - 26.3 mg/L  8.0   Kappa, lamda light chain ratio     0.26 - 1.65  51.50 (H)   Sed Rate     0 - 22  mm/hr  110 (H)   LDH     98 - 192 U/L  135   Ferritin     11 - 307 ng/mL  154   Vitamin B12     180 - 914 pg/mL  709     09/02/2019 Bone Marrow Biopsy ([id number])   08/05/2019 Bone Marrow Biopsy ([id number])        RADIOGRAPHIC STUDIES: I have personally reviewed the radiological images as listed and agreed with the findings in the report. No results found.   ASSESSMENT & PLAN:  Jasmine Fletcher is a 88 y.o. female with:  #1 IgG Kappa Monoclonal paraproteinemia with M protein of 3.9 -highly concerning for MM  06/14/2019 SPEP revealed total protein at 10.1, alpha 2 at 1.0, gamma globulin at 4.1, Abnormal protein band1 at 3.9 UPEP +ve for Bence Jones protein  08/08/2019 PET scan with results revealing "Two right thyroid nodules are both highly hypermetabolic. A significant minority of hypermetabolic thyroid lesions represent thyroid malignancy. Further workup with thyroid ultrasound is recommended. A small left upper para-aortic lymph node is mildly hypermetabolic with maximum SUV 4.7, significance uncertain. No significant abnormal accentuated osseous activity or compelling focal findings  of active multiple myeloma in the skeleton. Hypodense lesions in the liver are photopenic and likely benign Hyperdense small lesions of the left kidney upper pole are too small to characterize but statistically most likely to be complex cysts. If the patient has hematuria or if otherwise clinically warranted, renal protocol MRI with and without contrast could be utilized for further characterization. Mild mid to distal accentuated thoracic esophageal activity with maximum SUV of 4.8; mildly accentuated activity in the gastric cardia/gastroesophageal junction with maximum SUV 5.8. Both could be physiologic but rarely low-grade malignancy could have a similar appearance, upper endoscopy could be utilized for further workup if clinically warranted. Other imaging findings of potential clinical  significance: Chronic ischemic microvascular white matter disease intracranially. Aortic Atherosclerosis (ICD10-I70.0). Mild cardiomegaly. Biapical pleuroparenchymal scarring and probable scarring in the superior segment left lower lobe. Calcified uterine fibroids. Thoracolumbar scoliosis. No clear bone lesions."  08/05/2019 bone marrow biopsy ([id number]) with results revealing "no monoclonal b cell population or abnormal T cell phenotype identified. Atypical lymphoplasmacytoid aggregates - not clear for myeloma  09/02/2019 bone marrow biopsy ([id number]) with results revealing small popoulation of monoclonal plasma cell. Does not appear to co-related with M protein levels and seem to be underrepresented.  #2. FDG avid thyroid nodules  #3.  Patient Active Problem List   Diagnosis Date Noted   Multiple myeloma not having achieved remission ([place]) 02/10/2022   Depression, major, single episode, complete remission ([place]) 02/13/2020   Smoldering myeloma 10/17/2019   Vitamin D deficiency 10/17/2019   Dementia without behavioral disturbance ([place]) 07/01/2017   Mixed hyperlipidemia 11/29/2013   Osteoporosis 03/15/2013   BRADYCARDIA 02/17/2009   HYPERTENSION, BENIGN ESSENTIAL 02/16/2009   ANEMIA, HYPOCHROMIC 07/26/2007   DISORDER, TOBACCO USE 07/26/2007   DEPRESSION 07/26/2007   CARDIAC CATHETERIZATION, LEFT, HX OF 07/26/2007    PLAN: -Discussed lab results from 05/13/2022 in detail. CBC shows mild stable anemia with a hemoglobin of 10.2 WBC count of 3.4k and platelets of 187k CMP stable Myeloma panel shows stable M spike of 3.5 g/dL which is unchanged from her last visit. Patient has no lab or clinical findings suggestive of progression to active myeloma at this time. No indication to initiate myeloma treatment at this time. We will continue to monitor her closely every 4 months with labs.  FOLLOW UP: Phone visit with Dr. Irene Limbo in 4 months Labs 1 week prior to phone visit  The total time  spent in the appointment was 20 minutes*.  All of the patient's questions were answered with apparent satisfaction. The patient knows to call the clinic with any problems, questions or concerns.   Sullivan Lone MD MS AAHIVMS [facility] Hematology/Oncology Physician [facility]  .*Total Encounter Time as defined by the Centers for Medicare and Medicaid Services includes, in addition to the face-to-face time of a patient visit (documented in the note above) non-face-to-face time: obtaining and reviewing outside history, ordering and reviewing medications, tests or procedures, care coordination (communications with other health care professionals or caregivers) and documentation in the medical record.

## 2022-06-18 ENCOUNTER — Encounter: Payer: Self-pay | Admitting: *Deleted

## 2022-06-18 NOTE — Telephone Encounter (Signed)
error 

## 2022-06-24 ENCOUNTER — Ambulatory Visit: Payer: Medicare Other | Admitting: Nurse Practitioner

## 2022-06-26 NOTE — Progress Notes (Signed)
Careteam: Patient Care Team: Lauree Chandler, NP as PCP - General (Geriatric Medicine)  PLACE OF SERVICE:  [facility] Directive information Does Patient Have a Medical Advance Directive?: Yes, Type of Advance Directive: Out of facility DNR (pink MOST or yellow form), Pre-existing out of facility DNR order (yellow form or pink MOST form): Yellow form placed in chart (order not valid for inpatient use);Pink MOST form placed in chart (order not valid for inpatient use), Does patient want to make changes to medical advance directive?: No - Patient declined  No Known Allergies  Chief Complaint  Patient presents with   Medical Management of Chronic Issues    3 month follow-up. Discuss need for shingrix, td/tdap, and additional covid boosters or post pone if patient refuses. NCIR verified. Patient denies receiving any vaccines since last visit. Here with daughter [name]      HPI: Patient is a 88 y.o. female for routine follow up.   Her birthday is today.   Reports her father was very tall and slim.  States she takes after that side of the family Reports she eats and snacks a lot.   Has a new neighbor and getting out with them.   She has no acute concerns.    Review of Systems:  Review of Systems  Constitutional:  Negative for chills, fever and weight loss.  HENT:  Negative for tinnitus.   Respiratory:  Negative for cough, sputum production and shortness of breath.   Cardiovascular:  Negative for chest pain, palpitations and leg swelling.  Gastrointestinal:  Negative for abdominal pain, constipation, diarrhea and heartburn.  Genitourinary:  Negative for dysuria, frequency and urgency.  Musculoskeletal:  Negative for back pain, falls, joint pain and myalgias.  Skin: Negative.   Neurological:  Negative for dizziness and headaches.  Psychiatric/Behavioral:  Negative for depression and memory loss. The patient does not have insomnia.     Past Medical History:   Diagnosis Date   History of skin cancer    Dr.John Hall   Hyperlipidemia    Hypertension    Osteoporosis    Past Surgical History:  Procedure Laterality Date   ABDOMINAL HYSTERECTOMY  1998   Social History:   reports that she quit smoking about 19 years ago. Her smoking use included cigarettes. She smoked an average of 1 pack per day. She has never used smokeless tobacco. She reports that she does not drink alcohol and does not use drugs.  Family History  Problem Relation Age of Onset   Cancer Father    Dementia Sister    Breast cancer Maternal Grandmother    Breast cancer Paternal Grandmother     Medications: Patient's Medications  New Prescriptions   No medications on file  Previous Medications   ASPIRIN 81 MG TABLET    Take 81 mg by mouth daily.   CHOLECALCIFEROL (VITAMIN D3) 25 MCG (1000 UT) CAPS    Take 1 capsule (1,000 Units total) by mouth daily.   DENOSUMAB (PROLIA) 60 MG/ML SOSY INJECTION    Inject 60 mg into the skin every 6 (six) months.   DONEPEZIL (ARICEPT) 5 MG TABLET    TAKE 1 TABLET(5 MG) BY MOUTH DAILY   FEEDING SUPPLEMENT (BOOST HIGH PROTEIN) LIQD    Take 1 Container by mouth daily.   LISINOPRIL (ZESTRIL) 10 MG TABLET    TAKE 1 TABLET(10 MG) BY MOUTH DAILY   SIMVASTATIN (ZOCOR) 20 MG TABLET    TAKE 1 TABLET BY MOUTH EVERY DAY  VITAMIN D, ERGOCALCIFEROL, (DRISDOL) 1.25 MG (50000 UNIT) CAPS CAPSULE    TAKE 1 CAPSULE BY MOUTH 1 TIME A WEEK  Modified Medications   No medications on file  Discontinued Medications   No medications on file    Physical Exam:  Vitals:   06/27/22 0800  BP: 134/80  Pulse: 70  Temp: 97.9 F (36.6 C)  TempSrc: Temporal  SpO2: 97%  Weight: 124 lb (56.2 kg)  Height: 5' 5.5" (1.664 m)   Body mass index is 20.32 kg/m. Wt Readings from Last 3 Encounters:  06/27/22 124 lb (56.2 kg)  05/20/22 127 lb 1.6 oz (57.7 kg)  03/24/22 128 lb (58.1 kg)    Physical Exam Constitutional:      General: She is not in acute  distress.    Appearance: She is well-developed. She is not diaphoretic.  HENT:     Head: Normocephalic and atraumatic.     Mouth/Throat:     Pharynx: No oropharyngeal exudate.  Eyes:     Conjunctiva/sclera: Conjunctivae normal.     Pupils: Pupils are equal, round, and reactive to light.  Cardiovascular:     Rate and Rhythm: Normal rate and regular rhythm.     Heart sounds: Normal heart sounds.  Pulmonary:     Effort: Pulmonary effort is normal.     Breath sounds: Normal breath sounds.  Abdominal:     General: Bowel sounds are normal.     Palpations: Abdomen is soft.  Musculoskeletal:     Cervical back: Normal range of motion and neck supple.     Right lower leg: No edema.     Left lower leg: No edema.  Skin:    General: Skin is warm and dry.  Neurological:     Mental Status: She is alert. Mental status is at baseline.  Psychiatric:        Mood and Affect: Mood normal.    Labs reviewed: Basic Metabolic Panel: Recent Labs    10/01/21 0918 01/21/22 0844 05/13/22 0857  NA 138 138 137  K 4.2 4.1 4.0  CL 107 104 105  CO2 '26 30 28  '$ GLUCOSE 88 75 83  BUN '17 23 19  '$ CREATININE 0.94 1.19* 0.96  CALCIUM 9.7 9.8 10.0   Liver Function Tests: Recent Labs    10/01/21 0918 01/21/22 0844 05/13/22 0857  AST 15 18 14*  ALT '8 10 7  '$ ALKPHOS 44 44 36*  BILITOT 0.4 0.2* 0.2*  PROT 10.3* 9.8* 9.8*  ALBUMIN 3.3* 3.6 3.5   No results for input(s): "LIPASE", "AMYLASE" in the last 8760 hours. No results for input(s): "AMMONIA" in the last 8760 hours. CBC: Recent Labs    10/01/21 0918 01/21/22 0844 05/13/22 0857  WBC 4.2 3.6* 3.4*  NEUTROABS 2.0 1.5* 1.9  HGB 10.6* 10.3* 10.2*  HCT 34.3* 33.2* 32.4*  MCV 86.6 87.8 87.8  PLT 211 171 187   Lipid Panel: Recent Labs    07/22/21 0949  CHOL 139  HDL 43*  LDLCALC 79  TRIG 91  CHOLHDL 3.2   TSH: No results for input(s): "TSH" in the last 8760 hours. A1C: No results found for: "HGBA1C"   Assessment/Plan 1.  HYPERTENSION, BENIGN ESSENTIAL -Blood pressure well controlled Continue current medications Recheck metabolic panel  2. Dementia without behavioral disturbance (HCC) -Stable, no acute changes in cognitive or functional status, continue supportive care.   3. Osteoporosis, unspecified osteoporosis type, unspecified pathological fracture presence -Recommended to take calcium 600 mg twice daily with Vitamin D  2000 units daily and weight bearing activity 30 mins/5 days a week -continue on prolia   4. Vitamin D deficiency -on supplement, reports she is taking vit d weekly.  - Vitamin D, 25-hydroxy  5. Weight loss -encouraged to liberalize diet. To have protein supplement in addition to smallest meal of the day.  6. Smoldering myeloma -stable, followed by oncology.   7. Mixed hyperlipidemia -continues on zocor.  - Lipid panel   Return in about 4 months (around 10/28/2022) for routine follow up . Carlos American. [place], [facility] [phone]

## 2022-06-27 ENCOUNTER — Ambulatory Visit (INDEPENDENT_AMBULATORY_CARE_PROVIDER_SITE_OTHER): Payer: Medicare Other | Admitting: Nurse Practitioner

## 2022-06-27 ENCOUNTER — Encounter: Payer: Self-pay | Admitting: Nurse Practitioner

## 2022-06-27 VITALS — BP 134/80 | HR 70 | Temp 97.9°F | Ht 65.5 in | Wt 124.0 lb

## 2022-06-27 DIAGNOSIS — F0390 Unspecified dementia without behavioral disturbance: Secondary | ICD-10-CM | POA: Diagnosis not present

## 2022-06-27 DIAGNOSIS — E782 Mixed hyperlipidemia: Secondary | ICD-10-CM

## 2022-06-27 DIAGNOSIS — M810 Age-related osteoporosis without current pathological fracture: Secondary | ICD-10-CM | POA: Diagnosis not present

## 2022-06-27 DIAGNOSIS — E559 Vitamin D deficiency, unspecified: Secondary | ICD-10-CM

## 2022-06-27 DIAGNOSIS — D472 Monoclonal gammopathy: Secondary | ICD-10-CM

## 2022-06-27 DIAGNOSIS — R634 Abnormal weight loss: Secondary | ICD-10-CM

## 2022-06-27 DIAGNOSIS — I10 Essential (primary) hypertension: Secondary | ICD-10-CM

## 2022-06-27 LAB — LIPID PANEL
Cholesterol: 134 mg/dL (ref <200)
HDL: 45 mg/dL — ABNORMAL LOW (ref >=50)
LDL Cholesterol (Calc): 73 mg/dL (calc)
Non-HDL Cholesterol (Calc): 89 mg/dL (calc) (ref <130)
Total CHOL/HDL Ratio: 3 (calc) (ref <5.0)
Triglycerides: 79 mg/dL (ref <150)

## 2022-06-27 LAB — VITAMIN D 25 HYDROXY (VIT D DEFICIENCY, FRACTURES): Vit D, 25-Hydroxy: 142 ng/mL — ABNORMAL HIGH (ref 30–100)

## 2022-07-10 ENCOUNTER — Other Ambulatory Visit: Payer: Self-pay | Admitting: *Deleted

## 2022-07-10 MED ORDER — DENOSUMAB 60 MG/ML ~~LOC~~ SOSY: 60.0000 mg | PREFILLED_SYRINGE | SUBCUTANEOUS | 0 refills | Status: DC

## 2022-07-10 NOTE — Telephone Encounter (Signed)
Patient gets her Prolia from the Pharmacy. Script sent for upcoming appointment.

## 2022-07-25 ENCOUNTER — Encounter: Payer: Medicare Other | Admitting: Nurse Practitioner

## 2022-07-25 ENCOUNTER — Ambulatory Visit: Payer: Medicare Other

## 2022-07-25 ENCOUNTER — Ambulatory Visit (INDEPENDENT_AMBULATORY_CARE_PROVIDER_SITE_OTHER): Payer: Medicare Other

## 2022-07-25 ENCOUNTER — Encounter: Payer: Self-pay | Admitting: Nurse Practitioner

## 2022-07-25 DIAGNOSIS — F0390 Unspecified dementia without behavioral disturbance: Secondary | ICD-10-CM

## 2022-07-25 DIAGNOSIS — M810 Age-related osteoporosis without current pathological fracture: Secondary | ICD-10-CM

## 2022-07-25 DIAGNOSIS — I10 Essential (primary) hypertension: Secondary | ICD-10-CM

## 2022-07-25 DIAGNOSIS — E559 Vitamin D deficiency, unspecified: Secondary | ICD-10-CM | POA: Diagnosis not present

## 2022-07-25 MED ORDER — DENOSUMAB 60 MG/ML ~~LOC~~ SOSY
60.0000 mg | PREFILLED_SYRINGE | Freq: Once | SUBCUTANEOUS | Status: AC
  Administered 2022-07-25: 60 mg via SUBCUTANEOUS

## 2022-07-28 NOTE — Progress Notes (Signed)
Err

## 2022-08-18 ENCOUNTER — Encounter: Payer: Medicare Other | Admitting: Nurse Practitioner

## 2022-08-19 ENCOUNTER — Telehealth: Payer: Self-pay

## 2022-08-19 ENCOUNTER — Encounter: Payer: Self-pay | Admitting: Nurse Practitioner

## 2022-08-19 ENCOUNTER — Ambulatory Visit (INDEPENDENT_AMBULATORY_CARE_PROVIDER_SITE_OTHER): Payer: Medicare Other | Admitting: Nurse Practitioner

## 2022-08-19 DIAGNOSIS — Z0000 Encounter for general adult medical examination without abnormal findings: Secondary | ICD-10-CM

## 2022-08-19 NOTE — Telephone Encounter (Signed)
Jasmine Fletcher, Jasmine Fletcher are scheduled for a virtual visit with your provider today.    Just as we do with appointments in the office, we must obtain your consent to participate.  Your consent will be active for this visit and any virtual visit you may have with one of our providers in the next 365 days.    If you have a MyChart account, I can also send a copy of this consent to you electronically.  All virtual visits are billed to your insurance company just like a traditional visit in the office.  As this is a virtual visit, video technology does not allow for your provider to perform a traditional examination.  This may limit your provider's ability to fully assess your condition.  If your provider identifies any concerns that need to be evaluated in person or the need to arrange testing such as labs, EKG, etc, we will make arrangements to do so.    Although advances in technology are sophisticated, we cannot ensure that it will always work on either your end or our end.  If the connection with a video visit is poor, we may have to switch to a telephone visit.  With either a video or telephone visit, we are not always able to ensure that we have a secure connection.   I need to obtain your verbal consent now.   Are you willing to proceed with your visit today?   Jasmine Fletcher has provided verbal consent on 08/19/2022 for a virtual visit (video or telephone).   Carroll Kinds, CMA 08/19/2022  9:02 AM

## 2022-08-19 NOTE — Progress Notes (Signed)
Subjective:   Jasmine Fletcher is a 88 y.o. female who presents for Medicare Annual (Subsequent) preventive examination.  Review of Systems     Cardiac Risk Factors include: advanced age (>44mn, >>57women);hypertension;dyslipidemia     Objective:    There were no vitals filed for this visit. There is no height or weight on file to calculate BMI.     08/19/2022    8:52 AM 06/27/2022    7:58 AM 03/24/2022    8:43 AM 02/10/2022    9:52 AM 11/11/2021    9:06 AM 08/07/2021    9:38 AM 07/22/2021    9:01 AM  Advanced Directives  Does Patient Have a Medical Advance Directive? Yes Yes Yes Yes Yes Yes Yes  Type of Advance Directive Out of facility DNR (pink MOST or yellow form) Out of facility DNR (pink MOST or yellow form) Out of facility DNR (pink MOST or yellow form) Out of facility DNR (pink MOST or yellow form) Out of facility DNR (pink MOST or yellow form) Out of facility DNR (pink MOST or yellow form) Out of facility DNR (pink MOST or yellow form)  Does patient want to make changes to medical advance directive? No - Patient declined No - Patient declined No - Patient declined No - Patient declined No - Patient declined No - Patient declined No - Patient declined  Pre-existing out of facility DNR order (yellow form or pink MOST form) Pink MOST form placed in chart (order not valid for inpatient use);Yellow form placed in chart (order not valid for inpatient use) Yellow form placed in chart (order not valid for inpatient use);Pink MOST form placed in chart (order not valid for inpatient use) Yellow form placed in chart (order not valid for inpatient use);Pink MOST form placed in chart (order not valid for inpatient use) Yellow form placed in chart (order not valid for inpatient use);Pink MOST form placed in chart (order not valid for inpatient use) Yellow form placed in chart (order not valid for inpatient use);Pink MOST form placed in chart (order not valid for inpatient use)  Yellow form placed in  chart (order not valid for inpatient use);Pink MOST form placed in chart (order not valid for inpatient use)    Current Medications (verified) Outpatient Encounter Medications as of 08/19/2022  Medication Sig   aspirin 81 MG tablet Take 81 mg by mouth daily.   denosumab (PROLIA) 60 MG/ML SOSY injection Inject 60 mg into the skin every 6 (six) months.   feeding supplement (BOOST HIGH PROTEIN) LIQD Take 1 Container by mouth daily.   lisinopril (ZESTRIL) 10 MG tablet TAKE 1 TABLET(10 MG) BY MOUTH DAILY   simvastatin (ZOCOR) 20 MG tablet TAKE 1 TABLET BY MOUTH EVERY DAY   [DISCONTINUED] Cholecalciferol (VITAMIN D3) 25 MCG (1000 UT) CAPS Take 1 capsule (1,000 Units total) by mouth daily. (Patient not taking: Reported on 08/19/2022)   [DISCONTINUED] donepezil (ARICEPT) 5 MG tablet TAKE 1 TABLET(5 MG) BY MOUTH DAILY (Patient not taking: Reported on 08/19/2022)   No facility-administered encounter medications on file as of 08/19/2022.    Allergies (verified) Patient has no known allergies.   History: Past Medical History:  Diagnosis Date   History of skin cancer    Dr.John Hall   Hyperlipidemia    Hypertension    Osteoporosis    Past Surgical History:  Procedure Laterality Date   ABDOMINAL HYSTERECTOMY  1998   Family History  Problem Relation Age of Onset   Cancer Father  Dementia Sister    Breast cancer Maternal Grandmother    Breast cancer Paternal Grandmother    Social History   Socioeconomic History   Marital status: Single    Spouse name: Not on file   Number of children: Not on file   Years of education: Not on file   Highest education level: Not on file  Occupational History   Not on file  Tobacco Use   Smoking status: Former    Packs/day: 1.00    Types: Cigarettes    Quit date: 12/08/2002    Years since quitting: 19.7   Smokeless tobacco: Never   Tobacco comments:    Quit t age 87   Vaping Use   Vaping Use: Never used  Substance and Sexual Activity   Alcohol  use: No    Alcohol/week: 0.0 standard drinks of alcohol   Drug use: No   Sexual activity: Not Currently  Other Topics Concern   Not on file  Social History Narrative   ** Merged History Encounter **       Social Determinants of Health   Financial Resource Strain: Low Risk  (06/04/2018)   Overall Financial Resource Strain (CARDIA)    Difficulty of Paying Living Expenses: Not hard at all  Food Insecurity: No Food Insecurity (06/04/2018)   Hunger Vital Sign    Worried About Running Out of Food in the Last Year: Never true    [place] in the Last Year: Never true  Transportation Needs: No Transportation Needs (06/04/2018)   PRAPARE - Hydrologist (Medical): No    Lack of Transportation (Non-Medical): No  Physical Activity: Insufficiently Active (06/04/2018)   Exercise Vital Sign    Days of Exercise per Week: 3 days    Minutes of Exercise per Session: 30 min  Stress: No Stress Concern Present (06/04/2018)   [place]    Feeling of Stress : Not at all  Social Connections: Moderately Isolated (06/04/2018)   Social Connection and Isolation Panel [NHANES]    Frequency of Communication with Friends and Family: Three times a week    Frequency of Social Gatherings with Friends and Family: Three times a week    Attends Religious Services: Never    Active Member of Clubs or Organizations: No    Attends Archivist Meetings: Never    Marital Status: Never married    Tobacco Counseling Counseling given: Not Answered Tobacco comments: Quit t age 50    Clinical Intake:  Pre-visit preparation completed: Yes  Pain : No/denies pain     BMI - recorded: 20 Nutritional Status: BMI of 19-24  Normal Nutritional Risks: Unintentional weight loss Diabetes: No  How often do you need to have someone help you when you read instructions, pamphlets, or other written materials from your  doctor or pharmacy?: 1 - Never  Diabetic?no         Activities of Daily Living    08/19/2022    9:09 AM  In your present state of health, do you have any difficulty performing the following activities:  Hearing? 0  Vision? 1  Difficulty concentrating or making decisions? 0  Walking or climbing stairs? 0  Dressing or bathing? 0  Doing errands, shopping? 1  Comment daughter helps  Preparing Food and eating ? N  Using the Toilet? N  In the past six months, have you accidently leaked urine? N  Do you have  problems with loss of bowel control? N  Managing your Medications? N  Comment daughter helps  Managing your Finances? N  Comment daughter helps  Housekeeping or managing your Housekeeping? Y  Comment daughter helps    Patient Care Team: Lauree Chandler, NP as PCP - General (Geriatric Medicine)  Indicate any recent Medical Services you may have received from other than [facility] providers in the past year (date may be approximate).     Assessment:   This is a routine wellness examination for [place].  Hearing/Vision screen Hearing Screening - Comments:: Patient has no hearing problems  Dietary issues and exercise activities discussed: Current Exercise Habits: Home exercise routine, Time (Minutes): 25, Frequency (Times/Week): 2, Weekly Exercise (Minutes/Week): 50   Goals Addressed   None   Depression Screen    08/19/2022    8:48 AM 06/27/2022    8:56 AM 02/10/2022    9:33 AM 11/11/2021    9:05 AM 08/07/2021    9:35 AM 01/14/2021    9:37 AM 10/19/2020   10:22 AM  PHQ 2/9 Scores  PHQ - 2 Score 0 0 0 0 0 0 0    Fall Risk    08/19/2022    8:50 AM 06/27/2022    8:56 AM 03/24/2022    9:07 AM 02/10/2022    9:33 AM 11/11/2021    9:05 AM  Fall Risk   Falls in the past year? 0 0 0 0 0  Number falls in past yr: 0 0 0 0 0  Injury with Fall? 0 0 0 0 0  Risk for fall due to : No Fall Risks No Fall Risks No Fall Risks No Fall Risks No Fall Risks  Follow up Falls evaluation  completed Falls evaluation completed Falls evaluation completed Falls evaluation completed Falls evaluation completed    [place]:  Any stairs in or around the home? No  If so, are there any without handrails?  na Home free of loose throw rugs in walkways, pet beds, electrical cords, etc? Yes  Adequate lighting in your home to reduce risk of falls? Yes   ASSISTIVE DEVICES UTILIZED TO PREVENT FALLS:  Life alert? No  Use of a cane, walker or w/c? Yes  Grab bars in the bathroom? Yes  Shower chair or bench in shower? Yes  Elevated toilet seat or a handicapped toilet? Yes   TIMED UP AND GO:  Was the test performed? No .    Cognitive Function:    08/07/2021    9:38 AM 06/14/2019    8:39 AM 06/04/2018    9:23 AM 05/27/2017   10:04 AM 03/14/2016    9:42 AM  MMSE - Mini Mental State Exam  Orientation to time '5 5 5 5 5  '$ Orientation to Place '5 4 5 5 5  '$ Registration '3 3 3 3 3  '$ Attention/ Calculation '2 2 3 '$ 0 4  Recall '2 2 2 3 2  '$ Language- name 2 objects '2 2 2 2 2  '$ Language- repeat '1 1 1 1 1  '$ Language- follow 3 step command '3 3 3 3 3  '$ Language- read & follow direction '1 1 1 1 1  '$ Write a sentence '1 1 1 1 1  '$ Copy design 0 0 0 0 0  Total score '25 24 26 24 27        '$ 08/19/2022    8:53 AM 06/14/2020    9:40 AM  6CIT Screen  What Year? 4 points 0  points  What month? 0 points 0 points  What time? 0 points 0 points  Count back from 20 0 points 4 points  Months in reverse 4 points 4 points  Repeat phrase 10 points 10 points  Total Score 18 points 18 points    Immunizations Immunization History  Administered Date(s) Administered   Fluad Quad(high Dose 65+) 10/17/2019, 10/19/2020, 08/07/2021   Influenza Whole 12/24/2006   Influenza,inj,Quad PF,6+ Mos 01/30/2015, 10/02/2017, 08/06/2018   Influenza-Unspecified 09/18/2011, 08/26/2013, 09/29/2016   PFIZER(Purple Top)SARS-COV-2 Vaccination 01/14/2020, 02/04/2020, 10/06/2020   Pneumococcal Conjugate-13  07/25/2014, 04/24/2015   Pneumococcal Polysaccharide-23 12/08/1996, 10/16/2011   Td 11/05/2005   Tdap 10/16/2011   Zoster, Live 04/24/2015    TDAP status: Due, Education has been provided regarding the importance of this vaccine. Advised may receive this vaccine at local pharmacy or Health Dept. Aware to provide a copy of the vaccination record if obtained from local pharmacy or Health Dept. Verbalized acceptance and understanding.  Flu Vaccine status: Due, Education has been provided regarding the importance of this vaccine. Advised may receive this vaccine at local pharmacy or Health Dept. Aware to provide a copy of the vaccination record if obtained from local pharmacy or Health Dept. Verbalized acceptance and understanding.  Pneumococcal vaccine status: Up to date  Covid-19 vaccine status: Information provided on how to obtain vaccines.   Qualifies for Shingles Vaccine? Yes   Zostavax completed No   Shingrix Completed?: No.    Education has been provided regarding the importance of this vaccine. Patient has been advised to call insurance company to determine out of pocket expense if they have not yet received this vaccine. Advised may also receive vaccine at local pharmacy or Health Dept. Verbalized acceptance and understanding.  Screening Tests Health Maintenance  Topic Date Due   Zoster Vaccines- Shingrix (1 of 2) Never done   COVID-19 Vaccine (4 - Pfizer risk series) 12/01/2020   TETANUS/TDAP  10/15/2021   INFLUENZA VACCINE  07/08/2022   Pneumonia Vaccine 81+ Years old  Completed   DEXA SCAN  Completed   HPV VACCINES  Aged Out    Health Maintenance  Health Maintenance Due  Topic Date Due   Zoster Vaccines- Shingrix (1 of 2) Never done   COVID-19 Vaccine (4 - Pfizer risk series) 12/01/2020   TETANUS/TDAP  10/15/2021   INFLUENZA VACCINE  07/08/2022    Colorectal cancer screening: No longer required.   Mammogram status: No longer required due to age.  Bone Density  status: Completed 12/18/21. Results reflect: Bone density results: OSTEOPOROSIS. Repeat every 2 years.  Lung Cancer Screening: (Low Dose CT Chest recommended if Age 58-80 years, 30 pack-year currently smoking OR have quit w/in 15years.) does not qualify.   Lung Cancer Screening Referral: na  Additional Screening:  Hepatitis C Screening: does not qualify; Completed na  Vision Screening: Recommended annual ophthalmology exams for early detection of glaucoma and other disorders of the eye. Is the patient up to date with their annual eye exam?  No  Who is the provider or what is the name of the office in which the patient attends annual eye exams? [facility] If pt is not established with a provider, would they like to be referred to a provider to establish care? No .   Dental Screening: Recommended annual dental exams for proper oral hygiene  Community Resource Referral / Chronic Care Management: CRR required this visit?  No   CCM required this visit?  No      Plan:  I have personally reviewed and noted the following in the patient's chart:   Medical and social history Use of alcohol, tobacco or illicit drugs  Current medications and supplements including opioid prescriptions. Patient is not currently taking opioid prescriptions. Functional ability and status Nutritional status Physical activity Advanced directives List of other physicians Hospitalizations, surgeries, and ER visits in previous 12 months Vitals Screenings to include cognitive, depression, and falls Referrals and appointments  In addition, I have reviewed and discussed with patient certain preventive protocols, quality metrics, and best practice recommendations. A written personalized care plan for preventive services as well as general preventive health recommendations were provided to patient.     Lauree Chandler, NP   08/19/2022    Virtual Visit via Telephone Note  I connected with patient  08/19/22 at  9:00 AM EDT by telephone and verified that I am speaking with the correct person using two identifiers.  Location: Patient: home Provider: [facility]    I discussed the limitations, risks, security and privacy concerns of performing an evaluation and management service by telephone and the availability of in person appointments. I also discussed with the patient that there may be a patient responsible charge related to this service. The patient expressed understanding and agreed to proceed.   I discussed the assessment and treatment plan with the patient. The patient was provided an opportunity to ask questions and all were answered. The patient agreed with the plan and demonstrated an understanding of the instructions.   The patient was advised to call back or seek an in-person evaluation if the symptoms worsen or if the condition fails to improve as anticipated.  I provided 15 minutes of non-face-to-face time during this encounter.  Carlos American. Harle Battiest [facility] printed and mailed

## 2022-08-19 NOTE — Progress Notes (Signed)
This service is provided via telemedicine  No vital signs collected/recorded due to the encounter was a telemedicine visit.   Location of patient (ex: home, work):  Home  Patient consents to a telephone visit:  Yes, see encounter dated 08/19/2022  Location of the provider (ex: office, home):  [place]  Name of any referring provider:  N/A  Names of all persons participating in the telemedicine service and their role in the encounter:  Sherrie Mustache, Nurse Practitioner, Carroll Kinds, CMA, and patient.   Time spent on call:  10 minutes with medical assistant

## 2022-08-19 NOTE — Patient Instructions (Signed)
Jasmine Fletcher , Thank you for taking time to come for your Medicare Wellness Visit. I appreciate your ongoing commitment to your health goals. Please review the following plan we discussed and let me know if I can assist you in the future.   Screening recommendations/referrals: Colonoscopy aged out Mammogram aged out  Bone Density up to date Recommended yearly ophthalmology/optometry visit for glaucoma screening and checkup Recommended yearly dental visit for hygiene and checkup  Vaccinations: Influenza vaccine- due annually in September/October Pneumococcal vaccine up to date Tdap vaccine DUE- recommend to get at your local pharmacy Shingles vaccine DUE- recommend to get at your local pharmacy    Advanced directives: on file.   Conditions/risks identified: advance age, memory loss  Next appointment: yearly    Preventive Care 37 Years and Older, Female Preventive care refers to lifestyle choices and visits with your health care provider that can promote health and wellness. What does preventive care include? A yearly physical exam. This is also called an annual well check. Dental exams once or twice a year. Routine eye exams. Ask your health care provider how often you should have your eyes checked. Personal lifestyle choices, including: Daily care of your teeth and gums. Regular physical activity. Eating a healthy diet. Avoiding tobacco and drug use. Limiting alcohol use. Practicing safe sex. Taking low-dose aspirin every day. Taking vitamin and mineral supplements as recommended by your health care provider. What happens during an annual well check? The services and screenings done by your health care provider during your annual well check will depend on your age, overall health, lifestyle risk factors, and family history of disease. Counseling  Your health care provider may ask you questions about your: Alcohol use. Tobacco use. Drug use. Emotional well-being. Home and  relationship well-being. Sexual activity. Eating habits. History of falls. Memory and ability to understand (cognition). Work and work Statistician. Reproductive health. Screening  You may have the following tests or measurements: Height, weight, and BMI. Blood pressure. Lipid and cholesterol levels. These may be checked every 5 years, or more frequently if you are over 9 years old. Skin check. Lung cancer screening. You may have this screening every year starting at age 30 if you have a 30-pack-year history of smoking and currently smoke or have quit within the past 15 years. Fecal occult blood test (FOBT) of the stool. You may have this test every year starting at age 25. Flexible sigmoidoscopy or colonoscopy. You may have a sigmoidoscopy every 5 years or a colonoscopy every 10 years starting at age 22. Hepatitis C blood test. Hepatitis B blood test. Sexually transmitted disease (STD) testing. Diabetes screening. This is done by checking your blood sugar (glucose) after you have not eaten for a while (fasting). You may have this done every 1-3 years. Bone density scan. This is done to screen for osteoporosis. You may have this done starting at age 53. Mammogram. This may be done every 1-2 years. Talk to your health care provider about how often you should have regular mammograms. Talk with your health care provider about your test results, treatment options, and if necessary, the need for more tests. Vaccines  Your health care provider may recommend certain vaccines, such as: Influenza vaccine. This is recommended every year. Tetanus, diphtheria, and acellular pertussis (Tdap, Td) vaccine. You may need a Td booster every 10 years. Zoster vaccine. You may need this after age 41. Pneumococcal 13-valent conjugate (PCV13) vaccine. One dose is recommended after age 53. Pneumococcal polysaccharide (PPSV23) vaccine.  One dose is recommended after age 88. Talk to your health care provider  about which screenings and vaccines you need and how often you need them. This information is not intended to replace advice given to you by your health care provider. Make sure you discuss any questions you have with your health care provider. Document Released: 12/21/2015 Document Revised: 08/13/2016 Document Reviewed: 09/25/2015 Elsevier Interactive Patient Education  2017 [place] Prevention in the Home Falls can cause injuries. They can happen to people of all ages. There are many things you can do to make your home safe and to help prevent falls. What can I do on the outside of my home? Regularly fix the edges of walkways and driveways and fix any cracks. Remove anything that might make you trip as you walk through a door, such as a raised step or threshold. Trim any bushes or trees on the path to your home. Use bright outdoor lighting. Clear any walking paths of anything that might make someone trip, such as rocks or tools. Regularly check to see if handrails are loose or broken. Make sure that both sides of any steps have handrails. Any raised decks and porches should have guardrails on the edges. Have any leaves, snow, or ice cleared regularly. Use sand or salt on walking paths during winter. Clean up any spills in your garage right away. This includes oil or grease spills. What can I do in the bathroom? Use night lights. Install grab bars by the toilet and in the tub and shower. Do not use towel bars as grab bars. Use non-skid mats or decals in the tub or shower. If you need to sit down in the shower, use a plastic, non-slip stool. Keep the floor dry. Clean up any water that spills on the floor as soon as it happens. Remove soap buildup in the tub or shower regularly. Attach bath mats securely with double-sided non-slip rug tape. Do not have throw rugs and other things on the floor that can make you trip. What can I do in the bedroom? Use night lights. Make sure  that you have a light by your bed that is easy to reach. Do not use any sheets or blankets that are too big for your bed. They should not hang down onto the floor. Have a firm chair that has side arms. You can use this for support while you get dressed. Do not have throw rugs and other things on the floor that can make you trip. What can I do in the kitchen? Clean up any spills right away. Avoid walking on wet floors. Keep items that you use a lot in easy-to-reach places. If you need to reach something above you, use a strong step stool that has a grab bar. Keep electrical cords out of the way. Do not use floor polish or wax that makes floors slippery. If you must use wax, use non-skid floor wax. Do not have throw rugs and other things on the floor that can make you trip. What can I do with my stairs? Do not leave any items on the stairs. Make sure that there are handrails on both sides of the stairs and use them. Fix handrails that are broken or loose. Make sure that handrails are as long as the stairways. Check any carpeting to make sure that it is firmly attached to the stairs. Fix any carpet that is loose or worn. Avoid having throw rugs at the top or bottom of  the stairs. If you do have throw rugs, attach them to the floor with carpet tape. Make sure that you have a light switch at the top of the stairs and the bottom of the stairs. If you do not have them, ask someone to add them for you. What else can I do to help prevent falls? Wear shoes that: Do not have high heels. Have rubber bottoms. Are comfortable and fit you well. Are closed at the toe. Do not wear sandals. If you use a stepladder: Make sure that it is fully opened. Do not climb a closed stepladder. Make sure that both sides of the stepladder are locked into place. Ask someone to hold it for you, if possible. Clearly mark and make sure that you can see: Any grab bars or handrails. First and last steps. Where the edge of  each step is. Use tools that help you move around (mobility aids) if they are needed. These include: Canes. Walkers. Scooters. Crutches. Turn on the lights when you go into a dark area. Replace any light bulbs as soon as they burn out. Set up your furniture so you have a clear path. Avoid moving your furniture around. If any of your floors are uneven, fix them. If there are any pets around you, be aware of where they are. Review your medicines with your doctor. Some medicines can make you feel dizzy. This can increase your chance of falling. Ask your doctor what other things that you can do to help prevent falls. This information is not intended to replace advice given to you by your health care provider. Make sure you discuss any questions you have with your health care provider. Document Released: 09/20/2009 Document Revised: 05/01/2016 Document Reviewed: 12/29/2014 Elsevier Interactive Patient Education  2017 [employer].

## 2022-09-11 ENCOUNTER — Other Ambulatory Visit: Payer: Self-pay

## 2022-09-11 DIAGNOSIS — D472 Monoclonal gammopathy: Secondary | ICD-10-CM

## 2022-09-12 ENCOUNTER — Other Ambulatory Visit: Payer: Self-pay

## 2022-09-12 ENCOUNTER — Inpatient Hospital Stay: Payer: Medicare Other | Attending: Hematology

## 2022-09-12 DIAGNOSIS — D472 Monoclonal gammopathy: Secondary | ICD-10-CM | POA: Diagnosis not present

## 2022-09-12 LAB — CMP (CANCER CENTER ONLY)
ALT: 8 U/L (ref 0–44)
AST: 14 U/L — ABNORMAL LOW (ref 15–41)
Albumin: 3.4 g/dL — ABNORMAL LOW (ref 3.5–5.0)
Alkaline Phosphatase: 40 U/L (ref 38–126)
Anion gap: 3 — ABNORMAL LOW (ref 5–15)
BUN: 23 mg/dL (ref 8–23)
CO2: 28 mmol/L (ref 22–32)
Calcium: 9.3 mg/dL (ref 8.9–10.3)
Chloride: 107 mmol/L (ref 98–111)
Creatinine: 0.91 mg/dL (ref 0.44–1.00)
GFR, Estimated: >60 mL/min (ref >60)
Glucose, Bld: 85 mg/dL (ref 70–99)
Potassium: 4.6 mmol/L (ref 3.5–5.1)
Sodium: 138 mmol/L (ref 135–145)
Total Bilirubin: 0.2 mg/dL — ABNORMAL LOW (ref 0.3–1.2)
Total Protein: 10.3 g/dL — ABNORMAL HIGH (ref 6.5–8.1)

## 2022-09-12 LAB — CBC WITH DIFFERENTIAL (CANCER CENTER ONLY)
Abs Immature Granulocytes: 0.01 10*3/uL (ref 0.00–0.07)
Basophils Absolute: 0 10*3/uL (ref 0.0–0.1)
Basophils Relative: 1 %
Eosinophils Absolute: 0 10*3/uL (ref 0.0–0.5)
Eosinophils Relative: 1 %
HCT: 33.4 % — ABNORMAL LOW (ref 36.0–46.0)
Hemoglobin: 10.6 g/dL — ABNORMAL LOW (ref 12.0–15.0)
Immature Granulocytes: 0 %
Lymphocytes Relative: 29 %
Lymphs Abs: 1.1 10*3/uL (ref 0.7–4.0)
MCH: 27.8 pg (ref 26.0–34.0)
MCHC: 31.7 g/dL (ref 30.0–36.0)
MCV: 87.7 fL (ref 80.0–100.0)
Monocytes Absolute: 0.5 10*3/uL (ref 0.1–1.0)
Monocytes Relative: 12 %
Neutro Abs: 2.1 10*3/uL (ref 1.7–7.7)
Neutrophils Relative %: 57 %
Platelet Count: 167 10*3/uL (ref 150–400)
RBC: 3.81 MIL/uL — ABNORMAL LOW (ref 3.87–5.11)
RDW: 15.4 % (ref 11.5–15.5)
WBC Count: 3.7 10*3/uL — ABNORMAL LOW (ref 4.0–10.5)
nRBC: 0 % (ref 0.0–0.2)

## 2022-09-19 ENCOUNTER — Inpatient Hospital Stay: Payer: Medicare Other | Admitting: Hematology

## 2022-09-19 LAB — MULTIPLE MYELOMA PANEL, SERUM
Albumin SerPl Elph-Mcnc: 3.8 g/dL (ref 2.9–4.4)
Albumin/Glob SerPl: 0.7 (ref 0.7–1.7)
Alpha 1: 0.3 g/dL (ref 0.0–0.4)
Alpha2 Glob SerPl Elph-Mcnc: 0.7 g/dL (ref 0.4–1.0)
B-Globulin SerPl Elph-Mcnc: 0.9 g/dL (ref 0.7–1.3)
Gamma Glob SerPl Elph-Mcnc: 3.9 g/dL — ABNORMAL HIGH (ref 0.4–1.8)
Globulin, Total: 5.9 g/dL — ABNORMAL HIGH (ref 2.2–3.9)
IgA: 56 mg/dL — ABNORMAL LOW (ref 64–422)
IgG (Immunoglobin G), Serum: 5155 mg/dL — ABNORMAL HIGH (ref 586–1602)
IgM (Immunoglobulin M), Srm: 12 mg/dL — ABNORMAL LOW (ref 26–217)
M Protein SerPl Elph-Mcnc: 3.3 g/dL — ABNORMAL HIGH
Total Protein ELP: 9.7 g/dL — ABNORMAL HIGH (ref 6.0–8.5)

## 2022-09-22 ENCOUNTER — Inpatient Hospital Stay (HOSPITAL_BASED_OUTPATIENT_CLINIC_OR_DEPARTMENT_OTHER): Payer: Medicare Other | Admitting: Hematology

## 2022-09-22 DIAGNOSIS — D472 Monoclonal gammopathy: Secondary | ICD-10-CM | POA: Diagnosis not present

## 2022-09-22 NOTE — Progress Notes (Signed)
HEMATOLOGY/ONCOLOGY PHONE VISIT NOTE  Date of Service: 09/22/22     Patient Care Team: Lauree Chandler, NP as PCP - General (Geriatric Medicine)   CHIEF COMPLAINTS:  Follow-up for continued evaluation and management of smoldering myeloma   HISTORY OF PRESENTING ILLNESS:   Jasmine Fletcher is a wonderful 88 y.o. female who has been referred to [place] by Lauree Chandler, NP for evaluation and management of elevated serum globulin levels. The pt reports that she is doing well overall.  The pt reports that her appetite has decreased recently but that she eats a healthy diet. She notes mild fatigue. Denies new bone pain, back pain, hip pain, infection concerns, and any other new concerns.  She has been getting a Prolia shot every 6 months for the past 2 years. She has also been taking Aricept but notes that her memory is worsening. Her daughter is her health care POA, but the pt makes her own healthcare decisions at this time.  Most recent lab results (06/14/2019) of CBC is as follows: all values are WNL except for HGB at 11.4, MCH at 26.8. 06/21/2019 UPEP revealed protein/creat ratio at 209, total protein at 29, and two abnormal protein bands 06/14/2019 SPEP revealed total protein at 10.1, Alpha 2 at 1.0, gamma globulin at 4.1, abnormal protein band1 at 3.9    INTERVAL HISTORY:  .Marland KitchenI connected with Jasmine Fletcher on 09/22/2022 at  8:40 AM EDT by telephone visit and verified that I am speaking with the correct person using two identifiers.   I discussed the limitations, risks, security and privacy concerns of performing an evaluation and management service by telemedicine and the availability of in-person appointments. I also discussed with the patient that there may be a patient responsible charge related to this service. The patient expressed understanding and agreed to proceed.   Other persons participating in the visit and their role in the encounter: patients daughter    Patient's location: home  Provider's location: [facility]   Chief Complaint: f/u for smoldering myeloma   Jasmine Fletcher is a 88 y.o. female is here for continued valuation and management of smoldering myeloma.  Patient was last seen by me on 05/20/2022 and she had mild fatigue but doing well overall.   She reports of mild fatigue but doing well overall during today's phone visit. She denies of any acute symptoms, night sweats, focal bone pain, fevers, and chills.  No unexpected weight loss. No abnormal bleeding or bruising. No infection issues. Labs done were reviewed in details with the patient.  MEDICAL HISTORY:  Past Medical History:  Diagnosis Date   History of skin cancer    Dr.John Hall   Hyperlipidemia    Hypertension    Osteoporosis     SURGICAL HISTORY: Past Surgical History:  Procedure Laterality Date   ABDOMINAL HYSTERECTOMY  1998    SOCIAL HISTORY: Social History   Socioeconomic History   Marital status: Single    Spouse name: Not on file   Number of children: Not on file   Years of education: Not on file   Highest education level: Not on file  Occupational History   Not on file  Tobacco Use   Smoking status: Former    Packs/day: 1.00    Types: Cigarettes    Quit date: 12/08/2002    Years since quitting: 19.8   Smokeless tobacco: Never   Tobacco comments:    Quit t age 35   Vaping Use  Vaping Use: Never used  Substance and Sexual Activity   Alcohol use: No    Alcohol/week: 0.0 standard drinks of alcohol   Drug use: No   Sexual activity: Not Currently  Other Topics Concern   Not on file  Social History Narrative   ** Merged History Encounter **       Social Determinants of Health   Financial Resource Strain: Low Risk  (06/04/2018)   Overall Financial Resource Strain (CARDIA)    Difficulty of Paying Living Expenses: Not hard at all  Food Insecurity: No Food Insecurity (06/04/2018)   Hunger Vital Sign    Worried About Running Out of Food in  the Last Year: Never true    Ran Out of Food in the Last Year: Never true  Transportation Needs: No Transportation Needs (06/04/2018)   PRAPARE - Hydrologist (Medical): No    Lack of Transportation (Non-Medical): No  Physical Activity: Insufficiently Active (06/04/2018)   Exercise Vital Sign    Days of Exercise per Week: 3 days    Minutes of Exercise per Session: 30 min  Stress: No Stress Concern Present (06/04/2018)   [place]    Feeling of Stress : Not at all  Social Connections: Moderately Isolated (06/04/2018)   Social Connection and Isolation Panel [NHANES]    Frequency of Communication with Friends and Family: Three times a week    Frequency of Social Gatherings with Friends and Family: Three times a week    Attends Religious Services: Never    Active Member of Clubs or Organizations: No    Attends Archivist Meetings: Never    Marital Status: Never married  Intimate Partner Violence: Not At Risk (06/04/2018)   Humiliation, Afraid, Rape, and Kick questionnaire    Fear of Current or Ex-Partner: No    Emotionally Abused: No    Physically Abused: No    Sexually Abused: No    FAMILY HISTORY: Family History  Problem Relation Age of Onset   Cancer Father    Dementia Sister    Breast cancer Maternal Grandmother    Breast cancer Paternal Grandmother     ALLERGIES:  has No Known Allergies.  MEDICATIONS:  Current Outpatient Medications  Medication Sig Dispense Refill   aspirin 81 MG tablet Take 81 mg by mouth daily.     denosumab (PROLIA) 60 MG/ML SOSY injection Inject 60 mg into the skin every 6 (six) months. 60 mL 0   feeding supplement (BOOST HIGH PROTEIN) LIQD Take 1 Container by mouth daily.     lisinopril (ZESTRIL) 10 MG tablet TAKE 1 TABLET(10 MG) BY MOUTH DAILY 90 tablet 3   simvastatin (ZOCOR) 20 MG tablet TAKE 1 TABLET BY MOUTH EVERY DAY 90 tablet 1   No current  facility-administered medications for this visit.    REVIEW OF SYSTEMS:   10 Point review of Systems was done is negative except as noted above.  PHYSICAL EXAMINATION: There were no vitals filed for this visit.   Wt Readings from Last 3 Encounters:  06/27/22 124 lb (56.2 kg)  05/20/22 127 lb 1.6 oz (57.7 kg)  03/24/22 128 lb (58.1 kg)   There is no height or weight on file to calculate BMI.    NAD GENERAL:alert, in no acute distress and comfortable SKIN: no acute rashes, no significant lesions EYES: conjunctiva are pink and non-injected, sclera anicteric OROPHARYNX: MMM, no exudates, no oropharyngeal erythema or ulceration NECK:  supple, no JVD LYMPH:  no palpable lymphadenopathy in the cervical, axillary or inguinal regions LUNGS: clear to auscultation b/l with normal respiratory effort HEART: regular rate & rhythm ABDOMEN:  normoactive bowel sounds , non tender, not distended. Extremity: no pedal edema PSYCH: alert & oriented x 3 with fluent speech NEURO: no focal motor/sensory deficits    LABORATORY DATA:  I have reviewed the data as listed  .    Latest Ref Rng & Units 09/12/2022    9:11 AM 05/13/2022    8:57 AM 01/21/2022    8:44 AM  CBC  WBC 4.0 - 10.5 K/uL 3.7  3.4  3.6   Hemoglobin 12.0 - 15.0 g/dL 10.6  10.2  10.3   Hematocrit 36.0 - 46.0 % 33.4  32.4  33.2   Platelets 150 - 400 K/uL 167  187  171     .    Latest Ref Rng & Units 09/12/2022    9:11 AM 05/13/2022    8:57 AM 01/21/2022    8:44 AM  CMP  Glucose 70 - 99 mg/dL 85  83  75   BUN 8 - 23 mg/dL _0 Creatinine 0.44 - 1.00 mg/dL 0.91  0.96  1.19   Sodium 135 - 145 mmol/L 138  137  138   Potassium 3.5 - 5.1 mmol/L 4.6  4.0  4.1   Chloride 98 - 111 mmol/L 107  105  104   CO2 22 - 32 mmol/L _1 Calcium 8.9 - 10.3 mg/dL 9.3  10.0  9.8   Total Protein 6.5 - 8.1 g/dL 10.3  9.8  9.8   Total Bilirubin 0.3 - 1.2 mg/dL 0.2  0.2  0.2   Alkaline Phos 38 - 126 U/L 40  36  44   AST 15 - 41 U/L  _2 ALT 0 - 44 U/L _3 Component     Latest Ref Rng & Units 06/21/2019 07/28/2019 08/01/2019  Total Protein, Urine-UPE24     Not Estab. mg/dL   8.2  Total Protein, Urine-Ur/day     30 - 150 mg/24 hr   180 (H)  ALBUMIN, U     %   19.7  ALPHA 1 URINE     %   1.5  Alpha 2, Urine     %   6.0  % BETA, Urine     %   21.4  GAMMA GLOBULIN URINE     %   51.4  Free Kappa Lt Chains,Ur     0.63 - 113.79 mg/L   383.74 (H)  Free Lambda Lt Chains,Ur     0.47 - 11.77 mg/L   0.68  Free Kappa/Lambda Ratio     1.03 - 31.76   564.32 (H)  Immunofixation Result, Urine        Comment  Total Volume        2,200  M-SPIKE %, Urine     Not Observed %   28.3 (H)  M-Spike, mg/24 hr     Not Observed mg/24 hr   51 (H)  NOTE:        Comment  IgG (Immunoglobin G), Serum     586 - 1,602 mg/dL  5,351 (H)   IgA     64 - 422 mg/dL  99   IgM (Immunoglobulin M), Srm     26 -  217 mg/dL  15 (L)   Total Protein ELP     6.0 - 8.5 g/dL  10.2 (H)   Albumin SerPl Elph-Mcnc     2.9 - 4.4 g/dL  4.1   Alpha 1     0.0 - 0.4 g/dL  0.3   Alpha2 Glob SerPl Elph-Mcnc     0.4 - 1.0 g/dL  0.8   B-Globulin SerPl Elph-Mcnc     0.7 - 1.3 g/dL  1.1   Gamma Glob SerPl Elph-Mcnc     0.4 - 1.8 g/dL  3.9 (H)   M Protein SerPl Elph-Mcnc     Not Observed g/dL  3.4 (H)   Globulin, Total     2.2 - 3.9 g/dL  6.1 (H)   Albumin/Glob SerPl     0.7 - 1.7  0.7   IFE 1       Comment   Please Note (HCV):       Comment   Creatinine, Urine     20 - 275 mg/dL 139    Protein/Creat Ratio     21 - 161 mg/g creat 209 (H)    Protein/Creatinine Ratio     0.021 - 0.16 mg/mg creat 0.209 (H)    Total Protein, Urine     5 - 24 mg/dL 29 (H)    Albumin     % 44    Alpha-1-Globulin, U     % 3    ALPHA-2-GLOBULIN, U     % 12    Beta Globulin, U     % 9    Gamma Globulin, U     % 33    Abnormal Protein Band     NONE DETEC mg/dL 7 (H)    Abnormal Protein Band     NONE DETEC mg/dL 1 (H)    Interpretation           Iron     41 - 142 ug/dL  39 (L)   TIBC     236 - 444 ug/dL  248   Saturation Ratios     21 - 57 %  16 (L)   UIBC     120 - 384 ug/dL  208   Kappa free light chain     3.3 - 19.4 mg/L  412.0 (H)   Lamda free light chains     5.7 - 26.3 mg/L  8.0   Kappa, lamda light chain ratio     0.26 - 1.65  51.50 (H)   Sed Rate     0 - 22 mm/hr  110 (H)   LDH     98 - 192 U/L  135   Ferritin     11 - 307 ng/mL  154   Vitamin B12     180 - 914 pg/mL  709     09/02/2019 Bone Marrow Biopsy ([id number])   08/05/2019 Bone Marrow Biopsy ([id number])        RADIOGRAPHIC STUDIES: I have personally reviewed the radiological images as listed and agreed with the findings in the report. No results found.   ASSESSMENT & PLAN:  Jasmine Fletcher is a 88 y.o. female with:  #1 IgG Kappa Monoclonal paraproteinemia with M protein of 3.9 -highly concerning for MM  06/14/2019 SPEP revealed total protein at 10.1, alpha 2 at 1.0, gamma globulin at 4.1, Abnormal protein band1 at 3.9 UPEP +ve for Bence Jones protein  08/08/2019 PET scan with results revealing "Two right thyroid nodules are both highly  hypermetabolic. A significant minority of hypermetabolic thyroid lesions represent thyroid malignancy. Further workup with thyroid ultrasound is recommended. A small left upper para-aortic lymph node is mildly hypermetabolic with maximum SUV 4.7, significance uncertain. No significant abnormal accentuated osseous activity or compelling focal findings of active multiple myeloma in the skeleton. Hypodense lesions in the liver are photopenic and likely benign Hyperdense small lesions of the left kidney upper pole are too small to characterize but statistically most likely to be complex cysts. If the patient has hematuria or if otherwise clinically warranted, renal protocol MRI with and without contrast could be utilized for further characterization. Mild mid to distal accentuated thoracic esophageal  activity with maximum SUV of 4.8; mildly accentuated activity in the gastric cardia/gastroesophageal junction with maximum SUV 5.8. Both could be physiologic but rarely low-grade malignancy could have a similar appearance, upper endoscopy could be utilized for further workup if clinically warranted. Other imaging findings of potential clinical significance: Chronic ischemic microvascular white matter disease intracranially. Aortic Atherosclerosis (ICD10-I70.0). Mild cardiomegaly. Biapical pleuroparenchymal scarring and probable scarring in the superior segment left lower lobe. Calcified uterine fibroids. Thoracolumbar scoliosis. No clear bone lesions."  08/05/2019 bone marrow biopsy ([id number]) with results revealing "no monoclonal b cell population or abnormal T cell phenotype identified. Atypical lymphoplasmacytoid aggregates - not clear for myeloma  09/02/2019 bone marrow biopsy ([id number]) with results revealing small popoulation of monoclonal plasma cell. Does not appear to co-related with M protein levels and seem to be underrepresented.  #2. FDG avid thyroid nodules  #3.  Patient Active Problem List   Diagnosis Date Noted   Multiple myeloma not having achieved remission ([place]) 02/10/2022   Depression, major, single episode, complete remission ([place]) 02/13/2020   Smoldering myeloma 10/17/2019   Vitamin D deficiency 10/17/2019   Dementia without behavioral disturbance ([place]) 07/01/2017   Mixed hyperlipidemia 11/29/2013   Osteoporosis 03/15/2013   BRADYCARDIA 02/17/2009   HYPERTENSION, BENIGN ESSENTIAL 02/16/2009   ANEMIA, HYPOCHROMIC 07/26/2007   DISORDER, TOBACCO USE 07/26/2007   DEPRESSION 07/26/2007   CARDIAC CATHETERIZATION, LEFT, HX OF 07/26/2007    PLAN: -Discussed the most recent lab results in details. Lab showed CBC and CMP stable.  -Myeloma panel shows stable M spike of 3.3 g/dL which is unchanged from her last visit. Patient has no lab or clinical findings suggestive of  progression to active myeloma at this time. -No new kidney failure and hypercalcemia.   FOLLOW UP: Phone visit with Dr. Irene Limbo in 4 months Labs 1 week prior to phone visit  The total time spent in the appointment was 20 minutes* .  All of the patient's questions were answered with apparent satisfaction. The patient knows to call the clinic with any problems, questions or concerns.   I,Param Shah,acting as a Education administrator for Sullivan Lone, MD.,have documented all relevant documentation on the behalf of Sullivan Lone, MD,as directed by  Sullivan Lone, MD while in the presence of Sullivan Lone, MD.  Sullivan Lone MD [place] AAHIVMS [facility] Hematology/Oncology Physician [facility]  .*Total Encounter Time as defined by the Centers for Medicare and Medicaid Services includes, in addition to the face-to-face time of a patient visit (documented in the note above) non-face-to-face time: obtaining and reviewing outside history, ordering and reviewing medications, tests or procedures, care coordination (communications with other health care professionals or caregivers) and documentation in the medical record.

## 2022-11-03 ENCOUNTER — Encounter: Payer: Self-pay | Admitting: Nurse Practitioner

## 2022-11-03 ENCOUNTER — Ambulatory Visit (INDEPENDENT_AMBULATORY_CARE_PROVIDER_SITE_OTHER): Payer: Medicare Other | Admitting: Nurse Practitioner

## 2022-11-03 VITALS — BP 158/90 | HR 62 | Temp 96.8°F | Ht 65.5 in | Wt 128.0 lb

## 2022-11-03 DIAGNOSIS — G4700 Insomnia, unspecified: Secondary | ICD-10-CM | POA: Diagnosis not present

## 2022-11-03 DIAGNOSIS — I10 Essential (primary) hypertension: Secondary | ICD-10-CM | POA: Diagnosis not present

## 2022-11-03 DIAGNOSIS — R634 Abnormal weight loss: Secondary | ICD-10-CM

## 2022-11-03 DIAGNOSIS — E782 Mixed hyperlipidemia: Secondary | ICD-10-CM

## 2022-11-03 DIAGNOSIS — D472 Monoclonal gammopathy: Secondary | ICD-10-CM

## 2022-11-03 DIAGNOSIS — M810 Age-related osteoporosis without current pathological fracture: Secondary | ICD-10-CM

## 2022-11-03 DIAGNOSIS — Z23 Encounter for immunization: Secondary | ICD-10-CM | POA: Diagnosis not present

## 2022-11-03 DIAGNOSIS — E559 Vitamin D deficiency, unspecified: Secondary | ICD-10-CM | POA: Diagnosis not present

## 2022-11-03 DIAGNOSIS — F0390 Unspecified dementia without behavioral disturbance: Secondary | ICD-10-CM

## 2022-11-03 MED ORDER — LISINOPRIL 10 MG PO TABS: ORAL_TABLET | ORAL | 3 refills | Status: DC

## 2022-11-03 MED ORDER — SIMVASTATIN 20 MG PO TABS: 20.0000 mg | ORAL_TABLET | Freq: Every day | ORAL | 1 refills | Status: DC

## 2022-11-03 NOTE — Progress Notes (Signed)
Careteam: Patient Care Team: Lauree Chandler, NP as PCP - General (Geriatric Medicine)  PLACE OF SERVICE:  [facility] Directive information    No Known Allergies  Chief Complaint  Patient presents with   Hypertension    4 month follow up.  Care gaps include shingles vaccine, covid vaccine.      HPI: Patient is a 88 y.o. female for routine follow up.  Weight up from previously (had weight loss) - snacking more.  Doesn't check blood pressure at home. Did not take blood pressure medication yet today.  Does not eat a lot of salt- but eats food high in sodium.   Followed by oncologist for myeloma.   Sometimes does not sleep well due to sounds outside her room/house.   Lives alone but close contact with family every day.  Had headache in the mornings but goes away after she drinks her coffee.   Review of Systems:  Review of Systems  Constitutional:  Negative for chills, fever and weight loss.  HENT:  Negative for tinnitus.   Respiratory:  Negative for cough, sputum production and shortness of breath.   Cardiovascular:  Negative for chest pain, palpitations and leg swelling.  Gastrointestinal:  Negative for abdominal pain, constipation, diarrhea and heartburn.  Genitourinary:  Negative for dysuria, frequency and urgency.  Musculoskeletal:  Negative for back pain, falls, joint pain and myalgias.  Skin: Negative.   Neurological:  Negative for dizziness and headaches.  Psychiatric/Behavioral:  Negative for depression and memory loss. The patient does not have insomnia.     Past Medical History:  Diagnosis Date   History of skin cancer    Dr.John Hall   Hyperlipidemia    Hypertension    Osteoporosis    Past Surgical History:  Procedure Laterality Date   ABDOMINAL HYSTERECTOMY  1998   Social History:   reports that she quit smoking about 19 years ago. Her smoking use included cigarettes. She smoked an average of 1 pack per day. She has never used  smokeless tobacco. She reports that she does not drink alcohol and does not use drugs.  Family History  Problem Relation Age of Onset   Cancer Father    Dementia Sister    Breast cancer Maternal Grandmother    Breast cancer Paternal Grandmother     Medications: Patient's Medications  New Prescriptions   No medications on file  Previous Medications   ASPIRIN 81 MG TABLET    Take 81 mg by mouth daily.   DENOSUMAB (PROLIA) 60 MG/ML SOSY INJECTION    Inject 60 mg into the skin every 6 (six) months.   FEEDING SUPPLEMENT (BOOST HIGH PROTEIN) LIQD    Take 1 Container by mouth daily.   LISINOPRIL (ZESTRIL) 10 MG TABLET    TAKE 1 TABLET(10 MG) BY MOUTH DAILY   SIMVASTATIN (ZOCOR) 20 MG TABLET    TAKE 1 TABLET BY MOUTH EVERY DAY  Modified Medications   No medications on file  Discontinued Medications   No medications on file    Physical Exam:  Vitals:   11/03/22 0842 11/03/22 0902  BP: (!) 152/74 (!) 158/90  Pulse: 62   Temp: (!) 96.8 F (36 C)   TempSrc: Temporal   SpO2: 99%   Weight: 128 lb (58.1 kg)   Height: 5' 5.5" (1.664 m)    Body mass index is 20.98 kg/m. Wt Readings from Last 3 Encounters:  11/03/22 128 lb (58.1 kg)  06/27/22 124 lb (56.2 kg)  05/20/22 127 lb 1.6 oz (57.7 kg)    Physical Exam Constitutional:      General: She is not in acute distress.    Appearance: She is well-developed. She is not diaphoretic.  HENT:     Head: Normocephalic and atraumatic.     Mouth/Throat:     Pharynx: No oropharyngeal exudate.  Eyes:     Conjunctiva/sclera: Conjunctivae normal.     Pupils: Pupils are equal, round, and reactive to light.  Cardiovascular:     Rate and Rhythm: Normal rate and regular rhythm.     Heart sounds: Normal heart sounds.  Pulmonary:     Effort: Pulmonary effort is normal.     Breath sounds: Normal breath sounds.  Abdominal:     General: Bowel sounds are normal.     Palpations: Abdomen is soft.  Musculoskeletal:     Cervical back: Normal  range of motion and neck supple.     Right lower leg: No edema.     Left lower leg: No edema.  Skin:    General: Skin is warm and dry.  Neurological:     Mental Status: She is alert.  Psychiatric:        Mood and Affect: Mood normal.     Labs reviewed: Basic Metabolic Panel: Recent Labs    01/21/22 0844 05/13/22 0857 09/12/22 0911  NA 138 137 138  K 4.1 4.0 4.6  CL 104 105 107  CO2 '30 28 28  '$ GLUCOSE 75 83 85  BUN '23 19 23  '$ CREATININE 1.19* 0.96 0.91  CALCIUM 9.8 10.0 9.3   Liver Function Tests: Recent Labs    01/21/22 0844 05/13/22 0857 09/12/22 0911  AST 18 14* 14*  ALT '10 7 8  '$ ALKPHOS 44 36* 40  BILITOT 0.2* 0.2* 0.2*  PROT 9.8* 9.8* 10.3*  ALBUMIN 3.6 3.5 3.4*   No results for input(s): "LIPASE", "AMYLASE" in the last 8760 hours. No results for input(s): "AMMONIA" in the last 8760 hours. CBC: Recent Labs    01/21/22 0844 05/13/22 0857 09/12/22 0911  WBC 3.6* 3.4* 3.7*  NEUTROABS 1.5* 1.9 2.1  HGB 10.3* 10.2* 10.6*  HCT 33.2* 32.4* 33.4*  MCV 87.8 87.8 87.7  PLT 171 187 167   Lipid Panel: Recent Labs    06/27/22 0922  CHOL 134  HDL 45*  LDLCALC 73  TRIG 79  CHOLHDL 3.0   TSH: No results for input(s): "TSH" in the last 8760 hours. A1C: No results found for: "HGBA1C"   Assessment/Plan 1. Mixed hyperlipidemia Stable, continue medication as prescribed - simvastatin (ZOCOR) 20 MG tablet; Take 1 tablet (20 mg total) by mouth daily.  Dispense: 90 tablet; Refill: 1  2. HYPERTENSION, BENIGN ESSENTIAL -did not take bp medication today -encouraged to take prior to appt -will continue current regimen - lisinopril (ZESTRIL) 10 MG tablet; TAKE 1 TABLET(10 MG) BY MOUTH DAILY  Dispense: 90 tablet; Refill: 3  3. Need for influenza vaccination - Flu Vaccine QUAD High Dose(Fluad)  4. Osteoporosis, unspecified osteoporosis type, unspecified pathological fracture presence -Recommended to take calcium 600 mg twice daily with Vitamin D 2000 units  daily and weight bearing activity 30 mins/5 days a week -continues on prolia every 6 months  5. Vitamin D deficiency -continues supplement   6. Smoldering myeloma Continues to follow with oncology  7. Dementia without behavioral disturbance (HCC) -Stable, no acute changes in cognitive or functional status, continue supportive care.   8. Weight loss -stable has gained weight since last  visit. encouraged to liberalize diet. To have protein supplement in addition to smallest meal of the day.  9. Insomnia, unspecified type Stable at this time.    Return in about 3 months (around 02/03/2023) for routine follow up (blood pressure&prolia) . Carlos American. [place], [facility] [phone]

## 2022-11-03 NOTE — Patient Instructions (Signed)
Continue with 3 meals daily, boost and nutritious snacks.  Take blood pressure medication prior to next visit.

## 2022-11-25 NOTE — Progress Notes (Signed)
This encounter was created in error - please disregard.

## 2022-12-23 ENCOUNTER — Other Ambulatory Visit: Payer: Self-pay | Admitting: *Deleted

## 2022-12-23 MED ORDER — DENOSUMAB 60 MG/ML ~~LOC~~ SOSY: 60.0000 mg | PREFILLED_SYRINGE | SUBCUTANEOUS | 0 refills | Status: DC

## 2022-12-23 NOTE — Telephone Encounter (Signed)
Patient gets her Prolia through the Pharmacy.  Patient is due for injection in February.  Rx sent to pharmacy.

## 2023-01-20 ENCOUNTER — Other Ambulatory Visit: Payer: Self-pay

## 2023-01-20 DIAGNOSIS — D472 Monoclonal gammopathy: Secondary | ICD-10-CM

## 2023-01-21 ENCOUNTER — Inpatient Hospital Stay: Payer: 59 | Attending: Hematology

## 2023-01-21 ENCOUNTER — Other Ambulatory Visit: Payer: Self-pay

## 2023-01-21 DIAGNOSIS — Z85828 Personal history of other malignant neoplasm of skin: Secondary | ICD-10-CM | POA: Insufficient documentation

## 2023-01-21 DIAGNOSIS — Z87891 Personal history of nicotine dependence: Secondary | ICD-10-CM | POA: Insufficient documentation

## 2023-01-21 DIAGNOSIS — D472 Monoclonal gammopathy: Secondary | ICD-10-CM | POA: Diagnosis not present

## 2023-01-21 DIAGNOSIS — M810 Age-related osteoporosis without current pathological fracture: Secondary | ICD-10-CM | POA: Diagnosis not present

## 2023-01-21 LAB — CBC WITH DIFFERENTIAL (CANCER CENTER ONLY)
Abs Immature Granulocytes: 0.01 10*3/uL (ref 0.00–0.07)
Basophils Absolute: 0.1 10*3/uL (ref 0.0–0.1)
Basophils Relative: 1 %
Eosinophils Absolute: 0 10*3/uL (ref 0.0–0.5)
Eosinophils Relative: 1 %
HCT: 34.5 % — ABNORMAL LOW (ref 36.0–46.0)
Hemoglobin: 11 g/dL — ABNORMAL LOW (ref 12.0–15.0)
Immature Granulocytes: 0 %
Lymphocytes Relative: 36 %
Lymphs Abs: 1.4 10*3/uL (ref 0.7–4.0)
MCH: 27.4 pg (ref 26.0–34.0)
MCHC: 31.9 g/dL (ref 30.0–36.0)
MCV: 86 fL (ref 80.0–100.0)
Monocytes Absolute: 0.6 10*3/uL (ref 0.1–1.0)
Monocytes Relative: 15 %
Neutro Abs: 1.8 10*3/uL (ref 1.7–7.7)
Neutrophils Relative %: 47 %
Platelet Count: 211 10*3/uL (ref 150–400)
RBC: 4.01 MIL/uL (ref 3.87–5.11)
RDW: 13.3 % (ref 11.5–15.5)
WBC Count: 3.8 10*3/uL — ABNORMAL LOW (ref 4.0–10.5)
nRBC: 0 % (ref 0.0–0.2)

## 2023-01-21 LAB — CMP (CANCER CENTER ONLY)
ALT: 7 U/L (ref 0–44)
AST: 16 U/L (ref 15–41)
Albumin: 3.5 g/dL (ref 3.5–5.0)
Alkaline Phosphatase: 48 U/L (ref 38–126)
Anion gap: 4 — ABNORMAL LOW (ref 5–15)
BUN: 22 mg/dL (ref 8–23)
CO2: 29 mmol/L (ref 22–32)
Calcium: 10.8 mg/dL — ABNORMAL HIGH (ref 8.9–10.3)
Chloride: 105 mmol/L (ref 98–111)
Creatinine: 0.96 mg/dL (ref 0.44–1.00)
GFR, Estimated: 57 mL/min — ABNORMAL LOW (ref >60)
Glucose, Bld: 82 mg/dL (ref 70–99)
Potassium: 4 mmol/L (ref 3.5–5.1)
Sodium: 138 mmol/L (ref 135–145)
Total Bilirubin: 0.3 mg/dL (ref 0.3–1.2)
Total Protein: 10.1 g/dL — ABNORMAL HIGH (ref 6.5–8.1)

## 2023-01-27 ENCOUNTER — Ambulatory Visit: Payer: Medicare Other | Admitting: Nurse Practitioner

## 2023-01-28 ENCOUNTER — Inpatient Hospital Stay (HOSPITAL_BASED_OUTPATIENT_CLINIC_OR_DEPARTMENT_OTHER): Payer: 59 | Admitting: Hematology

## 2023-01-28 DIAGNOSIS — D472 Monoclonal gammopathy: Secondary | ICD-10-CM

## 2023-01-28 LAB — MULTIPLE MYELOMA PANEL, SERUM
Albumin SerPl Elph-Mcnc: 3.8 g/dL (ref 2.9–4.4)
Albumin/Glob SerPl: 0.7 (ref 0.7–1.7)
Alpha 1: 0.3 g/dL (ref 0.0–0.4)
Alpha2 Glob SerPl Elph-Mcnc: 0.7 g/dL (ref 0.4–1.0)
B-Globulin SerPl Elph-Mcnc: 0.9 g/dL (ref 0.7–1.3)
Gamma Glob SerPl Elph-Mcnc: 4 g/dL — ABNORMAL HIGH (ref 0.4–1.8)
Globulin, Total: 5.9 g/dL — ABNORMAL HIGH (ref 2.2–3.9)
IgA: 64 mg/dL (ref 64–422)
IgG (Immunoglobin G), Serum: 5093 mg/dL — ABNORMAL HIGH (ref 586–1602)
IgM (Immunoglobulin M), Srm: 13 mg/dL — ABNORMAL LOW (ref 26–217)
M Protein SerPl Elph-Mcnc: 3.7 g/dL — ABNORMAL HIGH
Total Protein ELP: 9.7 g/dL — ABNORMAL HIGH (ref 6.0–8.5)

## 2023-01-28 NOTE — Progress Notes (Signed)
HEMATOLOGY/ONCOLOGY PHONE VISIT NOTE  Date of Service: 01/28/23     Patient Care Team: Lauree Chandler, NP as PCP - General (Geriatric Medicine)   CHIEF COMPLAINTS:  Follow-up for continued evaluation and management of smoldering myeloma   HISTORY OF PRESENTING ILLNESS:   Jasmine Fletcher is a wonderful 89 y.o. female who has been referred to [place] by Lauree Chandler, NP for evaluation and management of elevated serum globulin levels. The pt reports that she is doing well overall.  The pt reports that her appetite has decreased recently but that she eats a healthy diet. She notes mild fatigue. Denies new bone pain, back pain, hip pain, infection concerns, and any other new concerns.  She has been getting a Prolia shot every 6 months for the past 2 years. She has also been taking Aricept but notes that her memory is worsening. Her daughter is her health care POA, but the pt makes her own healthcare decisions at this time.  Most recent lab results (06/14/2019) of CBC is as follows: all values are WNL except for HGB at 11.4, MCH at 26.8. 06/21/2019 UPEP revealed protein/creat ratio at 209, total protein at 29, and two abnormal protein bands 06/14/2019 SPEP revealed total protein at 10.1, Alpha 2 at 1.0, gamma globulin at 4.1, abnormal protein band1 at 3.9    INTERVAL HISTORY:  .Marland KitchenI connected with Jasmine Fletcher on 01/28/2023 at  8:40 AM EST by telephone visit and verified that I am speaking with the correct person using two identifiers.   I had a phon visit with the patient on 09/22/2022 and she complained of mild fatigue, but was doing well overall.   Patient reported she was doing well overall. She denies fever, chills, night sweats, back pain, chest pain, or leg swelling. However, she does complain of right knee pain.   Labs from 01/21/2023 were discussed in detail with the patient.   I discussed the limitations, risks, security and privacy concerns of performing an  evaluation and management service by telemedicine and the availability of in-person appointments. I also discussed with the patient that there may be a patient responsible charge related to this service. The patient expressed understanding and agreed to proceed.   Other persons participating in the visit and their role in the encounter: patients daughter   Patient's location: home  Provider's location: [facility]   Chief Complaint: f/u for smoldering myeloma     MEDICAL HISTORY:  Past Medical History:  Diagnosis Date   History of skin cancer    Dr.John Hall   Hyperlipidemia    Hypertension    Osteoporosis     SURGICAL HISTORY: Past Surgical History:  Procedure Laterality Date   ABDOMINAL HYSTERECTOMY  1998    SOCIAL HISTORY: Social History   Socioeconomic History   Marital status: Single    Spouse name: Not on file   Number of children: Not on file   Years of education: Not on file   Highest education level: Not on file  Occupational History   Not on file  Tobacco Use   Smoking status: Former    Packs/day: 1.00    Types: Cigarettes    Quit date: 12/08/2002    Years since quitting: 20.1   Smokeless tobacco: Never   Tobacco comments:    Quit t age 32   Vaping Use   Vaping Use: Never used  Substance and Sexual Activity   Alcohol use: No    Alcohol/week: 0.0 standard drinks  of alcohol   Drug use: No   Sexual activity: Not Currently  Other Topics Concern   Not on file  Social History Narrative   ** Merged History Encounter **       Social Determinants of Health   Financial Resource Strain: Low Risk  (06/04/2018)   Overall Financial Resource Strain (CARDIA)    Difficulty of Paying Living Expenses: Not hard at all  Food Insecurity: No Food Insecurity (06/04/2018)   Hunger Vital Sign    Worried About Running Out of Food in the Last Year: Never true    Ran Out of Food in the Last Year: Never true  Transportation Needs: No Transportation Needs (06/04/2018)   PRAPARE -  Hydrologist (Medical): No    Lack of Transportation (Non-Medical): No  Physical Activity: Insufficiently Active (06/04/2018)   Exercise Vital Sign    Days of Exercise per Week: 3 days    Minutes of Exercise per Session: 30 min  Stress: No Stress Concern Present (06/04/2018)   [place]    Feeling of Stress : Not at all  Social Connections: Moderately Isolated (06/04/2018)   Social Connection and Isolation Panel [NHANES]    Frequency of Communication with Friends and Family: Three times a week    Frequency of Social Gatherings with Friends and Family: Three times a week    Attends Religious Services: Never    Active Member of Clubs or Organizations: No    Attends Archivist Meetings: Never    Marital Status: Never married  Intimate Partner Violence: Not At Risk (06/04/2018)   Humiliation, Afraid, Rape, and Kick questionnaire    Fear of Current or Ex-Partner: No    Emotionally Abused: No    Physically Abused: No    Sexually Abused: No    FAMILY HISTORY: Family History  Problem Relation Age of Onset   Cancer Father    Dementia Sister    Breast cancer Maternal Grandmother    Breast cancer Paternal Grandmother     ALLERGIES:  has No Known Allergies.  MEDICATIONS:  Current Outpatient Medications  Medication Sig Dispense Refill   aspirin 81 MG tablet Take 81 mg by mouth daily.     denosumab (PROLIA) 60 MG/ML SOSY injection Inject 60 mg into the skin every 6 (six) months. 60 mL 0   feeding supplement (BOOST HIGH PROTEIN) LIQD Take 1 Container by mouth daily.     lisinopril (ZESTRIL) 10 MG tablet TAKE 1 TABLET(10 MG) BY MOUTH DAILY 90 tablet 3   simvastatin (ZOCOR) 20 MG tablet Take 1 tablet (20 mg total) by mouth daily. 90 tablet 1   No current facility-administered medications for this visit.    REVIEW OF SYSTEMS:   10 Point review of Systems was done is negative except as  noted above.  PHYSICAL EXAMINATION: Telemedicine visit   LABORATORY DATA:  I have reviewed the data as listed  .    Latest Ref Rng & Units 01/21/2023    8:56 AM 09/12/2022    9:11 AM 05/13/2022    8:57 AM  CBC  WBC 4.0 - 10.5 K/uL 3.8  3.7  3.4   Hemoglobin 12.0 - 15.0 g/dL 11.0  10.6  10.2   Hematocrit 36.0 - 46.0 % 34.5  33.4  32.4   Platelets 150 - 400 K/uL 211  167  187     .    Latest Ref Rng & Units  01/21/2023    8:56 AM 09/12/2022    9:11 AM 05/13/2022    8:57 AM  CMP  Glucose 70 - 99 mg/dL 82  85  83   BUN 8 - 23 mg/dL '22  23  19   '$ Creatinine 0.44 - 1.00 mg/dL 0.96  0.91  0.96   Sodium 135 - 145 mmol/L 138  138  137   Potassium 3.5 - 5.1 mmol/L 4.0  4.6  4.0   Chloride 98 - 111 mmol/L 105  107  105   CO2 22 - 32 mmol/L '29  28  28   '$ Calcium 8.9 - 10.3 mg/dL 10.8  9.3  10.0   Total Protein 6.5 - 8.1 g/dL 10.1  10.3  9.8   Total Bilirubin 0.3 - 1.2 mg/dL 0.3  0.2  0.2   Alkaline Phos 38 - 126 U/L 48  40  36   AST 15 - 41 U/L '16  14  14   '$ ALT 0 - 44 U/L '7  8  7      '$ Component     Latest Ref Rng & Units 06/21/2019 07/28/2019 08/01/2019  Total Protein, Urine-UPE24     Not Estab. mg/dL   8.2  Total Protein, Urine-Ur/day     30 - 150 mg/24 hr   180 (H)  ALBUMIN, U     %   19.7  ALPHA 1 URINE     %   1.5  Alpha 2, Urine     %   6.0  % BETA, Urine     %   21.4  GAMMA GLOBULIN URINE     %   51.4  Free Kappa Lt Chains,Ur     0.63 - 113.79 mg/L   383.74 (H)  Free Lambda Lt Chains,Ur     0.47 - 11.77 mg/L   0.68  Free Kappa/Lambda Ratio     1.03 - 31.76   564.32 (H)  Immunofixation Result, Urine        Comment  Total Volume        2,200  M-SPIKE %, Urine     Not Observed %   28.3 (H)  M-Spike, mg/24 hr     Not Observed mg/24 hr   51 (H)  NOTE:        Comment  IgG (Immunoglobin G), Serum     586 - 1,602 mg/dL  5,351 (H)   IgA     64 - 422 mg/dL  99   IgM (Immunoglobulin M), Srm     26 - 217 mg/dL  15 (L)   Total Protein ELP     6.0 - 8.5 g/dL  10.2  (H)   Albumin SerPl Elph-Mcnc     2.9 - 4.4 g/dL  4.1   Alpha 1     0.0 - 0.4 g/dL  0.3   Alpha2 Glob SerPl Elph-Mcnc     0.4 - 1.0 g/dL  0.8   B-Globulin SerPl Elph-Mcnc     0.7 - 1.3 g/dL  1.1   Gamma Glob SerPl Elph-Mcnc     0.4 - 1.8 g/dL  3.9 (H)   M Protein SerPl Elph-Mcnc     Not Observed g/dL  3.4 (H)   Globulin, Total     2.2 - 3.9 g/dL  6.1 (H)   Albumin/Glob SerPl     0.7 - 1.7  0.7   IFE 1       Comment   Please Note (HCV):  Comment   Creatinine, Urine     20 - 275 mg/dL 139    Protein/Creat Ratio     21 - 161 mg/g creat 209 (H)    Protein/Creatinine Ratio     0.021 - 0.16 mg/mg creat 0.209 (H)    Total Protein, Urine     5 - 24 mg/dL 29 (H)    Albumin     % 44    Alpha-1-Globulin, U     % 3    ALPHA-2-GLOBULIN, U     % 12    Beta Globulin, U     % 9    Gamma Globulin, U     % 33    Abnormal Protein Band     NONE DETEC mg/dL 7 (H)    Abnormal Protein Band     NONE DETEC mg/dL 1 (H)    Interpretation          Iron     41 - 142 ug/dL  39 (L)   TIBC     236 - 444 ug/dL  248   Saturation Ratios     21 - 57 %  16 (L)   UIBC     120 - 384 ug/dL  208   Kappa free light chain     3.3 - 19.4 mg/L  412.0 (H)   Lamda free light chains     5.7 - 26.3 mg/L  8.0   Kappa, lamda light chain ratio     0.26 - 1.65  51.50 (H)   Sed Rate     0 - 22 mm/hr  110 (H)   LDH     98 - 192 U/L  135   Ferritin     11 - 307 ng/mL  154   Vitamin B12     180 - 914 pg/mL  709     09/02/2019 Bone Marrow Biopsy ([id number])   08/05/2019 Bone Marrow Biopsy ([id number])        RADIOGRAPHIC STUDIES: I have personally reviewed the radiological images as listed and agreed with the findings in the report. No results found.   ASSESSMENT & PLAN:  KEIMYA KILBURG is a 89 y.o. female with:  #1 IgG Kappa Monoclonal paraproteinemia with M protein of 3.9 -highly concerning for MM  06/14/2019 SPEP revealed total protein at 10.1, alpha 2 at 1.0, gamma  globulin at 4.1, Abnormal protein band1 at 3.9 UPEP +ve for Bence Jones protein  08/08/2019 PET scan with results revealing "Two right thyroid nodules are both highly hypermetabolic. A significant minority of hypermetabolic thyroid lesions represent thyroid malignancy. Further workup with thyroid ultrasound is recommended. A small left upper para-aortic lymph node is mildly hypermetabolic with maximum SUV 4.7, significance uncertain. No significant abnormal accentuated osseous activity or compelling focal findings of active multiple myeloma in the skeleton. Hypodense lesions in the liver are photopenic and likely benign Hyperdense small lesions of the left kidney upper pole are too small to characterize but statistically most likely to be complex cysts. If the patient has hematuria or if otherwise clinically warranted, renal protocol MRI with and without contrast could be utilized for further characterization. Mild mid to distal accentuated thoracic esophageal activity with maximum SUV of 4.8; mildly accentuated activity in the gastric cardia/gastroesophageal junction with maximum SUV 5.8. Both could be physiologic but rarely low-grade malignancy could have a similar appearance, upper endoscopy could be utilized for further workup if clinically warranted. Other imaging findings of potential clinical significance: Chronic ischemic  microvascular white matter disease intracranially. Aortic Atherosclerosis (ICD10-I70.0). Mild cardiomegaly. Biapical pleuroparenchymal scarring and probable scarring in the superior segment left lower lobe. Calcified uterine fibroids. Thoracolumbar scoliosis. No clear bone lesions."  08/05/2019 bone marrow biopsy ([id number]) with results revealing "no monoclonal b cell population or abnormal T cell phenotype identified. Atypical lymphoplasmacytoid aggregates - not clear for myeloma  09/02/2019 bone marrow biopsy ([id number]) with results revealing small popoulation of monoclonal  plasma cell. Does not appear to co-related with M protein levels and seem to be underrepresented.  #2. FDG avid thyroid nodules  #3.  Patient Active Problem List   Diagnosis Date Noted   Multiple myeloma not having achieved remission ([place]) 02/10/2022   Depression, major, single episode, complete remission ([place]) 02/13/2020   Smoldering myeloma 10/17/2019   Vitamin D deficiency 10/17/2019   Dementia without behavioral disturbance ([place]) 07/01/2017   Mixed hyperlipidemia 11/29/2013   Osteoporosis 03/15/2013   BRADYCARDIA 02/17/2009   HYPERTENSION, BENIGN ESSENTIAL 02/16/2009   ANEMIA, HYPOCHROMIC 07/26/2007   DISORDER, TOBACCO USE 07/26/2007   DEPRESSION 07/26/2007   CARDIAC CATHETERIZATION, LEFT, HX OF 07/26/2007    PLAN: -Discussed lab results from 01/21/2023 with the patient. CBC shows slightly decreased hemoglobin of 11.0 and hematocrit of 34.5.  CMP shows hypercalcemia with calcium levels of 10.8.  Myeloma panel shows M spike of 3.7g/dl -Bone survey before our next visit in 6 months.   -Recommended drink 2L of water.   FOLLOW UP: RTC with Dr Irene Limbo with labs in 2 months Whole body Bone survey in 6 weeks  The total time spent in the appointment was 20 minutes* .  All of the patient's questions were answered with apparent satisfaction. The patient knows to call the clinic with any problems, questions or concerns.   Sullivan Lone MD MS AAHIVMS [facility] Hematology/Oncology Physician [facility]  .*Total Encounter Time as defined by the Centers for Medicare and Medicaid Services includes, in addition to the face-to-face time of a patient visit (documented in the note above) non-face-to-face time: obtaining and reviewing outside history, ordering and reviewing medications, tests or procedures, care coordination (communications with other health care professionals or caregivers) and documentation in the medical record.   I, Cleda Mccreedy, am acting as a Education administrator for Sullivan Lone, MD. .I have reviewed the above documentation for accuracy and completeness, and I agree with the above. Brunetta Genera MD

## 2023-01-29 ENCOUNTER — Telehealth: Payer: Self-pay | Admitting: Hematology

## 2023-01-29 NOTE — Telephone Encounter (Signed)
Called patient per 2/21 los notes to schedule f/u. Spoke with patient's daughter. Patient scheduled and will be notified.

## 2023-01-30 ENCOUNTER — Encounter: Payer: Self-pay | Admitting: Nurse Practitioner

## 2023-01-30 ENCOUNTER — Ambulatory Visit (INDEPENDENT_AMBULATORY_CARE_PROVIDER_SITE_OTHER): Payer: 59 | Admitting: Nurse Practitioner

## 2023-01-30 VITALS — BP 136/74 | HR 71 | Temp 97.4°F | Ht 65.5 in | Wt 122.6 lb

## 2023-01-30 DIAGNOSIS — D649 Anemia, unspecified: Secondary | ICD-10-CM

## 2023-01-30 DIAGNOSIS — R634 Abnormal weight loss: Secondary | ICD-10-CM

## 2023-01-30 DIAGNOSIS — D472 Monoclonal gammopathy: Secondary | ICD-10-CM

## 2023-01-30 DIAGNOSIS — E782 Mixed hyperlipidemia: Secondary | ICD-10-CM

## 2023-01-30 DIAGNOSIS — M810 Age-related osteoporosis without current pathological fracture: Secondary | ICD-10-CM | POA: Diagnosis not present

## 2023-01-30 DIAGNOSIS — F0390 Unspecified dementia without behavioral disturbance: Secondary | ICD-10-CM

## 2023-01-30 MED ORDER — DENOSUMAB 60 MG/ML ~~LOC~~ SOSY
60.0000 mg | PREFILLED_SYRINGE | Freq: Once | SUBCUTANEOUS | Status: AC
  Administered 2023-01-30: 60 mg via SUBCUTANEOUS

## 2023-01-30 NOTE — Progress Notes (Signed)
Careteam: Patient Care Team: Lauree Chandler, NP as PCP - General (Geriatric Medicine)  PLACE OF SERVICE:  [place] Directive information    No Known Allergies  Chief Complaint  Patient presents with   Medical Management of Chronic Issues    Patient presents today for a 3 month follow-up   Quality Metric Gaps    Zoster, TDAP, COVID#4     HPI: Patient is a 89 y.o. female here for routine follow up and prolia injection.  Here with her daughter- no acute concerns today  Weight is down about 6 lbs. She isn't able to cook much anymore. Occasionally gets fast food but she doesn't like to eat out. She really misses her own cooking. Drinking one boost a day.   She used to go to an adult day care center where she used to go on little field trips but she doesn't do that anymore. So she is typically at home by herself for most of the day. [facility]'t like the [facility].   Feels that memory is doing well.   Review of Systems:  Review of Systems  Constitutional:  Negative for chills, fever, malaise/fatigue and weight loss.  HENT:  Negative for congestion and sore throat.   Eyes:  Negative for blurred vision.  Respiratory:  Negative for cough, shortness of breath and wheezing.   Cardiovascular:  Negative for chest pain, palpitations and leg swelling.  Gastrointestinal:  Negative for abdominal pain, blood in stool, constipation, diarrhea, heartburn, nausea and vomiting.  Genitourinary:  Negative for dysuria, frequency, hematuria and urgency.  Musculoskeletal:  Negative for falls and joint pain.  Skin:  Negative for rash.  Neurological:  Negative for dizziness, tingling and headaches.  Endo/Heme/Allergies:  Negative for polydipsia.  Psychiatric/Behavioral:  Negative for depression. The patient is not nervous/anxious.     Past Medical History:  Diagnosis Date   History of skin cancer    Dr.John Hall   Hyperlipidemia    Hypertension    Osteoporosis    Past Surgical  History:  Procedure Laterality Date   ABDOMINAL HYSTERECTOMY  1998   Social History:   reports that she quit smoking about 20 years ago. Her smoking use included cigarettes. She smoked an average of 1 pack per day. She has never used smokeless tobacco. She reports that she does not drink alcohol and does not use drugs.  Family History  Problem Relation Age of Onset   Cancer Father    Dementia Sister    Breast cancer Maternal Grandmother    Breast cancer Paternal Grandmother     Medications: Patient's Medications  New Prescriptions   No medications on file  Previous Medications   ASPIRIN 81 MG TABLET    Take 81 mg by mouth daily.   DENOSUMAB (PROLIA) 60 MG/ML SOSY INJECTION    Inject 60 mg into the skin every 6 (six) months.   FEEDING SUPPLEMENT (BOOST HIGH PROTEIN) LIQD    Take 1 Container by mouth daily.   LISINOPRIL (ZESTRIL) 10 MG TABLET    TAKE 1 TABLET(10 MG) BY MOUTH DAILY   SIMVASTATIN (ZOCOR) 20 MG TABLET    Take 1 tablet (20 mg total) by mouth daily.  Modified Medications   No medications on file  Discontinued Medications   No medications on file    Physical Exam:  Vitals:   01/30/23 0856  BP: 136/74  Pulse: 71  Temp: (!) 97.4 F (36.3 C)  SpO2: 96%  Weight: 122 lb 9.6 oz (  55.6 kg)  Height: 5' 5.5" (1.664 m)   Body mass index is 20.09 kg/m. Wt Readings from Last 3 Encounters:  01/30/23 122 lb 9.6 oz (55.6 kg)  11/03/22 128 lb (58.1 kg)  06/27/22 124 lb (56.2 kg)    Physical Exam Constitutional:      General: She is not in acute distress.    Appearance: Normal appearance.  Cardiovascular:     Rate and Rhythm: Normal rate and regular rhythm.  Pulmonary:     Effort: No respiratory distress.     Breath sounds: Normal breath sounds.  Abdominal:     General: Bowel sounds are normal. There is no distension.     Palpations: Abdomen is soft. There is no mass.     Tenderness: There is no abdominal tenderness. There is no guarding.  Musculoskeletal:      Cervical back: Neck supple.  Lymphadenopathy:     Cervical: No cervical adenopathy.  Skin:    General: Skin is warm and dry.  Neurological:     Mental Status: She is alert and oriented to person, place, and time.  Psychiatric:        Mood and Affect: Mood normal.     Labs reviewed: Basic Metabolic Panel: Recent Labs    05/13/22 0857 09/12/22 0911 01/21/23 0856  NA 137 138 138  K 4.0 4.6 4.0  CL 105 107 105  CO2 '28 28 29  '$ GLUCOSE 83 85 82  BUN '19 23 22  '$ CREATININE 0.96 0.91 0.96  CALCIUM 10.0 9.3 10.8*   Liver Function Tests: Recent Labs    05/13/22 0857 09/12/22 0911 01/21/23 0856  AST 14* 14* 16  ALT '7 8 7  '$ ALKPHOS 36* 40 48  BILITOT 0.2* 0.2* 0.3  PROT 9.8* 10.3* 10.1*  ALBUMIN 3.5 3.4* 3.5   No results for input(s): "LIPASE", "AMYLASE" in the last 8760 hours. No results for input(s): "AMMONIA" in the last 8760 hours. CBC: Recent Labs    05/13/22 0857 09/12/22 0911 01/21/23 0856  WBC 3.4* 3.7* 3.8*  NEUTROABS 1.9 2.1 1.8  HGB 10.2* 10.6* 11.0*  HCT 32.4* 33.4* 34.5*  MCV 87.8 87.7 86.0  PLT 187 167 211   Lipid Panel: Recent Labs    06/27/22 0922  CHOL 134  HDL 45*  LDLCALC 73  TRIG 79  CHOLHDL 3.0   TSH: No results for input(s): "TSH" in the last 8760 hours. A1C: No results found for: "HGBA1C"   Assessment/Plan 1. Osteoporosis, unspecified osteoporosis type, unspecified pathological fracture presence Last DEXA scan Feb 2023. Continue prolia injections. Ca and Vit D levels were elevated, holding supplements for now. - denosumab (PROLIA) injection 60 mg  2. Mixed hyperlipidemia -continues on zocor  3. Weight loss -encouraged to liberalize diet. To have protein supplement in addition to smallest meal of the day. -to make sure to eat 3 meals a day  4. Dementia without behavioral disturbance (HCC) -Stable, no acute changes in cognitive or functional status, continue supportive care.   5. Smoldering myeloma -continues to follow up  with oncology/hematology for routine follow up  6. Anemia, unspecified type Stable on last labs.     Return in about 4 months (around 05/31/2023) for routine follow up.  Student- Archer Asa O'Berry ACPCNP-S  I personally was present during the history, physical exam and medical decision-making activities of this service and have verified that the service and findings are accurately documented in the student's note Kory Panjwani K. [place], [facility] [phone]

## 2023-03-10 ENCOUNTER — Ambulatory Visit (HOSPITAL_COMMUNITY)
Admission: RE | Admit: 2023-03-10 | Discharge: 2023-03-10 | Disposition: A | Discharge status: Home / Self Care | Payer: 59 | Source: Ambulatory Visit | Attending: Hematology | Admitting: Hematology

## 2023-03-10 DIAGNOSIS — C9000 Multiple myeloma not having achieved remission: Secondary | ICD-10-CM | POA: Diagnosis not present

## 2023-03-10 DIAGNOSIS — D472 Monoclonal gammopathy: Secondary | ICD-10-CM | POA: Diagnosis not present

## 2023-03-18 ENCOUNTER — Telehealth: Payer: Self-pay

## 2023-03-18 NOTE — Telephone Encounter (Signed)
Patients daughter [name] called stating she has a personal care services form for Jasmine Seller, NP to complete. Barbara plans to drop form off and would like a phone call when for is ready for pick-up.   [name] is aware that we will be on the lookout for form tomorrow.  [name] please advise if patient will need an appointment in order for you complete form

## 2023-03-18 NOTE — Telephone Encounter (Signed)
Should not need an appt, will look at the form when I see it and if we need appt will notify

## 2023-03-19 NOTE — Telephone Encounter (Signed)
Patient's daughter dropped PCS form off form started and placed on Abbey Chatters, NP's desk to complete and sign.

## 2023-03-20 NOTE — Telephone Encounter (Signed)
[name] completed form and signed.  Called Melonie Wenzler, daughter, [phone]  as note on form requested for pick up.  Daughter will pick up on Monday.  Copy sent to scanning and form left up front in drawer for pick up.

## 2023-04-01 ENCOUNTER — Other Ambulatory Visit: Payer: Self-pay

## 2023-04-01 DIAGNOSIS — D472 Monoclonal gammopathy: Secondary | ICD-10-CM

## 2023-04-02 NOTE — Progress Notes (Signed)
HEMATOLOGY/ONCOLOGY CLINIC VISIT NOTE  Date of Service: 04/03/2023   Patient Care Team: [name], NP as PCP - General (Geriatric Medicine)   CHIEF COMPLAINTS:  Follow-up for continued evaluation and management of smoldering myeloma   HISTORY OF PRESENTING ILLNESS:   Jasmine Fletcher is a wonderful 89 y.o. female who has been referred to [place] by [name], NP for evaluation and management of elevated serum globulin levels. The pt reports that she is doing well overall.  The pt reports that her appetite has decreased recently but that she eats a healthy diet. She notes mild fatigue. Denies new bone pain, back pain, hip pain, infection concerns, and any other new concerns.  She has been getting a Prolia shot every 6 months for the past 2 years. She has also been taking Aricept but notes that her memory is worsening. Her daughter is her health care POA, but the pt makes her own healthcare decisions at this time.  Most recent lab results (06/14/2019) of CBC is as follows: all values are WNL except for HGB at 11.4, MCH at 26.8. 06/21/2019 UPEP revealed protein/creat ratio at 209, total protein at 29, and two abnormal protein bands 06/14/2019 SPEP revealed total protein at 10.1, Alpha 2 at 1.0, gamma globulin at 4.1, abnormal protein band1 at 3.9   INTERVAL HISTORY:  Jasmine Fletcher is a 89 y.o. female, here for continued evaluation and management of smoldering myeloma. Patient was last connected with me on 01/28/2023 via telehealth. She complained of right knee pain, but was otherwise doing well overall.  Today, she is accompanied by her daughter. She reports some stable mild fatigue which she attributes to sleep disturbances. She does take naps during the day.  She regularly consumes home-cooked meals and reports that she has lost some weight. Her daughter expresses concerns that her diet is not too variable and her appetite has decreased. Patient has lost two pounds  since her last clinic visit and currently weighs 120 pounds. Patient finds eating alone unenjoyable. Her daughter reports a plan to receive at-home care and accompaniment. She notes that she has increased her water intake. She does not take any vitamin supplements on regular basis, but does take Boost daily.   Her daughter reports that patient's BP was slightly elevated. Her BP reading in clinic today is 174/66. Patient denies any changes in bowel habits, back pain, abdominal pain, or leg swelling. She does complain of bilateral knee pain, especially in the right knee, but notes that it is not too bothersome.  MEDICAL HISTORY:  Past Medical History:  Diagnosis Date   History of skin cancer    Dr.John Hall   Hyperlipidemia    Hypertension    Osteoporosis     SURGICAL HISTORY: Past Surgical History:  Procedure Laterality Date   ABDOMINAL HYSTERECTOMY  1998    SOCIAL HISTORY: Social History   Socioeconomic History   Marital status: Single    Spouse name: Not on file   Number of children: Not on file   Years of education: Not on file   Highest education level: Not on file  Occupational History   Not on file  Tobacco Use   Smoking status: Former    Packs/day: 1.00    Types: Cigarettes    Quit date: 12/08/2002    Years since quitting: 20.1   Smokeless tobacco: Never   Tobacco comments:    Quit t age 59   Vaping Use   Vaping Use:  Never used  Substance and Sexual Activity   Alcohol use: No    Alcohol/week: 0.0 standard drinks of alcohol   Drug use: No   Sexual activity: Not Currently  Other Topics Concern   Not on file  Social History Narrative   ** Merged History Encounter **       Social Determinants of Health   Financial Resource Strain: Low Risk  (06/04/2018)   Overall Financial Resource Strain (CARDIA)    Difficulty of Paying Living Expenses: Not hard at all  Food Insecurity: No Food Insecurity (06/04/2018)   Hunger Vital Sign    Worried About Running Out of Food  in the Last Year: Never true    Ran Out of Food in the Last Year: Never true  Transportation Needs: No Transportation Needs (06/04/2018)   PRAPARE - Administrator, Civil Service (Medical): No    Lack of Transportation (Non-Medical): No  Physical Activity: Insufficiently Active (06/04/2018)   Exercise Vital Sign    Days of Exercise per Week: 3 days    Minutes of Exercise per Session: 30 min  Stress: No Stress Concern Present (06/04/2018)   [employer] of Occupational Health - Occupational Stress Questionnaire    Feeling of Stress : Not at all  Social Connections: Moderately Isolated (06/04/2018)   Social Connection and Isolation Panel [NHANES]    Frequency of Communication with Friends and Family: Three times a week    Frequency of Social Gatherings with Friends and Family: Three times a week    Attends Religious Services: Never    Active Member of Clubs or Organizations: No    Attends Banker Meetings: Never    Marital Status: Never married  Intimate Partner Violence: Not At Risk (06/04/2018)   Humiliation, Afraid, Rape, and Kick questionnaire    Fear of Current or Ex-Partner: No    Emotionally Abused: No    Physically Abused: No    Sexually Abused: No    FAMILY HISTORY: Family History  Problem Relation Age of Onset   Cancer Father    Dementia Sister    Breast cancer Maternal Grandmother    Breast cancer Paternal Grandmother     ALLERGIES:  has No Known Allergies.  MEDICATIONS:  Current Outpatient Medications  Medication Sig Dispense Refill   aspirin 81 MG tablet Take 81 mg by mouth daily.     denosumab (PROLIA) 60 MG/ML SOSY injection Inject 60 mg into the skin every 6 (six) months. 60 mL 0   feeding supplement (BOOST HIGH PROTEIN) LIQD Take 1 Container by mouth daily.     lisinopril (ZESTRIL) 10 MG tablet TAKE 1 TABLET(10 MG) BY MOUTH DAILY 90 tablet 3   simvastatin (ZOCOR) 20 MG tablet Take 1 tablet (20 mg total) by mouth daily. 90 tablet 1    No current facility-administered medications for this visit.    REVIEW OF SYSTEMS:    10 Point review of Systems was done is negative except as noted above.   PHYSICAL EXAMINATION:  .BP (!) 174/66   Pulse 73   Temp 97.8 F (36.6 C)   Resp 18   Wt 120 lb 12.8 oz (54.8 kg)   SpO2 99%   BMI 19.80 kg/m   GENERAL:alert, in no acute distress and comfortable SKIN: no acute rashes, no significant lesions EYES: conjunctiva are pink and non-injected, sclera anicteric OROPHARYNX: MMM, no exudates, no oropharyngeal erythema or ulceration NECK: supple, no JVD LYMPH:  no palpable lymphadenopathy in the cervical, axillary  or inguinal regions LUNGS: clear to auscultation b/l with normal respiratory effort HEART: regular rate & rhythm ABDOMEN:  normoactive bowel sounds , non tender, not distended. Extremity: no pedal edema PSYCH: alert & oriented x 3 with fluent speech NEURO: no focal motor/sensory deficits    LABORATORY DATA:  I have reviewed the data as listed  .    Latest Ref Rng & Units 04/03/2023    8:52 AM 01/21/2023    8:56 AM 09/12/2022    9:11 AM  CBC  WBC 4.0 - 10.5 K/uL 4.1  3.8  3.7   Hemoglobin 12.0 - 15.0 g/dL 9.6  16.1  09.6   Hematocrit 36.0 - 46.0 % 31.3  34.5  33.4   Platelets 150 - 400 K/uL 185  211  167     .    Latest Ref Rng & Units 04/03/2023    8:52 AM 01/21/2023    8:56 AM 09/12/2022    9:11 AM  CMP  Glucose 70 - 99 mg/dL 80  82  85   BUN 8 - 23 mg/dL 19  22  23    Creatinine 0.44 - 1.00 mg/dL 0.45  4.09  8.11   Sodium 135 - 145 mmol/L 139  138  138   Potassium 3.5 - 5.1 mmol/L 4.0  4.0  4.6   Chloride 98 - 111 mmol/L 108  105  107   CO2 22 - 32 mmol/L 28  29  28    Calcium 8.9 - 10.3 mg/dL 9.7  91.4  9.3   Total Protein 6.5 - 8.1 g/dL 78.2  95.6  21.3   Total Bilirubin 0.3 - 1.2 mg/dL 0.3  0.3  0.2   Alkaline Phos 38 - 126 U/L 47  48  40   AST 15 - 41 U/L 14  16  14    ALT 0 - 44 U/L 6  7  8       Component     Latest Ref Rng & Units  06/21/2019 07/28/2019 08/01/2019  Total Protein, Urine-UPE24     Not Estab. mg/dL   8.2  Total Protein, Urine-Ur/day     30 - 150 mg/24 hr   180 (H)  ALBUMIN, U     %   19.7  ALPHA 1 URINE     %   1.5  Alpha 2, Urine     %   6.0  % BETA, Urine     %   21.4  GAMMA GLOBULIN URINE     %   51.4  Free Kappa Lt Chains,Ur     0.63 - 113.79 mg/L   383.74 (H)  Free Lambda Lt Chains,Ur     0.47 - 11.77 mg/L   0.68  Free Kappa/Lambda Ratio     1.03 - 31.76   564.32 (H)  Immunofixation Result, Urine        Comment  Total Volume        2,200  M-SPIKE %, Urine     Not Observed %   28.3 (H)  M-Spike, mg/24 hr     Not Observed mg/24 hr   51 (H)  NOTE:        Comment  IgG (Immunoglobin G), Serum     586 - 1,602 mg/dL  0,865 (H)   IgA     64 - 422 mg/dL  99   IgM (Immunoglobulin M), Srm     26 - 217 mg/dL  15 (L)   Total Protein ELP  6.0 - 8.5 g/dL  16.1 (H)   Albumin SerPl Elph-Mcnc     2.9 - 4.4 g/dL  4.1   Alpha 1     0.0 - 0.4 g/dL  0.3   Alpha2 Glob SerPl Elph-Mcnc     0.4 - 1.0 g/dL  0.8   B-Globulin SerPl Elph-Mcnc     0.7 - 1.3 g/dL  1.1   Gamma Glob SerPl Elph-Mcnc     0.4 - 1.8 g/dL  3.9 (H)   M Protein SerPl Elph-Mcnc     Not Observed g/dL  3.4 (H)   Globulin, Total     2.2 - 3.9 g/dL  6.1 (H)   Albumin/Glob SerPl     0.7 - 1.7  0.7   IFE 1       Comment   Please Note (HCV):       Comment   Creatinine, Urine     20 - 275 mg/dL 096    Protein/Creat Ratio     21 - 161 mg/g creat 209 (H)    Protein/Creatinine Ratio     0.021 - 0.16 mg/mg creat 0.209 (H)    Total Protein, Urine     5 - 24 mg/dL 29 (H)    Albumin     % 44    Alpha-1-Globulin, U     % 3    ALPHA-2-GLOBULIN, U     % 12    Beta Globulin, U     % 9    Gamma Globulin, U     % 33    Abnormal Protein Band     NONE DETEC mg/dL 7 (H)    Abnormal Protein Band     NONE DETEC mg/dL 1 (H)    Interpretation          Iron     41 - 142 ug/dL  39 (L)   TIBC     045 - 444 ug/dL  409    Saturation Ratios     21 - 57 %  16 (L)   UIBC     120 - 384 ug/dL  811   Kappa free light chain     3.3 - 19.4 mg/L  412.0 (H)   Lamda free light chains     5.7 - 26.3 mg/L  8.0   Kappa, lamda light chain ratio     0.26 - 1.65  51.50 (H)   Sed Rate     0 - 22 mm/hr  110 (H)   LDH     98 - 192 U/L  135   Ferritin     11 - 307 ng/mL  154   Vitamin B12     180 - 914 pg/mL  709     09/02/2019 Bone Marrow Biopsy ([id number])   08/05/2019 Bone Marrow Biopsy ([id number])        RADIOGRAPHIC STUDIES: I have personally reviewed the radiological images as listed and agreed with the findings in the report. No results found.   ASSESSMENT & PLAN:  Jasmine Fletcher is a 89 y.o. female with:  #1 IgG Kappa Monoclonal paraproteinemia with M protein of 3.9 -highly concerning for MM  06/14/2019 SPEP revealed total protein at 10.1, alpha 2 at 1.0, gamma globulin at 4.1, Abnormal protein band1 at 3.9 UPEP +ve for Bence Jones protein  08/08/2019 PET scan with results revealing "Two right thyroid nodules are both highly hypermetabolic. A significant minority of hypermetabolic thyroid lesions represent thyroid malignancy. Further workup with  thyroid ultrasound is recommended. A small left upper para-aortic lymph node is mildly hypermetabolic with maximum SUV 4.7, significance uncertain. No significant abnormal accentuated osseous activity or compelling focal findings of active multiple myeloma in the skeleton. Hypodense lesions in the liver are photopenic and likely benign Hyperdense small lesions of the left kidney upper pole are too small to characterize but statistically most likely to be complex cysts. If the patient has hematuria or if otherwise clinically warranted, renal protocol MRI with and without contrast could be utilized for further characterization. Mild mid to distal accentuated thoracic esophageal activity with maximum SUV of 4.8; mildly accentuated activity in the gastric  cardia/gastroesophageal junction with maximum SUV 5.8. Both could be physiologic but rarely low-grade malignancy could have a similar appearance, upper endoscopy could be utilized for further workup if clinically warranted. Other imaging findings of potential clinical significance: Chronic ischemic microvascular white matter disease intracranially. Aortic Atherosclerosis (ICD10-I70.0). Mild cardiomegaly. Biapical pleuroparenchymal scarring and probable scarring in the superior segment left lower lobe. Calcified uterine fibroids. Thoracolumbar scoliosis. No clear bone lesions."  08/05/2019 bone marrow biopsy ([id number]) with results revealing "no monoclonal b cell population or abnormal T cell phenotype identified. Atypical lymphoplasmacytoid aggregates - not clear for myeloma  09/02/2019 bone marrow biopsy ([id number]) with results revealing small popoulation of monoclonal plasma cell. Does not appear to co-related with M protein levels and seem to be underrepresented.  #2. FDG avid thyroid nodules  #3.  Patient Active Problem List   Diagnosis Date Noted   Multiple myeloma not having achieved remission (HCC) 02/10/2022   Depression, major, single episode, complete remission (HCC) 02/13/2020   Smoldering myeloma 10/17/2019   Vitamin D deficiency 10/17/2019   Dementia without behavioral disturbance (HCC) 07/01/2017   Mixed hyperlipidemia 11/29/2013   Osteoporosis 03/15/2013   BRADYCARDIA 02/17/2009   HYPERTENSION, BENIGN ESSENTIAL 02/16/2009   ANEMIA, HYPOCHROMIC 07/26/2007   DISORDER, TOBACCO USE 07/26/2007   DEPRESSION 07/26/2007   CARDIAC CATHETERIZATION, LEFT, HX OF 07/26/2007    PLAN:  -Discussed lab results on 04/03/2023 in detail with patient. CBC showed WBC of 4.1K, hemoglobin of 9.6, and platelets of 185K. -fluctuating hemoglobin, mild anemia, may be a function of diet -CMP shows calcium normalized to 9.7, no hypercalcemia -total protein stable -Discussed 03/10/2023 Bone Survey  results which revealed: 1. No lytic or destructive bone lesions to suggest myeloma are seen by radiograph. 2. Diffuse degenerative change throughout the cervical, thoracic, and lumbar spine with scoliosis. -Myeloma panel 01/21/2023 showed M protein of 3.7 -Myeloma panel today pending -encouraged patient to optimize nutrition with a well-balanced diet -recommend a general OTC multivitamin or at least a B complex vitamin supplement to optimize nutrition -continue to supplement diet with Boost to improve nutrition -continue to drink at least 2L of water daily to maintain adequate hydration levels -discussed options of adult day programs, church programs, and activities to increase social contact -connected patient with Child psychotherapist for resources such as support groups or programs to encourage socialization  -informed patient that slightly elevated BP reading may be related to sleep disturbances or stressors -continue to monitor with labs every 3 months  FOLLOW UP: Phone visit with Dr Candise Che in 3 months Labs 2 weeks prior to phone visit  The total time spent in the appointment was 25 minutes* .  All of the patient's questions were answered with apparent satisfaction. The patient knows to call the clinic with any problems, questions or concerns.   Wyvonnia Lora MD MS AAHIVMS [facility] Hematology/Oncology Physician  [facility]  .*Total Encounter Time as defined by the Centers for Medicare and Medicaid Services includes, in addition to the face-to-face time of a patient visit (documented in the note above) non-face-to-face time: obtaining and reviewing outside history, ordering and reviewing medications, tests or procedures, care coordination (communications with other health care professionals or caregivers) and documentation in the medical record.    I,Jasmine Fletcher,acting as a Neurosurgeon for Wyvonnia Lora, MD.,have documented all relevant documentation on the behalf of Wyvonnia Lora, MD,as  directed by  Wyvonnia Lora, MD while in the presence of Wyvonnia Lora, MD.  .I have reviewed the above documentation for accuracy and completeness, and I agree with the above. Jasmine Maine MD

## 2023-04-03 ENCOUNTER — Inpatient Hospital Stay (HOSPITAL_BASED_OUTPATIENT_CLINIC_OR_DEPARTMENT_OTHER): Payer: 59 | Admitting: Hematology

## 2023-04-03 ENCOUNTER — Emergency Department (HOSPITAL_COMMUNITY): Payer: 59

## 2023-04-03 ENCOUNTER — Encounter (HOSPITAL_COMMUNITY): Payer: Self-pay

## 2023-04-03 ENCOUNTER — Other Ambulatory Visit: Payer: Self-pay

## 2023-04-03 ENCOUNTER — Inpatient Hospital Stay: Payer: 59

## 2023-04-03 ENCOUNTER — Emergency Department (HOSPITAL_COMMUNITY)
Admission: EM | Admit: 2023-04-03 | Discharge: 2023-04-03 | Disposition: A | Discharge status: Home / Self Care | Payer: 59 | Attending: Emergency Medicine | Admitting: Emergency Medicine

## 2023-04-03 VITALS — BP 174/66 | HR 73 | Temp 97.8°F | Resp 18 | Wt 120.8 lb

## 2023-04-03 DIAGNOSIS — D472 Monoclonal gammopathy: Secondary | ICD-10-CM

## 2023-04-03 DIAGNOSIS — Z7982 Long term (current) use of aspirin: Secondary | ICD-10-CM | POA: Diagnosis not present

## 2023-04-03 DIAGNOSIS — D509 Iron deficiency anemia, unspecified: Secondary | ICD-10-CM | POA: Diagnosis not present

## 2023-04-03 DIAGNOSIS — R519 Headache, unspecified: Secondary | ICD-10-CM | POA: Diagnosis present

## 2023-04-03 DIAGNOSIS — S0990XA Unspecified injury of head, initial encounter: Secondary | ICD-10-CM | POA: Diagnosis not present

## 2023-04-03 DIAGNOSIS — S0012XA Contusion of left eyelid and periocular area, initial encounter: Secondary | ICD-10-CM | POA: Diagnosis not present

## 2023-04-03 DIAGNOSIS — W19XXXA Unspecified fall, initial encounter: Secondary | ICD-10-CM | POA: Diagnosis not present

## 2023-04-03 LAB — CBC WITH DIFFERENTIAL (CANCER CENTER ONLY)
Abs Immature Granulocytes: 0.01 10*3/uL (ref 0.00–0.07)
Basophils Absolute: 0 10*3/uL (ref 0.0–0.1)
Basophils Relative: 1 %
Eosinophils Absolute: 0 10*3/uL (ref 0.0–0.5)
Eosinophils Relative: 1 %
HCT: 31.3 % — ABNORMAL LOW (ref 36.0–46.0)
Hemoglobin: 9.6 g/dL — ABNORMAL LOW (ref 12.0–15.0)
Immature Granulocytes: 0 %
Lymphocytes Relative: 33 %
Lymphs Abs: 1.4 10*3/uL (ref 0.7–4.0)
MCH: 27 pg (ref 26.0–34.0)
MCHC: 30.7 g/dL (ref 30.0–36.0)
MCV: 88.2 fL (ref 80.0–100.0)
Monocytes Absolute: 0.6 10*3/uL (ref 0.1–1.0)
Monocytes Relative: 14 %
Neutro Abs: 2.1 10*3/uL (ref 1.7–7.7)
Neutrophils Relative %: 51 %
Platelet Count: 185 10*3/uL (ref 150–400)
RBC: 3.55 MIL/uL — ABNORMAL LOW (ref 3.87–5.11)
RDW: 14.6 % (ref 11.5–15.5)
WBC Count: 4.1 10*3/uL (ref 4.0–10.5)
nRBC: 0 % (ref 0.0–0.2)

## 2023-04-03 LAB — CMP (CANCER CENTER ONLY)
ALT: 6 U/L (ref 0–44)
AST: 14 U/L — ABNORMAL LOW (ref 15–41)
Albumin: 3.4 g/dL — ABNORMAL LOW (ref 3.5–5.0)
Alkaline Phosphatase: 47 U/L (ref 38–126)
Anion gap: 3 — ABNORMAL LOW (ref 5–15)
BUN: 19 mg/dL (ref 8–23)
CO2: 28 mmol/L (ref 22–32)
Calcium: 9.7 mg/dL (ref 8.9–10.3)
Chloride: 108 mmol/L (ref 98–111)
Creatinine: 0.89 mg/dL (ref 0.44–1.00)
GFR, Estimated: >60 mL/min (ref >60)
Glucose, Bld: 80 mg/dL (ref 70–99)
Potassium: 4 mmol/L (ref 3.5–5.1)
Sodium: 139 mmol/L (ref 135–145)
Total Bilirubin: 0.3 mg/dL (ref 0.3–1.2)
Total Protein: 10 g/dL — ABNORMAL HIGH (ref 6.5–8.1)

## 2023-04-03 NOTE — ED Notes (Signed)
Pt discharge home. Discharge information discussed. No s/s of distress observed during discharge.

## 2023-04-03 NOTE — Discharge Instructions (Signed)
As discussed, your evaluation today has been largely reassuring.  But, it is important that you monitor your condition carefully, and do not hesitate to return to the ED if you develop new, or concerning changes in your condition. ? ?Otherwise, please follow-up with your physician for appropriate ongoing care. ? ?

## 2023-04-03 NOTE — ED Provider Notes (Signed)
[place] EMERGENCY DEPARTMENT AT [facility] Provider Note   CSN: [id number] Arrival date & time: 04/03/23  1047     History  No chief complaint on file.   Jasmine Fletcher is a 89 y.o. female.  HPI Patient presents after a fall with pain in her head. Patient is awake, alert, accompanied by her daughter. Patient states that she is in minimal discomfort, but with pressure she has pain about the left temporal region.  Patient was in her usual state of health, feeling good according to her and her daughter, when she was leaning against the wall, fell to the ground.  She was brought here for evaluation.  She denies weakness in any extremity, any discomfort at all beyond her head, including no neck pain. Patient was awaiting follow-up visit for her oncologist.    Home Medications Prior to Admission medications   Medication Sig Start Date End Date Taking? Authorizing Provider  aspirin 81 MG tablet Take 81 mg by mouth daily.    [provider]  denosumab (PROLIA) 60 MG/ML SOSY injection Inject 60 mg into the skin every 6 (six) months. 12/23/22   [name], NP  feeding supplement (BOOST HIGH PROTEIN) LIQD Take 1 Container by mouth daily.    [provider]  lisinopril (ZESTRIL) 10 MG tablet TAKE 1 TABLET(10 MG) BY MOUTH DAILY 11/03/22   [name], NP  simvastatin (ZOCOR) 20 MG tablet Take 1 tablet (20 mg total) by mouth daily. 11/03/22   [name], NP      Allergies    Patient has no known allergies.    Review of Systems   Review of Systems  All other systems reviewed and are negative.   Physical Exam Updated Vital Signs BP (!) 176/63 (BP Location: Right Arm)   Pulse (!) 57   Temp 98.4 F (36.9 C) (Oral)   Resp 18   Ht 5\' 9"  (1.753 m)   Wt 54.4 kg   SpO2 100%   BMI 17.72 kg/m  Physical Exam Vitals and nursing note reviewed.  Constitutional:      General: She is not in acute distress.    Appearance: She is  well-developed.  HENT:     Head: Normocephalic.   Eyes:     Conjunctiva/sclera: Conjunctivae normal.  Cardiovascular:     Rate and Rhythm: Normal rate and regular rhythm.  Pulmonary:     Effort: Pulmonary effort is normal. No respiratory distress.     Breath sounds: Normal breath sounds. No stridor.  Abdominal:     General: There is no distension.  Musculoskeletal:     Cervical back: Full passive range of motion without pain. No spinous process tenderness or muscular tenderness.  Skin:    General: Skin is warm and dry.  Neurological:     Mental Status: She is alert and oriented to person, place, and time.     Cranial Nerves: No cranial nerve deficit.     Comments: Beyond atrophy no focal neuro changes patient moves all extremities well spontaneously to command.  Face is symmetric, speech is clear.  Psychiatric:        Mood and Affect: Mood normal.     ED Results / Procedures / Treatments   Labs (all labs ordered are listed, but only abnormal results are displayed) Labs Reviewed - No data to display  EKG None  Radiology Results reviewed  Procedures Procedures    Medications Ordered in ED Medications - No data  to display  ED Course/ Medical Decision Making/ A&P                             Medical Decision Making Adult female with multiple medical problems, including advanced age presents after a fall with head trauma.  Differential includes fracture, intracranial hemorrhage, less likely other injuries given her denial of concerns.  CT ordered.  Amount and/or Complexity of Data Reviewed Independent Historian: caregiver    Details: Daughter at bedside assists with history Radiology: ordered and independent interpretation performed. Decision-making details documented in ED Course.  Risk Prescription drug management. Decision regarding hospitalization.   Update: Patient in no distress, awake, alert, no new complaints.  I reviewed the CT, discussed with her and  her daughter.  Absent evidence for fracture, hemorrhage, complaints, neuro compromise or deficits patient appropriate for close outpatient follow-up.         Final Clinical Impression(s) / ED Diagnoses Final diagnoses:  Fall, initial encounter  Injury of head, initial encounter    Rx / DC Orders ED Discharge Orders     None         Gerhard Munch, MD 04/03/23 1305

## 2023-04-03 NOTE — ED Triage Notes (Signed)
[facility] Nurse states pt was leaning against wall and just went down. Swelling to left eyebrow area observed. Nurse states pts labs were good. Pt states she does not remember what happened and denies any pain. She was brought to ED for further evaluation.

## 2023-04-03 NOTE — Progress Notes (Signed)
10:15 04/03/23: This RN was asked to come out in lobby that pt was on the floor. Pt had ambulated to lobby using her cane and was waiting right inside of the [facility] doorway, waiting for her Daughter's car to be brought around. Pt was observed to be on then floor, no observer to her falling. Pt had bleeding from her temple and was oriented to person only. Pt was assisted to a wheelchair and brought to a treatment room to be assessed by MD. Pt remained oriented to person only for approximately 10 mins. Pt's CBG 92, pt given juice and fruit to eat and pt became oriented X3. Pt examined by Dr Candise Che.Pt did not recall what had happened. Pt taken to ED for further evaluation.

## 2023-04-06 ENCOUNTER — Telehealth: Payer: Self-pay | Admitting: Hematology

## 2023-04-10 LAB — MULTIPLE MYELOMA PANEL, SERUM
Albumin SerPl Elph-Mcnc: 3.6 g/dL (ref 2.9–4.4)
Albumin/Glob SerPl: 0.7 (ref 0.7–1.7)
Alpha 1: 0.3 g/dL (ref 0.0–0.4)
Alpha2 Glob SerPl Elph-Mcnc: 0.8 g/dL (ref 0.4–1.0)
B-Globulin SerPl Elph-Mcnc: 0.9 g/dL (ref 0.7–1.3)
Gamma Glob SerPl Elph-Mcnc: 3.8 g/dL — ABNORMAL HIGH (ref 0.4–1.8)
Globulin, Total: 5.8 g/dL — ABNORMAL HIGH (ref 2.2–3.9)
IgA: 58 mg/dL — ABNORMAL LOW (ref 64–422)
IgG (Immunoglobin G), Serum: 5416 mg/dL — ABNORMAL HIGH (ref 586–1602)
IgM (Immunoglobulin M), Srm: 12 mg/dL — ABNORMAL LOW (ref 26–217)
M Protein SerPl Elph-Mcnc: 3.2 g/dL — ABNORMAL HIGH
Total Protein ELP: 9.4 g/dL — ABNORMAL HIGH (ref 6.0–8.5)

## 2023-05-11 ENCOUNTER — Other Ambulatory Visit: Payer: Self-pay | Admitting: Nurse Practitioner

## 2023-05-11 DIAGNOSIS — E782 Mixed hyperlipidemia: Secondary | ICD-10-CM

## 2023-05-19 ENCOUNTER — Telehealth: Payer: Self-pay

## 2023-05-19 NOTE — Telephone Encounter (Signed)
Disability Parking Placard was dropped off at the front desk by patients daughter [name]. I placed call to [name] to inform her that the earliest [place], Jessica K, NP will be available to sign placard will be Friday and she said that is ok.   Form was placed in [name], NP review and sign folder

## 2023-06-03 ENCOUNTER — Telehealth: Payer: Self-pay | Admitting: Hematology

## 2023-06-04 ENCOUNTER — Encounter: Payer: Self-pay | Admitting: Nurse Practitioner

## 2023-06-05 ENCOUNTER — Encounter: Payer: Self-pay | Admitting: Nurse Practitioner

## 2023-06-05 ENCOUNTER — Ambulatory Visit (INDEPENDENT_AMBULATORY_CARE_PROVIDER_SITE_OTHER): Payer: 59 | Admitting: Nurse Practitioner

## 2023-06-05 VITALS — BP 118/72 | HR 59 | Temp 97.3°F | Ht 69.0 in | Wt 122.0 lb

## 2023-06-05 DIAGNOSIS — E782 Mixed hyperlipidemia: Secondary | ICD-10-CM

## 2023-06-05 DIAGNOSIS — F0390 Unspecified dementia without behavioral disturbance: Secondary | ICD-10-CM

## 2023-06-05 DIAGNOSIS — Z66 Do not resuscitate: Secondary | ICD-10-CM | POA: Diagnosis not present

## 2023-06-05 DIAGNOSIS — R634 Abnormal weight loss: Secondary | ICD-10-CM | POA: Diagnosis not present

## 2023-06-05 DIAGNOSIS — M810 Age-related osteoporosis without current pathological fracture: Secondary | ICD-10-CM | POA: Diagnosis not present

## 2023-06-05 DIAGNOSIS — G4720 Circadian rhythm sleep disorder, unspecified type: Secondary | ICD-10-CM | POA: Diagnosis not present

## 2023-06-05 DIAGNOSIS — D472 Monoclonal gammopathy: Secondary | ICD-10-CM | POA: Diagnosis not present

## 2023-06-05 DIAGNOSIS — I10 Essential (primary) hypertension: Secondary | ICD-10-CM

## 2023-06-05 NOTE — Progress Notes (Signed)
Careteam: Patient Care Team: [name], NP as PCP - General (Geriatric Medicine)  PLACE OF SERVICE:  [facility]  Advanced Directive information Does Patient Have a Medical Advance Directive?: Yes, Type of Advance Directive: Out of facility DNR (pink MOST or yellow form), Pre-existing out of facility DNR order (yellow form or pink MOST form): Yellow form placed in chart (order not valid for inpatient use);Pink MOST form placed in chart (order not valid for inpatient use), Does patient want to make changes to medical advance directive?: No - Patient declined  No Known Allergies  Chief Complaint  Patient presents with   Medical Management of Chronic Issues    4 mont follow-up. Discuss need for shingrix, td/tdap, and covid boosters. Patient c/o trouble falling and staying asleep. Patient also c/o ache in right knee. Here with daughter, [name]      HPI: Patient is a 89 y.o. female for routine follow up.   Pt continues to follow up with oncology due to MGUS, recently had blood work.   She had a fall in April, went to ED. Unsure what happened. Blacked out, no pain or did not get hurt. Daughter reports she had not eaten or drank anything that morning.   OP- continues on prolia   Htn- blood pressures controlled at home. Continues on lisinopril.   Hyperlipidemia- on zocor.   Sometimes she can sleep, other time she does not. Sleeps a lot during the day.  Sleeping better during the day.  Any little noise will wake her up.  Also can hear her neighbor and that will wake her up.    Review of Systems:  Review of Systems  Constitutional:  Negative for chills, fever and weight loss.  HENT:  Negative for tinnitus.   Respiratory:  Negative for cough, sputum production and shortness of breath.   Cardiovascular:  Negative for chest pain, palpitations and leg swelling.  Gastrointestinal:  Negative for abdominal pain, constipation, diarrhea and heartburn.  Genitourinary:  Negative  for dysuria, frequency and urgency.  Musculoskeletal:  Negative for back pain, falls, joint pain and myalgias.  Skin: Negative.   Neurological:  Negative for dizziness and headaches.  Psychiatric/Behavioral:  Negative for depression and memory loss. The patient does not have insomnia.     Past Medical History:  Diagnosis Date   History of skin cancer    Dr.John Hall   Hyperlipidemia    Hypertension    Osteoporosis    Past Surgical History:  Procedure Laterality Date   ABDOMINAL HYSTERECTOMY  1998   Social History:   reports that she quit smoking about 20 years ago. Her smoking use included cigarettes. She smoked an average of 1 pack per day. She has never used smokeless tobacco. She reports that she does not drink alcohol and does not use drugs.  Family History  Problem Relation Age of Onset   Cancer Father    Dementia Sister    Breast cancer Maternal Grandmother    Breast cancer Paternal Grandmother     Medications: Patient's Medications  New Prescriptions   No medications on file  Previous Medications   ASPIRIN 81 MG TABLET    Take 81 mg by mouth daily.   DENOSUMAB (PROLIA) 60 MG/ML SOSY INJECTION    Inject 60 mg into the skin every 6 (six) months.   FEEDING SUPPLEMENT (BOOST HIGH PROTEIN) LIQD    Take 2 Containers by mouth daily.   LISINOPRIL (ZESTRIL) 10 MG TABLET    TAKE 1  TABLET(10 MG) BY MOUTH DAILY   SIMVASTATIN (ZOCOR) 20 MG TABLET    TAKE 1 TABLET(20 MG) BY MOUTH DAILY  Modified Medications   No medications on file  Discontinued Medications   No medications on file    Physical Exam:  Vitals:   06/05/23 0928  BP: 118/72  Pulse: (!) 59  Temp: (!) 97.3 F (36.3 C)  TempSrc: Temporal  SpO2: 99%  Weight: 122 lb (55.3 kg)  Height: 5\' 9"  (1.753 m)   Body mass index is 18.02 kg/m. Wt Readings from Last 3 Encounters:  06/05/23 122 lb (55.3 kg)  04/03/23 120 lb (54.4 kg)  04/03/23 120 lb 12.8 oz (54.8 kg)    Physical Exam Constitutional:       General: She is not in acute distress.    Appearance: She is well-developed. She is not diaphoretic.  HENT:     Head: Normocephalic and atraumatic.     Mouth/Throat:     Pharynx: No oropharyngeal exudate.  Eyes:     Conjunctiva/sclera: Conjunctivae normal.     Pupils: Pupils are equal, round, and reactive to light.  Cardiovascular:     Rate and Rhythm: Normal rate and regular rhythm.     Heart sounds: Normal heart sounds.  Pulmonary:     Effort: Pulmonary effort is normal.     Breath sounds: Normal breath sounds.  Abdominal:     General: Bowel sounds are normal.     Palpations: Abdomen is soft.  Musculoskeletal:     Cervical back: Normal range of motion and neck supple.     Right lower leg: No edema.     Left lower leg: No edema.  Skin:    General: Skin is warm and dry.  Neurological:     Mental Status: She is alert.  Psychiatric:        Mood and Affect: Mood normal.     Labs reviewed: Basic Metabolic Panel: Recent Labs    09/12/22 0911 01/21/23 0856 04/03/23 0852  NA 138 138 139  K 4.6 4.0 4.0  CL 107 105 108  CO2 28 29 28   GLUCOSE 85 82 80  BUN 23 22 19   CREATININE 0.91 0.96 0.89  CALCIUM 9.3 10.8* 9.7   Liver Function Tests: Recent Labs    09/12/22 0911 01/21/23 0856 04/03/23 0852  AST 14* 16 14*  ALT 8 7 6   ALKPHOS 40 48 47  BILITOT 0.2* 0.3 0.3  PROT 10.3* 10.1* 10.0*  ALBUMIN 3.4* 3.5 3.4*   No results for input(s): "LIPASE", "AMYLASE" in the last 8760 hours. No results for input(s): "AMMONIA" in the last 8760 hours. CBC: Recent Labs    09/12/22 0911 01/21/23 0856 04/03/23 0852  WBC 3.7* 3.8* 4.1  NEUTROABS 2.1 1.8 2.1  HGB 10.6* 11.0* 9.6*  HCT 33.4* 34.5* 31.3*  MCV 87.7 86.0 88.2  PLT 167 211 185   Lipid Panel: Recent Labs    06/27/22 0922  CHOL 134  HDL 45*  LDLCALC 73  TRIG 79  CHOLHDL 3.0   TSH: No results for input(s): "TSH" in the last 8760 hours. A1C: No results found for: "HGBA1C"   Assessment/Plan 1.  Osteoporosis, unspecified osteoporosis type, unspecified pathological fracture presence -continues on prolia q 6 months  2. Smoldering myeloma Followed by oncologist  3. Weight loss -up 2 lbs, continue 3 meals a day with supplements   4. Dementia without behavioral disturbance (HCC) Stable, no acute changes in cognitive or functional status, continue supportive care.  5. HYPERTENSION, BENIGN ESSENTIAL -Blood pressure well controlled, goal bp <150/90 due to age Continue current medications and dietary modifications follow metabolic panel  6. Mixed hyperlipidemia -continues on zocor, follow up lipids at next visit.   7. DNR (do not resuscitate) - Do not attempt resuscitation (DNR)  8. Sleep-wake cycle disorder -sleeps more during the day then at night.  To limit naps if she wants to sleep better at night Consider sound machine to help with outside noise.    Return in about 6 months (around 12/05/2023) for routine follow up, labs at appt .  Janene Harvey. Biagio Borg [facility] [phone]

## 2023-06-23 ENCOUNTER — Other Ambulatory Visit: Payer: 59

## 2023-06-23 ENCOUNTER — Other Ambulatory Visit: Payer: Self-pay | Admitting: *Deleted

## 2023-06-23 MED ORDER — DENOSUMAB 60 MG/ML ~~LOC~~ SOSY: 60.0000 mg | PREFILLED_SYRINGE | SUBCUTANEOUS | 0 refills | Status: DC

## 2023-06-23 NOTE — Telephone Encounter (Signed)
Patient is due for Prolia injection in August.  Patient gets Prolia through her pharmacy, Rx sent to pharmacy.

## 2023-07-01 ENCOUNTER — Telehealth: Payer: 59 | Admitting: Hematology

## 2023-07-13 ENCOUNTER — Other Ambulatory Visit: Payer: Self-pay

## 2023-07-13 DIAGNOSIS — D472 Monoclonal gammopathy: Secondary | ICD-10-CM

## 2023-07-14 ENCOUNTER — Other Ambulatory Visit: Payer: Self-pay

## 2023-07-14 ENCOUNTER — Inpatient Hospital Stay: Payer: 59 | Attending: Hematology

## 2023-07-14 DIAGNOSIS — Z87891 Personal history of nicotine dependence: Secondary | ICD-10-CM | POA: Diagnosis not present

## 2023-07-14 DIAGNOSIS — R7989 Other specified abnormal findings of blood chemistry: Secondary | ICD-10-CM | POA: Diagnosis not present

## 2023-07-14 DIAGNOSIS — C9000 Multiple myeloma not having achieved remission: Secondary | ICD-10-CM | POA: Diagnosis not present

## 2023-07-14 DIAGNOSIS — C73 Malignant neoplasm of thyroid gland: Secondary | ICD-10-CM | POA: Diagnosis not present

## 2023-07-14 DIAGNOSIS — R5383 Other fatigue: Secondary | ICD-10-CM | POA: Diagnosis not present

## 2023-07-14 DIAGNOSIS — D472 Monoclonal gammopathy: Secondary | ICD-10-CM

## 2023-07-14 LAB — CBC WITH DIFFERENTIAL (CANCER CENTER ONLY)
Abs Immature Granulocytes: 0.01 10*3/uL (ref 0.00–0.07)
Basophils Absolute: 0 10*3/uL (ref 0.0–0.1)
Basophils Relative: 1 %
Eosinophils Absolute: 0 10*3/uL (ref 0.0–0.5)
Eosinophils Relative: 0 %
HCT: 31.9 % — ABNORMAL LOW (ref 36.0–46.0)
Hemoglobin: 10 g/dL — ABNORMAL LOW (ref 12.0–15.0)
Immature Granulocytes: 0 %
Lymphocytes Relative: 32 %
Lymphs Abs: 1.1 10*3/uL (ref 0.7–4.0)
MCH: 26.8 pg (ref 26.0–34.0)
MCHC: 31.3 g/dL (ref 30.0–36.0)
MCV: 85.5 fL (ref 80.0–100.0)
Monocytes Absolute: 0.4 10*3/uL (ref 0.1–1.0)
Monocytes Relative: 12 %
Neutro Abs: 1.8 10*3/uL (ref 1.7–7.7)
Neutrophils Relative %: 55 %
Platelet Count: 183 10*3/uL (ref 150–400)
RBC: 3.73 MIL/uL — ABNORMAL LOW (ref 3.87–5.11)
RDW: 14.2 % (ref 11.5–15.5)
WBC Count: 3.3 10*3/uL — ABNORMAL LOW (ref 4.0–10.5)
nRBC: 0 % (ref 0.0–0.2)

## 2023-07-14 LAB — CMP (CANCER CENTER ONLY)
ALT: 7 U/L (ref 0–44)
AST: 14 U/L — ABNORMAL LOW (ref 15–41)
Albumin: 3.3 g/dL — ABNORMAL LOW (ref 3.5–5.0)
Alkaline Phosphatase: 39 U/L (ref 38–126)
Anion gap: 3 — ABNORMAL LOW (ref 5–15)
BUN: 24 mg/dL — ABNORMAL HIGH (ref 8–23)
CO2: 28 mmol/L (ref 22–32)
Calcium: 10.2 mg/dL (ref 8.9–10.3)
Chloride: 106 mmol/L (ref 98–111)
Creatinine: 0.97 mg/dL (ref 0.44–1.00)
GFR, Estimated: 56 mL/min — ABNORMAL LOW (ref >60)
Glucose, Bld: 85 mg/dL (ref 70–99)
Potassium: 4 mmol/L (ref 3.5–5.1)
Sodium: 137 mmol/L (ref 135–145)
Total Bilirubin: 0.3 mg/dL (ref 0.3–1.2)
Total Protein: 10.3 g/dL — ABNORMAL HIGH (ref 6.5–8.1)

## 2023-07-22 ENCOUNTER — Inpatient Hospital Stay (HOSPITAL_BASED_OUTPATIENT_CLINIC_OR_DEPARTMENT_OTHER): Payer: 59 | Admitting: Hematology

## 2023-07-22 DIAGNOSIS — C73 Malignant neoplasm of thyroid gland: Secondary | ICD-10-CM | POA: Diagnosis not present

## 2023-07-22 DIAGNOSIS — C9000 Multiple myeloma not having achieved remission: Secondary | ICD-10-CM | POA: Diagnosis not present

## 2023-07-22 DIAGNOSIS — R5383 Other fatigue: Secondary | ICD-10-CM | POA: Diagnosis not present

## 2023-07-22 DIAGNOSIS — Z87891 Personal history of nicotine dependence: Secondary | ICD-10-CM | POA: Diagnosis not present

## 2023-07-22 DIAGNOSIS — D472 Monoclonal gammopathy: Secondary | ICD-10-CM

## 2023-07-22 DIAGNOSIS — R7989 Other specified abnormal findings of blood chemistry: Secondary | ICD-10-CM | POA: Diagnosis not present

## 2023-07-22 NOTE — Progress Notes (Signed)
HEMATOLOGY/ONCOLOGY PHONE VISIT NOTE  Date of Service: 07/22/2023    Patient Care Team: [name], NP as PCP - General (Geriatric Medicine)   CHIEF COMPLAINTS:  Follow-up for continued evaluation and management of smoldering myeloma   HISTORY OF PRESENTING ILLNESS:   Jasmine Fletcher is a wonderful 89 y.o. female who has been referred to [place] by [name], NP for evaluation and management of elevated serum globulin levels. The pt reports that she is doing well overall.  The pt reports that her appetite has decreased recently but that she eats a healthy diet. She notes mild fatigue. Denies new bone pain, back pain, hip pain, infection concerns, and any other new concerns.  She has been getting a Prolia shot every 6 months for the past 2 years. She has also been taking Aricept but notes that her memory is worsening. Her daughter is her health care POA, but the pt makes her own healthcare decisions at this time.  Most recent lab results (06/14/2019) of CBC is as follows: all values are WNL except for HGB at 11.4, MCH at 26.8. 06/21/2019 UPEP revealed protein/creat ratio at 209, total protein at 29, and two abnormal protein bands 06/14/2019 SPEP revealed total protein at 10.1, Alpha 2 at 1.0, gamma globulin at 4.1, abnormal protein band1 at 3.9   INTERVAL HISTORY:  .Marland KitchenI connected with Jasmine Fletcher on 07/22/2023 at  8:40 AM EDT by telephone visit and verified that I am speaking with the correct person using two identifiers.   I discussed the limitations, risks, security and privacy concerns of performing an evaluation and management service by telemedicine and the availability of in-person appointments. I also discussed with the patient that there may be a patient responsible charge related to this service. The patient expressed understanding and agreed to proceed.   Other persons participating in the visit and their role in the encounter: Patient's  daughter  Patient's location: Home Provider's location: [facility]   Chief Complaint: Follow-up for multiple myeloma  Jasmine Fletcher is a 89 y.o. female, who is called for continued evaluation and management of her smoldering myeloma.  She notes continued chronic fatigue which has not changed. Slightly decreased p.o. intake.  No focal new bone pain. No fevers no chills no night sweats. Labs done recently were discussed in details.Marland Kitchen  MEDICAL HISTORY:  Past Medical History:  Diagnosis Date   History of skin cancer    Dr.John Hall   Hyperlipidemia    Hypertension    Osteoporosis     SURGICAL HISTORY: Past Surgical History:  Procedure Laterality Date   ABDOMINAL HYSTERECTOMY  1998    SOCIAL HISTORY: Social History   Socioeconomic History   Marital status: Single    Spouse name: Not on file   Number of children: Not on file   Years of education: Not on file   Highest education level: Not on file  Occupational History   Not on file  Tobacco Use   Smoking status: Former    Current packs/day: 0.00    Types: Cigarettes    Quit date: 12/08/2002    Years since quitting: 20.6   Smokeless tobacco: Never   Tobacco comments:    Quit t age 66   Vaping Use   Vaping status: Never Used  Substance and Sexual Activity   Alcohol use: No    Alcohol/week: 0.0 standard drinks of alcohol   Drug use: No   Sexual activity: Not Currently  Other Topics  Concern   Not on file  Social History Narrative   ** Merged History Encounter **       Social Determinants of Health   Financial Resource Strain: Low Risk  (06/04/2018)   Overall Financial Resource Strain (CARDIA)    Difficulty of Paying Living Expenses: Not hard at all  Food Insecurity: No Food Insecurity (06/04/2018)   Hunger Vital Sign    Worried About Running Out of Food in the Last Year: Never true    Ran Out of Food in the Last Year: Never true  Transportation Needs: No Transportation Needs (06/04/2018)   PRAPARE - Therapist, art (Medical): No    Lack of Transportation (Non-Medical): No  Physical Activity: Insufficiently Active (06/04/2018)   Exercise Vital Sign    Days of Exercise per Week: 3 days    Minutes of Exercise per Session: 30 min  Stress: No Stress Concern Present (06/04/2018)   [employer] of Occupational Health - Occupational Stress Questionnaire    Feeling of Stress : Not at all  Social Connections: Moderately Isolated (06/04/2018)   Social Connection and Isolation Panel [NHANES]    Frequency of Communication with Friends and Family: Three times a week    Frequency of Social Gatherings with Friends and Family: Three times a week    Attends Religious Services: Never    Active Member of Clubs or Organizations: No    Attends Banker Meetings: Never    Marital Status: Never married  Intimate Partner Violence: Not At Risk (06/04/2018)   Humiliation, Afraid, Rape, and Kick questionnaire    Fear of Current or Ex-Partner: No    Emotionally Abused: No    Physically Abused: No    Sexually Abused: No    FAMILY HISTORY: Family History  Problem Relation Age of Onset   Cancer Father    Dementia Sister    Breast cancer Maternal Grandmother    Breast cancer Paternal Grandmother     ALLERGIES:  has No Known Allergies.  MEDICATIONS:  Current Outpatient Medications  Medication Sig Dispense Refill   aspirin 81 MG tablet Take 81 mg by mouth daily.     denosumab (PROLIA) 60 MG/ML SOSY injection Inject 60 mg into the skin every 6 (six) months. 60 mL 0   feeding supplement (BOOST HIGH PROTEIN) LIQD Take 2 Containers by mouth daily.     lisinopril (ZESTRIL) 10 MG tablet TAKE 1 TABLET(10 MG) BY MOUTH DAILY 90 tablet 3   simvastatin (ZOCOR) 20 MG tablet TAKE 1 TABLET(20 MG) BY MOUTH DAILY 90 tablet 1   No current facility-administered medications for this visit.    REVIEW OF SYSTEMS:    10 Point review of Systems was done is negative except as noted above.    PHYSICAL EXAMINATION: Telemedicine visit  LABORATORY DATA:  I have reviewed the data as listed  .    Latest Ref Rng & Units 07/14/2023    9:17 AM 04/03/2023    8:52 AM 01/21/2023    8:56 AM  CBC  WBC 4.0 - 10.5 K/uL 3.3  4.1  3.8   Hemoglobin 12.0 - 15.0 g/dL 16.1  9.6  09.6   Hematocrit 36.0 - 46.0 % 31.9  31.3  34.5   Platelets 150 - 400 K/uL 183  185  211     .    Latest Ref Rng & Units 07/14/2023    9:17 AM 04/03/2023    8:52 AM 01/21/2023    8:56  AM  CMP  Glucose 70 - 99 mg/dL 85  80  82   BUN 8 - 23 mg/dL 24  19  22    Creatinine 0.44 - 1.00 mg/dL 9.60  4.54  0.98   Sodium 135 - 145 mmol/L 137  139  138   Potassium 3.5 - 5.1 mmol/L 4.0  4.0  4.0   Chloride 98 - 111 mmol/L 106  108  105   CO2 22 - 32 mmol/L 28  28  29    Calcium 8.9 - 10.3 mg/dL 11.9  9.7  14.7   Total Protein 6.5 - 8.1 g/dL 82.9  56.2  13.0   Total Bilirubin 0.3 - 1.2 mg/dL 0.3  0.3  0.3   Alkaline Phos 38 - 126 U/L 39  47  48   AST 15 - 41 U/L 14  14  16    ALT 0 - 44 U/L 7  6  7      09/02/2019 Bone Marrow Biopsy ([id number])   08/05/2019 Bone Marrow Biopsy ([id number])        RADIOGRAPHIC STUDIES: I have personally reviewed the radiological images as listed and agreed with the findings in the report. No results found.   ASSESSMENT & PLAN:  Jasmine Fletcher is a 89 y.o. female with:  #1 IgG Kappa Monoclonal paraproteinemia with M protein of 3.9 -highly concerning for MM  06/14/2019 SPEP revealed total protein at 10.1, alpha 2 at 1.0, gamma globulin at 4.1, Abnormal protein band1 at 3.9 UPEP +ve for Bence Jones protein  08/08/2019 PET scan with results revealing "Two right thyroid nodules are both highly hypermetabolic. A significant minority of hypermetabolic thyroid lesions represent thyroid malignancy. Further workup with thyroid ultrasound is recommended. A small left upper para-aortic lymph node is mildly hypermetabolic with maximum SUV 4.7, significance uncertain. No significant  abnormal accentuated osseous activity or compelling focal findings of active multiple myeloma in the skeleton. Hypodense lesions in the liver are photopenic and likely benign Hyperdense small lesions of the left kidney upper pole are too small to characterize but statistically most likely to be complex cysts. If the patient has hematuria or if otherwise clinically warranted, renal protocol MRI with and without contrast could be utilized for further characterization. Mild mid to distal accentuated thoracic esophageal activity with maximum SUV of 4.8; mildly accentuated activity in the gastric cardia/gastroesophageal junction with maximum SUV 5.8. Both could be physiologic but rarely low-grade malignancy could have a similar appearance, upper endoscopy could be utilized for further workup if clinically warranted. Other imaging findings of potential clinical significance: Chronic ischemic microvascular white matter disease intracranially. Aortic Atherosclerosis (ICD10-I70.0). Mild cardiomegaly. Biapical pleuroparenchymal scarring and probable scarring in the superior segment left lower lobe. Calcified uterine fibroids. Thoracolumbar scoliosis. No clear bone lesions."  08/05/2019 bone marrow biopsy ([id number]) with results revealing "no monoclonal b cell population or abnormal T cell phenotype identified. Atypical lymphoplasmacytoid aggregates - not clear for myeloma  09/02/2019 bone marrow biopsy ([id number]) with results revealing small popoulation of monoclonal plasma cell. Does not appear to co-related with M protein levels and seem to be underrepresented.  #2. FDG avid thyroid nodules  #3.  Patient Active Problem List   Diagnosis Date Noted   Multiple myeloma not having achieved remission (HCC) 02/10/2022   Depression, major, single episode, complete remission (HCC) 02/13/2020   Smoldering myeloma 10/17/2019   Vitamin D deficiency 10/17/2019   Dementia without behavioral disturbance (HCC)  07/01/2017   Mixed hyperlipidemia 11/29/2013   Osteoporosis 03/15/2013  BRADYCARDIA 02/17/2009   HYPERTENSION, BENIGN ESSENTIAL 02/16/2009   ANEMIA, HYPOCHROMIC 07/26/2007   DISORDER, TOBACCO USE 07/26/2007   DEPRESSION 07/26/2007   CARDIAC CATHETERIZATION, LEFT, HX OF 07/26/2007    PLAN:  -Discussed lab results on 07/14/2023 in details. -CBC shows stable mild anemia with a hemoglobin of 10 CMP stable except elevated total protein of 10.3 -Myeloma panel shows a bump in M protein from 3.2 to 4 g/dL. Patient is not going to pursue repeat evaluation including PET scan and bone marrow biopsy at this time and prefers to take a conservative approach. -Will continue to monitor closely for worsening anemia or anemia renal insufficiency or hypercalcemia as well as M protein levels in 3 months  FOLLOW UP: Phone visit with Dr Candise Che in 3 months Labs 2 weeks prior to phone visit  The total time spent in the appointment was 20 minutes*.  All of the patient's questions were answered with apparent satisfaction. The patient knows to call the clinic with any problems, questions or concerns.   Wyvonnia Lora MD MS AAHIVMS [facility] Hematology/Oncology Physician [facility]  .*Total Encounter Time as defined by the Centers for Medicare and Medicaid Services includes, in addition to the face-to-face time of a patient visit (documented in the note above) non-face-to-face time: obtaining and reviewing outside history, ordering and reviewing medications, tests or procedures, care coordination (communications with other health care professionals or caregivers) and documentation in the medical record.

## 2023-07-23 ENCOUNTER — Telehealth: Payer: Self-pay | Admitting: Hematology

## 2023-07-23 NOTE — Telephone Encounter (Signed)
Patient's daughter is aware of scheduled appointment times/dates

## 2023-08-24 ENCOUNTER — Encounter: Payer: Self-pay | Admitting: Nurse Practitioner

## 2023-08-24 ENCOUNTER — Ambulatory Visit (INDEPENDENT_AMBULATORY_CARE_PROVIDER_SITE_OTHER): Payer: 59 | Admitting: Nurse Practitioner

## 2023-08-24 VITALS — BP 130/80 | HR 72 | Temp 98.0°F | Resp 14 | Ht 69.0 in | Wt 118.0 lb

## 2023-08-24 DIAGNOSIS — Z0000 Encounter for general adult medical examination without abnormal findings: Secondary | ICD-10-CM | POA: Diagnosis not present

## 2023-08-24 DIAGNOSIS — Z23 Encounter for immunization: Secondary | ICD-10-CM

## 2023-08-24 DIAGNOSIS — H9193 Unspecified hearing loss, bilateral: Secondary | ICD-10-CM | POA: Diagnosis not present

## 2023-08-24 NOTE — Progress Notes (Signed)
Subjective:   Jasmine Fletcher is a 89 y.o. female who presents for Medicare Annual (Subsequent) preventive examination.  Visit Complete: In person  Cardiac Risk Factors include: advanced age (>74men, >45 women);hypertension;dyslipidemia;sedentary lifestyle     Objective:    Today's Vitals   08/24/23 0909  BP: 130/80  Pulse: 72  Resp: 14  Temp: 98 F (36.7 C)  SpO2: 98%  Weight: 118 lb (53.5 kg)  Height: 5\' 9"  (1.753 m)   Body mass index is 17.43 kg/m.     08/24/2023    9:07 AM 06/04/2023    4:48 PM 04/03/2023   10:56 AM 08/19/2022    8:52 AM 06/27/2022    7:58 AM 03/24/2022    8:43 AM 02/10/2022    9:52 AM  Advanced Directives  Does Patient Have a Medical Advance Directive? Yes Yes Yes Yes Yes Yes Yes  Type of Estate agent of [place];Living will Out of facility DNR (pink MOST or yellow form) Living will Out of facility DNR (pink MOST or yellow form) Out of facility DNR (pink MOST or yellow form) Out of facility DNR (pink MOST or yellow form) Out of facility DNR (pink MOST or yellow form)  Does patient want to make changes to medical advance directive? Yes (Inpatient - patient defers changing a medical advance directive at this time - Information given) No - Patient declined Yes (ED - Information included in AVS) No - Patient declined No - Patient declined No - Patient declined No - Patient declined  Copy of Healthcare Power of Attorney in Chart? Yes - validated most recent copy scanned in chart (See row information)        Pre-existing out of facility DNR order (yellow form or pink MOST form)  Yellow form placed in chart (order not valid for inpatient use);Pink MOST form placed in chart (order not valid for inpatient use)  Pink MOST form placed in chart (order not valid for inpatient use);Yellow form placed in chart (order not valid for inpatient use) Yellow form placed in chart (order not valid for inpatient use);Pink MOST form placed in chart (order not  valid for inpatient use) Yellow form placed in chart (order not valid for inpatient use);Pink MOST form placed in chart (order not valid for inpatient use) Yellow form placed in chart (order not valid for inpatient use);Pink MOST form placed in chart (order not valid for inpatient use)    Current Medications (verified) Outpatient Encounter Medications as of 08/24/2023  Medication Sig   aspirin 81 MG tablet Take 81 mg by mouth daily.   denosumab (PROLIA) 60 MG/ML SOSY injection Inject 60 mg into the skin every 6 (six) months.   feeding supplement (BOOST HIGH PROTEIN) LIQD Take 2 Containers by mouth daily.   lisinopril (ZESTRIL) 10 MG tablet TAKE 1 TABLET(10 MG) BY MOUTH DAILY   simvastatin (ZOCOR) 20 MG tablet TAKE 1 TABLET(20 MG) BY MOUTH DAILY   No facility-administered encounter medications on file as of 08/24/2023.    Allergies (verified) Patient has no known allergies.   History: Past Medical History:  Diagnosis Date   History of skin cancer    Dr.John Hall   Hyperlipidemia    Hypertension    Osteoporosis    Past Surgical History:  Procedure Laterality Date   ABDOMINAL HYSTERECTOMY  1998   Family History  Problem Relation Age of Onset   Cancer Father    Dementia Sister    Breast cancer Maternal Grandmother    Breast  cancer Paternal Grandmother    Social History   Socioeconomic History   Marital status: Single    Spouse name: Not on file   Number of children: Not on file   Years of education: Not on file   Highest education level: Not on file  Occupational History   Not on file  Tobacco Use   Smoking status: Former    Current packs/day: 0.00    Types: Cigarettes    Quit date: 12/08/2002    Years since quitting: 20.7   Smokeless tobacco: Never   Tobacco comments:    Quit t age 64   Vaping Use   Vaping status: Never Used  Substance and Sexual Activity   Alcohol use: No    Alcohol/week: 0.0 standard drinks of alcohol   Drug use: No   Sexual activity: Not  Currently  Other Topics Concern   Not on file  Social History Narrative   ** Merged History Encounter **       Social Determinants of Health   Financial Resource Strain: Low Risk  (06/04/2018)   Overall Financial Resource Strain (CARDIA)    Difficulty of Paying Living Expenses: Not hard at all  Food Insecurity: No Food Insecurity (06/04/2018)   Hunger Vital Sign    Worried About Running Out of Food in the Last Year: Never true    Ran Out of Food in the Last Year: Never true  Transportation Needs: No Transportation Needs (06/04/2018)   PRAPARE - Administrator, Civil Service (Medical): No    Lack of Transportation (Non-Medical): No  Physical Activity: Insufficiently Active (06/04/2018)   Exercise Vital Sign    Days of Exercise per Week: 3 days    Minutes of Exercise per Session: 30 min  Stress: No Stress Concern Present (06/04/2018)   [employer] of Occupational Health - Occupational Stress Questionnaire    Feeling of Stress : Not at all  Social Connections: Moderately Isolated (06/04/2018)   Social Connection and Isolation Panel [NHANES]    Frequency of Communication with Friends and Family: Three times a week    Frequency of Social Gatherings with Friends and Family: Three times a week    Attends Religious Services: Never    Active Member of Clubs or Organizations: No    Attends Engineer, structural: Never    Marital Status: Never married    Tobacco Counseling Counseling given: Not Answered Tobacco comments: Quit t age 26    Clinical Intake:  Pre-visit preparation completed: Yes  Pain : No/denies pain     BMI - recorded: 18 Nutritional Status: BMI <19  Underweight Nutritional Risks: Unintentional weight loss  How often do you need to have someone help you when you read instructions, pamphlets, or other written materials from your doctor or pharmacy?: 1 - Never         Activities of Daily Living    08/24/2023    9:27 AM  In your  present state of health, do you have any difficulty performing the following activities:  Hearing? 1  Vision? 0  Difficulty concentrating or making decisions? 0  Walking or climbing stairs? 0  Dressing or bathing? 0  Doing errands, shopping? 1  Comment does not drive  Preparing Food and eating ? N  Comment does not cook, but daughter helps  Using the Toilet? N  In the past six months, have you accidently leaked urine? N  Do you have problems with loss of bowel control? N  Managing your Medications? N  Managing your Finances? Y  Housekeeping or managing your Housekeeping? N    Patient Care Team: [name], NP as PCP - General (Geriatric Medicine)  Indicate any recent Medical Services you may have received from other than [facility] providers in the past year (date may be approximate).     Assessment:   This is a routine wellness examination for [place].  Hearing/Vision screen No results found.   Goals Addressed   None    Depression Screen    08/24/2023    9:08 AM 06/05/2023    9:27 AM 11/03/2022    8:47 AM 08/19/2022    8:48 AM 06/27/2022    8:56 AM 02/10/2022    9:33 AM 11/11/2021    9:05 AM  PHQ 2/9 Scores  PHQ - 2 Score 0 0 0 0 0 0 0    Fall Risk    08/24/2023    9:08 AM 06/05/2023    9:26 AM 11/03/2022    8:47 AM 08/19/2022    8:50 AM 06/27/2022    8:56 AM  Fall Risk   Falls in the past year? 0 1 0 0 0  Number falls in past yr:  0  0 0  Injury with Fall?  0  0 0  Risk for fall due to :  No Fall Risks  No Fall Risks No Fall Risks  Follow up  Falls evaluation completed  Falls evaluation completed Falls evaluation completed    MEDICARE RISK AT HOME:    TIMED UP AND GO:  Was the test performed?  No    Cognitive Function:    08/24/2023    9:20 AM 08/07/2021    9:38 AM 06/14/2019    8:39 AM 06/04/2018    9:23 AM 05/27/2017   10:04 AM  MMSE - Mini Mental State Exam  Orientation to time 4 5 5 5 5   Orientation to Place 5 5 4 5 5   Registration 3 3 3 3 3    Attention/ Calculation 5 2 2 3  0  Recall 1 2 2 2 3   Language- name 2 objects 2 2 2 2 2   Language- repeat 1 1 1 1 1   Language- follow 3 step command 3 3 3 3 3   Language- read & follow direction 1 1 1 1 1   Write a sentence 1 1 1 1 1   Copy design 0 0 0 0 0  Total score 26 25 24 26 24         08/19/2022    8:53 AM 06/14/2020    9:40 AM  6CIT Screen  What Year? 4 points 0 points  What month? 0 points 0 points  What time? 0 points 0 points  Count back from 20 0 points 4 points  Months in reverse 4 points 4 points  Repeat phrase 10 points 10 points  Total Score 18 points 18 points    Immunizations Immunization History  Administered Date(s) Administered   Fluad Quad(high Dose 65+) 10/17/2019, 10/19/2020, 08/07/2021, 11/03/2022   Fluad Trivalent(High Dose 65+) 08/24/2023   Influenza Whole 12/24/2006   Influenza,inj,Quad PF,6+ Mos 01/30/2015, 10/02/2017, 08/06/2018   Influenza-Unspecified 09/18/2011, 08/26/2013, 09/29/2016   PFIZER(Purple Top)SARS-COV-2 Vaccination 01/14/2020, 02/04/2020, 10/06/2020   Pneumococcal Conjugate-13 07/25/2014, 04/24/2015   Pneumococcal Polysaccharide-23 12/08/1996, 10/16/2011   Td 11/05/2005   Tdap 10/16/2011   Zoster, Live 04/24/2015    TDAP status: Due, Education has been provided regarding the importance of this vaccine. Advised may receive this vaccine at  local pharmacy or Health Dept. Aware to provide a copy of the vaccination record if obtained from local pharmacy or Health Dept. Verbalized acceptance and understanding.  Flu Vaccine status: Completed at today's visit  Pneumococcal vaccine status: Up to date  Covid-19 vaccine status: Information provided on how to obtain vaccines.   Qualifies for Shingles Vaccine? Yes   Zostavax completed No   Shingrix Completed?: No.    Education has been provided regarding the importance of this vaccine. Patient has been advised to call insurance company to determine out of pocket expense if they have not yet  received this vaccine. Advised may also receive vaccine at local pharmacy or Health Dept. Verbalized acceptance and understanding.  Screening Tests Health Maintenance  Topic Date Due   Zoster Vaccines- Shingrix (1 of 2) 06/27/1950   DTaP/Tdap/Td (3 - Td or Tdap) 10/15/2021   COVID-19 Vaccine (4 - 2023-24 season) 08/09/2023   DEXA SCAN  12/19/2023   Medicare Annual Wellness (AWV)  08/23/2024   Pneumonia Vaccine 7+ Years old  Completed   INFLUENZA VACCINE  Completed   HPV VACCINES  Aged Out    Health Maintenance  Health Maintenance Due  Topic Date Due   Zoster Vaccines- Shingrix (1 of 2) 06[birth year]27/1950   DTaP/Tdap/Td (3 - Td or Tdap) 10/15/2021   COVID-19 Vaccine (4 - 2023-24 season) 08/09/2023    Colorectal cancer screening: No longer required.   Mammogram status: No longer required due to age.  Bone Density status: Completed 12/18/2021. Results reflect: Bone density results: OSTEOPOROSIS. Repeat every 2 years.  Lung Cancer Screening: (Low Dose CT Chest recommended if Age 56-80 years, 20 pack-year currently smoking OR have quit w/in 15years.) does not qualify.   Lung Cancer Screening Referral: na  Additional Screening:  Hepatitis C Screening: does not qualify; Completed   Vision Screening: Recommended annual ophthalmology exams for early detection of glaucoma and other disorders of the eye. Is the patient up to date with their annual eye exam?  Yes  Who is the provider or what is the name of the office in which the patient attends annual eye exams? [facility]  If pt is not established with a provider, would they like to be referred to a provider to establish care? No .   Dental Screening: Recommended annual dental exams for proper oral hygiene   Community Resource Referral / Chronic Care Management: CRR required this visit?  No   CCM required this visit?  No     Plan:     I have personally reviewed and noted the following in the patient's chart:    Medical and social history Use of alcohol, tobacco or illicit drugs  Current medications and supplements including opioid prescriptions. Patient is not currently taking opioid prescriptions. Functional ability and status Nutritional status Physical activity Advanced directives List of other physicians Hospitalizations, surgeries, and ER visits in previous 12 months Vitals Screenings to include cognitive, depression, and falls Referrals and appointments  In addition, I have reviewed and discussed with patient certain preventive protocols, quality metrics, and best practice recommendations. A written personalized care plan for preventive services as well as general preventive health recommendations were provided to patient.     [name], NP   08/24/2023

## 2023-08-24 NOTE — Patient Instructions (Signed)
  Jasmine Fletcher , Thank you for taking time to come for your Medicare Wellness Visit. I appreciate your ongoing commitment to your health goals. Please review the following plan we discussed and let me know if I can assist you in the future.   These are the goals we discussed:  Goals      Patient Stated     To stay active         This is a list of the screening recommended for you and due dates:  Health Maintenance  Topic Date Due   Zoster (Shingles) Vaccine (1 of 2) 06[birth year]27/1950   DTaP/Tdap/Td vaccine (3 - Td or Tdap) 10/15/2021   COVID-19 Vaccine (4 - 2023-24 season) 08/09/2023   DEXA scan (bone density measurement)  12/19/2023   Medicare Annual Wellness Visit  08/23/2024   Pneumonia Vaccine  Completed   Flu Shot  Completed   HPV Vaccine  Aged Out    Increase calories in diet- 3 meals a day, snacks ADD protein supplement to smallest meal.   To get COVID booster, shingles vaccine and TDAP at the pharmacy

## 2023-08-31 ENCOUNTER — Ambulatory Visit: Payer: 59 | Admitting: *Deleted

## 2023-08-31 DIAGNOSIS — M810 Age-related osteoporosis without current pathological fracture: Secondary | ICD-10-CM | POA: Diagnosis not present

## 2023-08-31 MED ORDER — DENOSUMAB 60 MG/ML ~~LOC~~ SOSY
60.0000 mg | PREFILLED_SYRINGE | Freq: Once | SUBCUTANEOUS | Status: AC
Start: 2023-08-31 — End: 2023-08-31
  Administered 2023-08-31: 60 mg via SUBCUTANEOUS

## 2023-09-16 DIAGNOSIS — H524 Presbyopia: Secondary | ICD-10-CM | POA: Diagnosis not present

## 2023-09-16 DIAGNOSIS — H2513 Age-related nuclear cataract, bilateral: Secondary | ICD-10-CM | POA: Diagnosis not present

## 2023-10-08 ENCOUNTER — Ambulatory Visit: Payer: 59 | Attending: Nurse Practitioner | Admitting: Audiology

## 2023-10-08 DIAGNOSIS — H903 Sensorineural hearing loss, bilateral: Secondary | ICD-10-CM | POA: Insufficient documentation

## 2023-10-08 NOTE — Procedures (Signed)
Outpatient Audiology and [facility] [address] [place], [place]  [address] [phone]  AUDIOLOGICAL  EVALUATION  NAME: Jasmine Fletcher     DOB:   02-Sep-1931      MRN: [id number]                                                                                     DATE: 10/08/2023     REFERENT: [name], NP STATUS: Outpatient DIAGNOSIS: Sensorineural hearing loss, bilateral  History: Jasmine Fletcher was seen for an audiological evaluation due to decreased hearing occurring for many years.  She was accompanied to the appointment by her daughter.  Jasmine Fletcher reports increased difficulty hearing and communicating with her daughter and increased difficulty hearing the television.  She reports that her hearing is worse in the right ear.  Jasmine Fletcher reports a history of bilateral aural fullness and dizziness.  She denies otalgia and tinnitus.  Evaluation:  Otoscopy showed a clear view of the tympanic membranes, bilaterally Tympanometry results were consistent with normal middle ear pressure and normal tympanic membrane mobility, Type A, bilaterally. Audiometric testing was completed using Conventional Audiometry techniques with insert earphones and TDH headphones. Test results are consistent with a mild to severe sensorineural hearing loss, bilaterally.  There is a asymmetry noted at [phone], worse in the left right ear. Speech Recognition Thresholds were obtained at 40 dB HL in the right ear and at 45 dB HL in the left ear. Word Recognition Testing was completed at 80 dB HL and Jasmine Fletcher scored 92% and was completed at 85 dB HL in Jasmine Fletcher scored 96% in the left ear.    Results:  The test results were reviewed with Jasmine Fletcher and her daughter. Test results are consistent with a mild to severe sensorineural hearing loss, bilaterally.  There is a asymmetry noted at [phone], worse in the left right ear.  Jasmine Fletcher will have hearing and communication difficulty in all listening environments.   She will benefit from the use of good communication strategies and the use of amplification.  Jasmine Fletcher was counseled with all the results while she was wearing a pocket talker and reported benefit.  The use of a pocket talker, over-the-counter hearing aids, and traditional hearing aids were reviewed with Jasmine Fletcher and her daughter.  They were counseled to inquire with Jasmine Fletcher's insurance if she has a hearing aid benefit.  Recommendations: 1.   Referral to an ENT for evaluation of asymmetric hearing loss, worse in the right ear. 2.   Communication needs assessment with an audiologist for amplification if interested in pursuing hearing aids.  30 minutes spent testing and counseling on results.   If you have any questions please feel free to contact me at [phone].  Marton Redwood Audiologist, Au.D., CCC-A 10/08/2023  11:53 AM  Cc: [name], NP

## 2023-10-12 ENCOUNTER — Other Ambulatory Visit: Payer: Self-pay

## 2023-10-12 DIAGNOSIS — D472 Monoclonal gammopathy: Secondary | ICD-10-CM

## 2023-10-13 ENCOUNTER — Inpatient Hospital Stay: Payer: 59 | Attending: Hematology

## 2023-10-13 DIAGNOSIS — Z87891 Personal history of nicotine dependence: Secondary | ICD-10-CM | POA: Diagnosis not present

## 2023-10-13 DIAGNOSIS — C9000 Multiple myeloma not having achieved remission: Secondary | ICD-10-CM | POA: Diagnosis not present

## 2023-10-13 DIAGNOSIS — E042 Nontoxic multinodular goiter: Secondary | ICD-10-CM | POA: Diagnosis not present

## 2023-10-13 DIAGNOSIS — R5383 Other fatigue: Secondary | ICD-10-CM | POA: Insufficient documentation

## 2023-10-13 DIAGNOSIS — D649 Anemia, unspecified: Secondary | ICD-10-CM | POA: Diagnosis not present

## 2023-10-13 DIAGNOSIS — D472 Monoclonal gammopathy: Secondary | ICD-10-CM

## 2023-10-13 LAB — CBC WITH DIFFERENTIAL (CANCER CENTER ONLY)
Abs Immature Granulocytes: 0.01 10*3/uL (ref 0.00–0.07)
Basophils Absolute: 0 10*3/uL (ref 0.0–0.1)
Basophils Relative: 1 %
Eosinophils Absolute: 0 10*3/uL (ref 0.0–0.5)
Eosinophils Relative: 0 %
HCT: 30.5 % — ABNORMAL LOW (ref 36.0–46.0)
Hemoglobin: 9.8 g/dL — ABNORMAL LOW (ref 12.0–15.0)
Immature Granulocytes: 0 %
Lymphocytes Relative: 32 %
Lymphs Abs: 1.1 10*3/uL (ref 0.7–4.0)
MCH: 27.6 pg (ref 26.0–34.0)
MCHC: 32.1 g/dL (ref 30.0–36.0)
MCV: 85.9 fL (ref 80.0–100.0)
Monocytes Absolute: 0.4 10*3/uL (ref 0.1–1.0)
Monocytes Relative: 12 %
Neutro Abs: 1.9 10*3/uL (ref 1.7–7.7)
Neutrophils Relative %: 55 %
Platelet Count: 191 10*3/uL (ref 150–400)
RBC: 3.55 MIL/uL — ABNORMAL LOW (ref 3.87–5.11)
RDW: 15.1 % (ref 11.5–15.5)
WBC Count: 3.4 10*3/uL — ABNORMAL LOW (ref 4.0–10.5)
nRBC: 0 % (ref 0.0–0.2)

## 2023-10-13 LAB — CMP (CANCER CENTER ONLY)
ALT: 7 U/L (ref 0–44)
AST: 13 U/L — ABNORMAL LOW (ref 15–41)
Albumin: 3.3 g/dL — ABNORMAL LOW (ref 3.5–5.0)
Alkaline Phosphatase: 41 U/L (ref 38–126)
Anion gap: 2 — ABNORMAL LOW (ref 5–15)
BUN: 16 mg/dL (ref 8–23)
CO2: 27 mmol/L (ref 22–32)
Calcium: 9.3 mg/dL (ref 8.9–10.3)
Chloride: 107 mmol/L (ref 98–111)
Creatinine: 0.86 mg/dL (ref 0.44–1.00)
GFR, Estimated: >60 mL/min (ref >60)
Glucose, Bld: 77 mg/dL (ref 70–99)
Potassium: 4.3 mmol/L (ref 3.5–5.1)
Sodium: 136 mmol/L (ref 135–145)
Total Bilirubin: 0.3 mg/dL (ref <1.2)
Total Protein: 10.6 g/dL — ABNORMAL HIGH (ref 6.5–8.1)

## 2023-10-16 DIAGNOSIS — H25811 Combined forms of age-related cataract, right eye: Secondary | ICD-10-CM | POA: Diagnosis not present

## 2023-10-16 DIAGNOSIS — H25812 Combined forms of age-related cataract, left eye: Secondary | ICD-10-CM | POA: Diagnosis not present

## 2023-10-16 DIAGNOSIS — Z01818 Encounter for other preprocedural examination: Secondary | ICD-10-CM | POA: Diagnosis not present

## 2023-10-16 LAB — MULTIPLE MYELOMA PANEL, SERUM
Albumin SerPl Elph-Mcnc: 3.9 g/dL (ref 2.9–4.4)
Albumin/Glob SerPl: 0.7 (ref 0.7–1.7)
Alpha 1: 0.3 g/dL (ref 0.0–0.4)
Alpha2 Glob SerPl Elph-Mcnc: 0.7 g/dL (ref 0.4–1.0)
B-Globulin SerPl Elph-Mcnc: 0.8 g/dL (ref 0.7–1.3)
Gamma Glob SerPl Elph-Mcnc: 4.3 g/dL — ABNORMAL HIGH (ref 0.4–1.8)
Globulin, Total: 6 g/dL — ABNORMAL HIGH (ref 2.2–3.9)
IgA: 48 mg/dL — ABNORMAL LOW (ref 64–422)
IgG (Immunoglobin G), Serum: 6012 mg/dL — ABNORMAL HIGH (ref 586–1602)
IgM (Immunoglobulin M), Srm: 10 mg/dL — ABNORMAL LOW (ref 26–217)
M Protein SerPl Elph-Mcnc: 3.8 g/dL — ABNORMAL HIGH
Total Protein ELP: 9.9 g/dL — ABNORMAL HIGH (ref 6.0–8.5)

## 2023-10-26 ENCOUNTER — Telehealth: Payer: 59 | Admitting: Hematology

## 2023-11-02 ENCOUNTER — Inpatient Hospital Stay (HOSPITAL_BASED_OUTPATIENT_CLINIC_OR_DEPARTMENT_OTHER): Payer: 59 | Admitting: Hematology

## 2023-11-02 DIAGNOSIS — D472 Monoclonal gammopathy: Secondary | ICD-10-CM

## 2023-11-02 DIAGNOSIS — D649 Anemia, unspecified: Secondary | ICD-10-CM | POA: Diagnosis not present

## 2023-11-02 DIAGNOSIS — C9000 Multiple myeloma not having achieved remission: Secondary | ICD-10-CM | POA: Diagnosis not present

## 2023-11-02 DIAGNOSIS — R5383 Other fatigue: Secondary | ICD-10-CM | POA: Diagnosis not present

## 2023-11-02 DIAGNOSIS — E042 Nontoxic multinodular goiter: Secondary | ICD-10-CM | POA: Diagnosis not present

## 2023-11-02 DIAGNOSIS — Z87891 Personal history of nicotine dependence: Secondary | ICD-10-CM | POA: Diagnosis not present

## 2023-11-02 NOTE — Progress Notes (Signed)
HEMATOLOGY/ONCOLOGY PHONE VISIT NOTE  Date of Service: 11/02/23     Patient Care Team: [name], NP as PCP - General (Geriatric Medicine)   CHIEF COMPLAINTS:  Follow-up for continued evaluation and management of smoldering myeloma   HISTORY OF PRESENTING ILLNESS:   Jasmine Fletcher is a wonderful 89 y.o. female who has been referred to [place] by [name], NP for evaluation and management of elevated serum globulin levels. The pt reports that she is doing well overall.  The pt reports that her appetite has decreased recently but that she eats a healthy diet. She notes mild fatigue. Denies new bone pain, back pain, hip pain, infection concerns, and any other new concerns.  She has been getting a Prolia shot every 6 months for the past 2 years. She has also been taking Aricept but notes that her memory is worsening. Her daughter is her health care POA, but the pt makes her own healthcare decisions at this time.  Most recent lab results (06/14/2019) of CBC is as follows: all values are WNL except for HGB at 11.4, MCH at 26.8. 06/21/2019 UPEP revealed protein/creat ratio at 209, total protein at 29, and two abnormal protein bands 06/14/2019 SPEP revealed total protein at 10.1, Alpha 2 at 1.0, gamma globulin at 4.1, abnormal protein band1 at 3.9   INTERVAL HISTORY:  .Marland KitchenI connected with Amedeo Plenty on 07/22/2023 at  8:40 AM EST by telephone visit and verified that I am speaking with the correct person using two identifiers.   I last connected with the patient via phone on 07/22/2023 and she was doing well overall.   Jasmine Fletcher is a 89 y.o. female, who is called for continued evaluation and management of her smoldering myeloma.  Patient notes she has been doing well overall since our last visit. She denies any new infection issues, fever, chills, night sweats, unexpected weight loss, bone pain, joint pain, back pain, chest pain, or leg swelling. She does  complain of chronic fatigue.   I discussed the limitations, risks, security and privacy concerns of performing an evaluation and management service by telemedicine and the availability of in-person appointments. I also discussed with the patient that there may be a patient responsible charge related to this service. The patient expressed understanding and agreed to proceed.   Other persons participating in the visit and their role in the encounter: Patient's daughter  Patient's location: Home Provider's location: [facility]   Chief Complaint: Follow-up for multiple myeloma  MEDICAL HISTORY:  Past Medical History:  Diagnosis Date   History of skin cancer    Dr.John Hall   Hyperlipidemia    Hypertension    Osteoporosis     SURGICAL HISTORY: Past Surgical History:  Procedure Laterality Date   ABDOMINAL HYSTERECTOMY  1998    SOCIAL HISTORY: Social History   Socioeconomic History   Marital status: Single    Spouse name: Not on file   Number of children: Not on file   Years of education: Not on file   Highest education level: Not on file  Occupational History   Not on file  Tobacco Use   Smoking status: Former    Current packs/day: 0.00    Types: Cigarettes    Quit date: 12/08/2002    Years since quitting: 20.9   Smokeless tobacco: Never   Tobacco comments:    Quit t age 57   Vaping Use   Vaping status: Never Used  Substance and Sexual  Activity   Alcohol use: No    Alcohol/week: 0.0 standard drinks of alcohol   Drug use: No   Sexual activity: Not Currently  Other Topics Concern   Not on file  Social History Narrative   ** Merged History Encounter **       Social Determinants of Health   Financial Resource Strain: Low Risk  (06/04/2018)   Overall Financial Resource Strain (CARDIA)    Difficulty of Paying Living Expenses: Not hard at all  Food Insecurity: No Food Insecurity (06/04/2018)   Hunger Vital Sign    Worried About Running Out of Food in the Last Year: Never  true    Ran Out of Food in the Last Year: Never true  Transportation Needs: No Transportation Needs (06/04/2018)   PRAPARE - Administrator, Civil Service (Medical): No    Lack of Transportation (Non-Medical): No  Physical Activity: Insufficiently Active (06/04/2018)   Exercise Vital Sign    Days of Exercise per Week: 3 days    Minutes of Exercise per Session: 30 min  Stress: No Stress Concern Present (06/04/2018)   [employer] of Occupational Health - Occupational Stress Questionnaire    Feeling of Stress : Not at all  Social Connections: Moderately Isolated (06/04/2018)   Social Connection and Isolation Panel [NHANES]    Frequency of Communication with Friends and Family: Three times a week    Frequency of Social Gatherings with Friends and Family: Three times a week    Attends Religious Services: Never    Active Member of Clubs or Organizations: No    Attends Banker Meetings: Never    Marital Status: Never married  Intimate Partner Violence: Not At Risk (06/04/2018)   Humiliation, Afraid, Rape, and Kick questionnaire    Fear of Current or Ex-Partner: No    Emotionally Abused: No    Physically Abused: No    Sexually Abused: No    FAMILY HISTORY: Family History  Problem Relation Age of Onset   Cancer Father    Dementia Sister    Breast cancer Maternal Grandmother    Breast cancer Paternal Grandmother     ALLERGIES:  has No Known Allergies.  MEDICATIONS:  Current Outpatient Medications  Medication Sig Dispense Refill   aspirin 81 MG tablet Take 81 mg by mouth daily.     denosumab (PROLIA) 60 MG/ML SOSY injection Inject 60 mg into the skin every 6 (six) months. 60 mL 0   feeding supplement (BOOST HIGH PROTEIN) LIQD Take 2 Containers by mouth daily.     lisinopril (ZESTRIL) 10 MG tablet TAKE 1 TABLET(10 MG) BY MOUTH DAILY 90 tablet 3   simvastatin (ZOCOR) 20 MG tablet TAKE 1 TABLET(20 MG) BY MOUTH DAILY 90 tablet 1   No current  facility-administered medications for this visit.    REVIEW OF SYSTEMS:    10 Point review of Systems was done is negative except as noted above.   PHYSICAL EXAMINATION: Telemedicine visit  LABORATORY DATA:  I have reviewed the data as listed  .    Latest Ref Rng & Units 10/13/2023    9:29 AM 07/14/2023    9:17 AM 04/03/2023    8:52 AM  CBC  WBC 4.0 - 10.5 K/uL 3.4  3.3  4.1   Hemoglobin 12.0 - 15.0 g/dL 9.8  40.9  9.6   Hematocrit 36.0 - 46.0 % 30.5  31.9  31.3   Platelets 150 - 400 K/uL 191  183  185     .  Latest Ref Rng & Units 10/13/2023    9:29 AM 07/14/2023    9:17 AM 04/03/2023    8:52 AM  CMP  Glucose 70 - 99 mg/dL 77  85  80   BUN 8 - 23 mg/dL 16  24  19    Creatinine 0.44 - 1.00 mg/dL 1.61  0.96  0.45   Sodium 135 - 145 mmol/L 136  137  139   Potassium 3.5 - 5.1 mmol/L 4.3  4.0  4.0   Chloride 98 - 111 mmol/L 107  106  108   CO2 22 - 32 mmol/L 27  28  28    Calcium 8.9 - 10.3 mg/dL 9.3  40.9  9.7   Total Protein 6.5 - 8.1 g/dL 81.1  91.4  78.2   Total Bilirubin <1.2 mg/dL 0.3  0.3  0.3   Alkaline Phos 38 - 126 U/L 41  39  47   AST 15 - 41 U/L 13  14  14    ALT 0 - 44 U/L 7  7  6      09/02/2019 Bone Marrow Biopsy ([id number])   08/05/2019 Bone Marrow Biopsy ([id number])        RADIOGRAPHIC STUDIES: I have personally reviewed the radiological images as listed and agreed with the findings in the report. No results found.   ASSESSMENT & PLAN:  NESA SZOSTEK is a 89 y.o. female with:  #1 IgG Kappa Monoclonal paraproteinemia with M protein of 3.9 -highly concerning for MM  06/14/2019 SPEP revealed total protein at 10.1, alpha 2 at 1.0, gamma globulin at 4.1, Abnormal protein band1 at 3.9 UPEP +ve for Bence Jones protein  08/08/2019 PET scan with results revealing "Two right thyroid nodules are both highly hypermetabolic. A significant minority of hypermetabolic thyroid lesions represent thyroid malignancy. Further workup with thyroid ultrasound  is recommended. A small left upper para-aortic lymph node is mildly hypermetabolic with maximum SUV 4.7, significance uncertain. No significant abnormal accentuated osseous activity or compelling focal findings of active multiple myeloma in the skeleton. Hypodense lesions in the liver are photopenic and likely benign Hyperdense small lesions of the left kidney upper pole are too small to characterize but statistically most likely to be complex cysts. If the patient has hematuria or if otherwise clinically warranted, renal protocol MRI with and without contrast could be utilized for further characterization. Mild mid to distal accentuated thoracic esophageal activity with maximum SUV of 4.8; mildly accentuated activity in the gastric cardia/gastroesophageal junction with maximum SUV 5.8. Both could be physiologic but rarely low-grade malignancy could have a similar appearance, upper endoscopy could be utilized for further workup if clinically warranted. Other imaging findings of potential clinical significance: Chronic ischemic microvascular white matter disease intracranially. Aortic Atherosclerosis (ICD10-I70.0). Mild cardiomegaly. Biapical pleuroparenchymal scarring and probable scarring in the superior segment left lower lobe. Calcified uterine fibroids. Thoracolumbar scoliosis. No clear bone lesions."  08/05/2019 bone marrow biopsy ([id number]) with results revealing "no monoclonal b cell population or abnormal T cell phenotype identified. Atypical lymphoplasmacytoid aggregates - not clear for myeloma  09/02/2019 bone marrow biopsy ([id number]) with results revealing small popoulation of monoclonal plasma cell. Does not appear to co-related with M protein levels and seem to be underrepresented.  #2. FDG avid thyroid nodules  #3.  Patient Active Problem List   Diagnosis Date Noted   Multiple myeloma not having achieved remission (HCC) 02/10/2022   Depression, major, single episode, complete  remission (HCC) 02/13/2020   Smoldering myeloma 10/17/2019   Vitamin D deficiency  10/17/2019   Dementia without behavioral disturbance (HCC) 07/01/2017   Mixed hyperlipidemia 11/29/2013   Osteoporosis 03/15/2013   BRADYCARDIA 02/17/2009   HYPERTENSION, BENIGN ESSENTIAL 02/16/2009   ANEMIA, HYPOCHROMIC 07/26/2007   DISORDER, TOBACCO USE 07/26/2007   DEPRESSION 07/26/2007   CARDIAC CATHETERIZATION, LEFT, HX OF 07/26/2007    PLAN:  -Discussed lab results from 10/13/2023 in detail with the patient. CBC shows slightly decreased WBC of 3.4 K, decreased hemoglobin of 9.8 g/dL with hematocrit of 11.9%. CMP shows elevated total protein level of 10.6 and decreased AST level of 13.  -mild anemia.  -Discussed multiple myeloma panel results from 10/13/2023 in detail with the patient. Showed M-Protein of 3.8. (previously 4) -Answered all of patient's questions.  -patient prefers to continue to take a conservative approach with monitoring labs at this time  FOLLOW UP: Phone visit with Dr Candise Che in 4 months Labs 2 weeks prior to phone visit  The total time spent in the appointment was 20 minutes* .  All of the patient's questions were answered with apparent satisfaction. The patient knows to call the clinic with any problems, questions or concerns.   Wyvonnia Lora MD MS AAHIVMS [facility] Hematology/Oncology Physician [facility]  .*Total Encounter Time as defined by the Centers for Medicare and Medicaid Services includes, in addition to the face-to-face time of a patient visit (documented in the note above) non-face-to-face time: obtaining and reviewing outside history, ordering and reviewing medications, tests or procedures, care coordination (communications with other health care professionals or caregivers) and documentation in the medical record.   I,Param Shah,acting as a Neurosurgeon for Wyvonnia Lora, MD.,have documented all relevant documentation on the behalf of Wyvonnia Lora, MD,as  directed by  Wyvonnia Lora, MD while in the presence of Wyvonnia Lora, MD.   .I have reviewed the above documentation for accuracy and completeness, and I agree with the above. Johney Maine MD

## 2023-11-09 DIAGNOSIS — H2513 Age-related nuclear cataract, bilateral: Secondary | ICD-10-CM | POA: Diagnosis not present

## 2023-11-09 DIAGNOSIS — I10 Essential (primary) hypertension: Secondary | ICD-10-CM | POA: Diagnosis not present

## 2023-11-09 DIAGNOSIS — H25812 Combined forms of age-related cataract, left eye: Secondary | ICD-10-CM | POA: Diagnosis not present

## 2023-11-11 ENCOUNTER — Other Ambulatory Visit: Payer: Self-pay | Admitting: Nurse Practitioner

## 2023-11-11 DIAGNOSIS — E782 Mixed hyperlipidemia: Secondary | ICD-10-CM

## 2023-11-17 ENCOUNTER — Other Ambulatory Visit: Payer: Self-pay | Admitting: Nurse Practitioner

## 2023-11-17 DIAGNOSIS — I10 Essential (primary) hypertension: Secondary | ICD-10-CM

## 2023-11-23 ENCOUNTER — Ambulatory Visit: Payer: 59 | Admitting: Nurse Practitioner

## 2023-11-23 DIAGNOSIS — H25811 Combined forms of age-related cataract, right eye: Secondary | ICD-10-CM | POA: Diagnosis not present

## 2023-11-23 DIAGNOSIS — I10 Essential (primary) hypertension: Secondary | ICD-10-CM | POA: Diagnosis not present

## 2023-11-23 DIAGNOSIS — H2513 Age-related nuclear cataract, bilateral: Secondary | ICD-10-CM | POA: Diagnosis not present

## 2023-11-24 ENCOUNTER — Telehealth: Payer: Self-pay | Admitting: Hematology

## 2023-11-24 NOTE — Telephone Encounter (Signed)
Spoke with patient daughter confirming upcoming appointment 

## 2023-12-15 DIAGNOSIS — H524 Presbyopia: Secondary | ICD-10-CM | POA: Diagnosis not present

## 2023-12-18 ENCOUNTER — Ambulatory Visit: Payer: 59 | Admitting: Nurse Practitioner

## 2024-01-01 ENCOUNTER — Ambulatory Visit (INDEPENDENT_AMBULATORY_CARE_PROVIDER_SITE_OTHER): Payer: 59 | Admitting: Nurse Practitioner

## 2024-01-01 ENCOUNTER — Encounter: Payer: Self-pay | Admitting: Nurse Practitioner

## 2024-01-01 VITALS — BP 130/78 | HR 60 | Temp 97.7°F | Resp 16 | Ht 69.0 in | Wt 121.2 lb

## 2024-01-01 DIAGNOSIS — I10 Essential (primary) hypertension: Secondary | ICD-10-CM | POA: Diagnosis not present

## 2024-01-01 DIAGNOSIS — E782 Mixed hyperlipidemia: Secondary | ICD-10-CM

## 2024-01-01 DIAGNOSIS — E559 Vitamin D deficiency, unspecified: Secondary | ICD-10-CM | POA: Diagnosis not present

## 2024-01-01 DIAGNOSIS — M810 Age-related osteoporosis without current pathological fracture: Secondary | ICD-10-CM | POA: Diagnosis not present

## 2024-01-01 DIAGNOSIS — D472 Monoclonal gammopathy: Secondary | ICD-10-CM | POA: Diagnosis not present

## 2024-01-01 NOTE — Progress Notes (Signed)
Careteam: Patient Care Team: Jasmine Seller, NP as PCP - General (Geriatric Medicine)  PLACE OF SERVICE:  [facility]  Advanced Directive information Does Patient Have a Medical Advance Directive?: Yes, Type of Advance Directive: Healthcare Power of Jasmine Fletcher;Living will;Out of facility DNR (pink MOST or yellow form), Pre-existing out of facility DNR order (yellow form or pink MOST form): Yellow form placed in chart (order not valid for inpatient use), Does patient want to make changes to medical advance directive?: No - Patient declined  No Known Allergies  Chief Complaint  Patient presents with   Medical Management of Chronic Issues    6 month follow up and discuss dexa scan ,tdap,shingles,and covid vaccines.     HPI: Patient is a 89 y.o. female for routine follow up.   Followed by Dr Jasmine Fletcher for myeloma  Reports she eats one meal and then will snack a lot.  She likes a good vegetable plate.   She gets prolia from pharmacy and comes here for injections Has been taking on vit d due to elevated level.   Doing well overwell.  No falls Has a little headache sometimes in the morning but overall no headaches No skin issues or wounds that dont heal Saw dermatology in the past for skin cancer, has not seen recently because everytime she goes back he freezes areas which were very painful.    Review of Systems:  Review of Systems  Constitutional:  Negative for chills, fever and weight loss.  HENT:  Negative for tinnitus.   Respiratory:  Negative for cough, sputum production and shortness of breath.   Cardiovascular:  Negative for chest pain, palpitations and leg swelling.  Gastrointestinal:  Negative for abdominal pain, constipation, diarrhea and heartburn.  Genitourinary:  Negative for dysuria, frequency and urgency.  Musculoskeletal:  Negative for back pain, falls, joint pain and myalgias.  Skin: Negative.   Neurological:  Negative for dizziness and headaches.   Psychiatric/Behavioral:  Negative for depression and memory loss. The patient does not have insomnia.     Past Medical History:  Diagnosis Date   History of skin cancer    Dr.John Fletcher   Hyperlipidemia    Hypertension    Osteoporosis    Past Surgical History:  Procedure Laterality Date   ABDOMINAL HYSTERECTOMY  1998   Social History:   reports that she quit smoking about 21 years ago. Her smoking use included cigarettes. She has never used smokeless tobacco. She reports that she does not drink alcohol and does not use drugs.  Family History  Problem Relation Age of Onset   Cancer Father    Dementia Sister    Breast cancer Maternal Grandmother    Breast cancer Paternal Grandmother     Medications: Patient's Medications  New Prescriptions   No medications on file  Previous Medications   ASPIRIN 81 MG TABLET    Take 81 mg by mouth daily.   DENOSUMAB (PROLIA) 60 MG/ML SOSY INJECTION    Inject 60 mg into the skin every 6 (six) months.   FEEDING SUPPLEMENT (BOOST HIGH PROTEIN) LIQD    Take 2 Containers by mouth daily.   LISINOPRIL (ZESTRIL) 10 MG TABLET    TAKE 1 TABLET(10 MG) BY MOUTH DAILY   SIMVASTATIN (ZOCOR) 20 MG TABLET    TAKE 1 TABLET(20 MG) BY MOUTH DAILY  Modified Medications   No medications on file  Discontinued Medications   No medications on file    Physical Exam:  Vitals:  01/01/24 0938  BP: 130/78  Pulse: 60  Resp: 16  Temp: 97.7 F (36.5 C)  SpO2: 98%  Weight: 121 lb 3.2 oz (55 kg)  Height: 5\' 9"  (1.753 m)   Body mass index is 17.9 kg/m. Wt Readings from Last 3 Encounters:  01/01/24 121 lb 3.2 oz (55 kg)  08/24/23 118 lb (53.5 kg)  06/05/23 122 lb (55.3 kg)    Physical Exam Constitutional:      General: She is not in acute distress.    Appearance: She is well-developed. She is not diaphoretic.  HENT:     Head: Normocephalic and atraumatic.     Mouth/Throat:     Pharynx: No oropharyngeal exudate.  Eyes:     Conjunctiva/sclera:  Conjunctivae normal.     Pupils: Pupils are equal, round, and reactive to light.  Cardiovascular:     Rate and Rhythm: Normal rate and regular rhythm.     Heart sounds: Normal heart sounds.  Pulmonary:     Effort: Pulmonary effort is normal.     Breath sounds: Normal breath sounds.  Abdominal:     General: Bowel sounds are normal.     Palpations: Abdomen is soft.  Musculoskeletal:     Cervical back: Normal range of motion and neck supple.     Right lower leg: No edema.     Left lower leg: No edema.  Skin:    General: Skin is warm and dry.  Neurological:     Mental Status: She is alert.  Psychiatric:        Mood and Affect: Mood normal.     Labs reviewed: Basic Metabolic Panel: Recent Labs    04/03/23 0852 07/14/23 0917 10/13/23 0929  NA 139 137 136  K 4.0 4.0 4.3  CL 108 106 107  CO2 28 28 27   GLUCOSE 80 85 77  BUN 19 24* 16  CREATININE 0.89 0.97 0.86  CALCIUM 9.7 10.2 9.3   Liver Function Tests: Recent Labs    04/03/23 0852 07/14/23 0917 10/13/23 0929  AST 14* 14* 13*  ALT 6 7 7   ALKPHOS 47 39 41  BILITOT 0.3 0.3 0.3  PROT 10.0* 10.3* 10.6*  ALBUMIN 3.4* 3.3* 3.3*   No results for input(s): "LIPASE", "AMYLASE" in the last 8760 hours. No results for input(s): "AMMONIA" in the last 8760 hours. CBC: Recent Labs    04/03/23 0852 07/14/23 0917 10/13/23 0929  WBC 4.1 3.3* 3.4*  NEUTROABS 2.1 1.8 1.9  HGB 9.6* 10.0* 9.8*  HCT 31.3* 31.9* 30.5*  MCV 88.2 85.5 85.9  PLT 185 183 191   Lipid Panel: No results for input(s): "CHOL", "HDL", "LDLCALC", "TRIG", "CHOLHDL", "LDLDIRECT" in the last 8760 hours. TSH: No results for input(s): "TSH" in the last 8760 hours. A1C: No results found for: "HGBA1C"   Assessment/Plan 1. Osteoporosis, unspecified osteoporosis type, unspecified pathological fracture presence (Primary) -continues on prolia, last injection 08/31/23 so she will be due in March - DG BONE DENSITY (DXA); Future  2. Smoldering  myeloma Followed by oncology   3. Mixed hyperlipidemia Continues on zocor - Lipid panel  4. Essential hypertension, benign -Blood pressure well controlled, goal bp <140/90 Continue current medications and dietary modifications follow metabolic panel  5. Vitamin D deficiency Off supplements at this time due to elevated level last year - Vitamin D, 25-hydroxy   Return in about 6 months (around 06/30/2024) for routine follow up, labs at appt .  Jasmine Fletcher. Biagio Borg [facility] [phone]

## 2024-01-02 LAB — LIPID PANEL
Cholesterol: 127 mg/dL (ref <200)
HDL: 49 mg/dL — ABNORMAL LOW (ref >=50)
LDL Cholesterol (Calc): 61 mg/dL
Non-HDL Cholesterol (Calc): 78 mg/dL (ref <130)
Total CHOL/HDL Ratio: 2.6 (calc) (ref <5.0)
Triglycerides: 87 mg/dL (ref <150)

## 2024-01-02 LAB — VITAMIN D 25 HYDROXY (VIT D DEFICIENCY, FRACTURES): Vit D, 25-Hydroxy: 53 ng/mL (ref 30–100)

## 2024-01-28 ENCOUNTER — Other Ambulatory Visit: Payer: Self-pay | Admitting: *Deleted

## 2024-01-28 MED ORDER — DENOSUMAB 60 MG/ML ~~LOC~~ SOSY: 60.0000 mg | PREFILLED_SYRINGE | SUBCUTANEOUS | 0 refills | Status: DC

## 2024-01-28 NOTE — Telephone Encounter (Signed)
 Patient is due for Prolia in March. Rx sent to pharmacy.

## 2024-02-02 ENCOUNTER — Other Ambulatory Visit: Payer: Self-pay | Admitting: *Deleted

## 2024-02-02 DIAGNOSIS — M810 Age-related osteoporosis without current pathological fracture: Secondary | ICD-10-CM

## 2024-02-02 MED ORDER — DENOSUMAB 60 MG/ML ~~LOC~~ SOSY
60.0000 mg | PREFILLED_SYRINGE | Freq: Once | SUBCUTANEOUS | Status: AC
  Administered 2024-02-23: 60 mg via SUBCUTANEOUS

## 2024-02-15 ENCOUNTER — Ambulatory Visit: Admitting: Nurse Practitioner

## 2024-02-15 ENCOUNTER — Other Ambulatory Visit: Payer: Self-pay | Admitting: *Deleted

## 2024-02-15 DIAGNOSIS — E782 Mixed hyperlipidemia: Secondary | ICD-10-CM

## 2024-02-15 DIAGNOSIS — M810 Age-related osteoporosis without current pathological fracture: Secondary | ICD-10-CM

## 2024-02-15 DIAGNOSIS — I10 Essential (primary) hypertension: Secondary | ICD-10-CM

## 2024-02-15 MED ORDER — SIMVASTATIN 20 MG PO TABS: 20.0000 mg | ORAL_TABLET | Freq: Every day | ORAL | 1 refills | Status: DC

## 2024-02-15 MED ORDER — LISINOPRIL 10 MG PO TABS: ORAL_TABLET | ORAL | 1 refills | Status: DC

## 2024-02-16 LAB — COMPLETE METABOLIC PANEL WITH GFR
AG Ratio: 0.4 (calc) — ABNORMAL LOW (ref 1.0–2.5)
ALT: 6 U/L (ref 6–29)
AST: 14 U/L (ref 10–35)
Albumin: 3.3 g/dL — ABNORMAL LOW (ref 3.6–5.1)
Alkaline phosphatase (APISO): 42 U/L (ref 37–153)
BUN: 18 mg/dL (ref 7–25)
CO2: 30 mmol/L (ref 20–32)
Calcium: 10.5 mg/dL — ABNORMAL HIGH (ref 8.6–10.4)
Chloride: 101 mmol/L (ref 98–110)
Creat: 0.94 mg/dL (ref 0.60–0.95)
Globulin: 8.2 g/dL — ABNORMAL HIGH (ref 1.9–3.7)
Glucose, Bld: 69 mg/dL (ref 65–139)
Potassium: 4.2 mmol/L (ref 3.5–5.3)
Sodium: 134 mmol/L — ABNORMAL LOW (ref 135–146)
Total Bilirubin: 0.2 mg/dL (ref 0.2–1.2)
Total Protein: 11.5 g/dL — ABNORMAL HIGH (ref 6.1–8.1)
eGFR: 58 mL/min/{1.73_m2} — ABNORMAL LOW (ref >=60)

## 2024-02-23 ENCOUNTER — Ambulatory Visit

## 2024-02-23 DIAGNOSIS — M810 Age-related osteoporosis without current pathological fracture: Secondary | ICD-10-CM | POA: Diagnosis not present

## 2024-02-23 MED ORDER — DENOSUMAB 60 MG/ML ~~LOC~~ SOSY
60.0000 mg | PREFILLED_SYRINGE | SUBCUTANEOUS | Status: AC
Start: 2024-08-29 — End: 2025-02-24

## 2024-02-23 NOTE — Progress Notes (Signed)
 Patient is in office today for a nurse visit for  prolia injection . Patient Injection was given in the  Left deltoid. Patient tolerated injection well.

## 2024-03-11 ENCOUNTER — Other Ambulatory Visit: Payer: Self-pay

## 2024-03-11 DIAGNOSIS — D472 Monoclonal gammopathy: Secondary | ICD-10-CM

## 2024-03-14 ENCOUNTER — Inpatient Hospital Stay: Payer: 59 | Attending: Hematology

## 2024-03-14 DIAGNOSIS — R5383 Other fatigue: Secondary | ICD-10-CM | POA: Insufficient documentation

## 2024-03-14 DIAGNOSIS — D649 Anemia, unspecified: Secondary | ICD-10-CM | POA: Insufficient documentation

## 2024-03-14 DIAGNOSIS — Z87891 Personal history of nicotine dependence: Secondary | ICD-10-CM | POA: Insufficient documentation

## 2024-03-14 DIAGNOSIS — D472 Monoclonal gammopathy: Secondary | ICD-10-CM | POA: Diagnosis not present

## 2024-03-14 LAB — CBC WITH DIFFERENTIAL (CANCER CENTER ONLY)
Abs Immature Granulocytes: 0.01 10*3/uL (ref 0.00–0.07)
Basophils Absolute: 0 10*3/uL (ref 0.0–0.1)
Basophils Relative: 1 %
Eosinophils Absolute: 0 10*3/uL (ref 0.0–0.5)
Eosinophils Relative: 0 %
HCT: 31.7 % — ABNORMAL LOW (ref 36.0–46.0)
Hemoglobin: 9.9 g/dL — ABNORMAL LOW (ref 12.0–15.0)
Immature Granulocytes: 0 %
Lymphocytes Relative: 32 %
Lymphs Abs: 1.1 10*3/uL (ref 0.7–4.0)
MCH: 26.7 pg (ref 26.0–34.0)
MCHC: 31.2 g/dL (ref 30.0–36.0)
MCV: 85.4 fL (ref 80.0–100.0)
Monocytes Absolute: 0.5 10*3/uL (ref 0.1–1.0)
Monocytes Relative: 13 %
Neutro Abs: 1.9 10*3/uL (ref 1.7–7.7)
Neutrophils Relative %: 54 %
Platelet Count: 207 10*3/uL (ref 150–400)
RBC: 3.71 MIL/uL — ABNORMAL LOW (ref 3.87–5.11)
RDW: 14.4 % (ref 11.5–15.5)
WBC Count: 3.6 10*3/uL — ABNORMAL LOW (ref 4.0–10.5)
nRBC: 0 % (ref 0.0–0.2)

## 2024-03-14 LAB — CMP (CANCER CENTER ONLY)
ALT: 6 U/L (ref 0–44)
AST: 13 U/L — ABNORMAL LOW (ref 15–41)
Albumin: 3.2 g/dL — ABNORMAL LOW (ref 3.5–5.0)
Alkaline Phosphatase: 45 U/L (ref 38–126)
Anion gap: 2 — ABNORMAL LOW (ref 5–15)
BUN: 18 mg/dL (ref 8–23)
CO2: 28 mmol/L (ref 22–32)
Calcium: 9.6 mg/dL (ref 8.9–10.3)
Chloride: 105 mmol/L (ref 98–111)
Creatinine: 0.92 mg/dL (ref 0.44–1.00)
GFR, Estimated: 59 mL/min — ABNORMAL LOW (ref >60)
Glucose, Bld: 84 mg/dL (ref 70–99)
Potassium: 4.4 mmol/L (ref 3.5–5.1)
Sodium: 135 mmol/L (ref 135–145)
Total Bilirubin: 0.3 mg/dL (ref 0.0–1.2)
Total Protein: 11.1 g/dL — ABNORMAL HIGH (ref 6.5–8.1)

## 2024-03-16 LAB — MULTIPLE MYELOMA PANEL, SERUM
Albumin SerPl Elph-Mcnc: 3.7 g/dL (ref 2.9–4.4)
Albumin/Glob SerPl: 0.6 — ABNORMAL LOW (ref 0.7–1.7)
Alpha 1: 0.4 g/dL (ref 0.0–0.4)
Alpha2 Glob SerPl Elph-Mcnc: 0.8 g/dL (ref 0.4–1.0)
B-Globulin SerPl Elph-Mcnc: 1 g/dL (ref 0.7–1.3)
Gamma Glob SerPl Elph-Mcnc: 4.3 g/dL — ABNORMAL HIGH (ref 0.4–1.8)
Globulin, Total: 6.5 g/dL — ABNORMAL HIGH (ref 2.2–3.9)
IgA: 52 mg/dL — ABNORMAL LOW (ref 64–422)
IgG (Immunoglobin G), Serum: 6903 mg/dL — ABNORMAL HIGH (ref 586–1602)
IgM (Immunoglobulin M), Srm: 11 mg/dL — ABNORMAL LOW (ref 26–217)
M Protein SerPl Elph-Mcnc: 3.9 g/dL — ABNORMAL HIGH
Total Protein ELP: 10.2 g/dL — ABNORMAL HIGH (ref 6.0–8.5)

## 2024-03-28 ENCOUNTER — Inpatient Hospital Stay (HOSPITAL_BASED_OUTPATIENT_CLINIC_OR_DEPARTMENT_OTHER): Payer: 59 | Admitting: Hematology

## 2024-03-28 DIAGNOSIS — D649 Anemia, unspecified: Secondary | ICD-10-CM | POA: Diagnosis not present

## 2024-03-28 DIAGNOSIS — D472 Monoclonal gammopathy: Secondary | ICD-10-CM | POA: Diagnosis not present

## 2024-03-28 DIAGNOSIS — R5383 Other fatigue: Secondary | ICD-10-CM | POA: Diagnosis not present

## 2024-03-28 DIAGNOSIS — Z87891 Personal history of nicotine dependence: Secondary | ICD-10-CM | POA: Diagnosis not present

## 2024-03-28 NOTE — Progress Notes (Signed)
 HEMATOLOGY/ONCOLOGY PHONE VISIT NOTE  Date of Service: 03/28/24     Patient Care Team: Verma Gobble, NP as PCP - General (Geriatric Medicine)   CHIEF COMPLAINTS:  Follow-up for continued evaluation and management of smoldering myeloma   HISTORY OF PRESENTING ILLNESS:   MY RINKE is a wonderful 89 y.o. female who has been referred to us  by Verma Gobble, NP for evaluation and management of elevated serum globulin levels. The pt reports that she is doing well overall.  The pt reports that her appetite has decreased recently but that she eats a healthy diet. She notes mild fatigue. Denies new bone pain, back pain, hip pain, infection concerns, and any other new concerns.  She has been getting a Prolia  shot every 6 months for the past 2 years. She has also been taking Aricept  but notes that her memory is worsening. Her daughter is her health care POA, but the pt makes her own healthcare decisions at this time.  Most recent lab results (06/14/2019) of CBC is as follows: all values are WNL except for HGB at 11.4, MCH at 26.8. 06/21/2019 UPEP revealed protein/creat ratio at 209, total protein at 29, and two abnormal protein bands 06/14/2019 SPEP revealed total protein at 10.1, Alpha 2 at 1.0, gamma globulin at 4.1, abnormal protein band1 at 3.9   [name] is a 89 y.o. female, who is called for continued evaluation and management of her smoldering myeloma.  Aaron Aas.I connected with Darroll Emory on 03/28/2024 at  8:40 AM EDT by telephone visit and verified that I am speaking with the correct person using two identifiers.   I last connected with the patient via phone on 11/02/2023 and she complained of chronic fatigue.   Patient notes she has been doing well overall since our last visit. She does complain of chronic mild fatigue, which has not changed since our last visit. She denies any new infection issues, fever, chills, night sweats, unexpected  weight loss, back pain, chest pain, abdominal pain, or leg swelling.   She is complaint with all of her medications.   Discussed lab results from 03/14/2024 in detail with the patient.   I discussed the limitations, risks, security and privacy concerns of performing an evaluation and management service by telemedicine and the availability of in-person appointments. I also discussed with the patient that there may be a patient responsible charge related to this service. The patient expressed understanding and agreed to proceed.   Other persons participating in the visit and their role in the encounter: Patient's daughter  Patient's location: Home Provider's location: [facility]   Chief Complaint: Follow-up for multiple myeloma  MEDICAL HISTORY:  Past Medical History:  Diagnosis Date   History of skin cancer    Dr.John Hall   Hyperlipidemia    Hypertension    Osteoporosis     SURGICAL HISTORY: Past Surgical History:  Procedure Laterality Date   ABDOMINAL HYSTERECTOMY  1998    SOCIAL HISTORY: Social History   Socioeconomic History   Marital status: Single    Spouse name: Not on file   Number of children: Not on file   Years of education: Not on file   Highest education level: Not on file  Occupational History   Not on file  Tobacco Use   Smoking status: Former    Current packs/day: 0.00    Types: Cigarettes    Quit date: 12/08/2002    Years since quitting: 21.3  Smokeless tobacco: Never   Tobacco comments:    Quit t age 62   Vaping Use   Vaping status: Never Used  Substance and Sexual Activity   Alcohol use: No    Alcohol/week: 0.0 standard drinks of alcohol   Drug use: No   Sexual activity: Not Currently  Other Topics Concern   Not on file  Social History Narrative   ** Merged History Encounter **       Social Drivers of Health   Financial Resource Strain: Low Risk  (06/04/2018)   Overall Financial Resource Strain (CARDIA)    Difficulty of Paying Living  Expenses: Not hard at all  Food Insecurity: No Food Insecurity (06/04/2018)   Hunger Vital Sign    Worried About Running Out of Food in the Last Year: Never true    Ran Out of Food in the Last Year: Never true  Transportation Needs: No Transportation Needs (06/04/2018)   PRAPARE - Administrator, Civil Service (Medical): No    Lack of Transportation (Non-Medical): No  Physical Activity: Insufficiently Active (06/04/2018)   Exercise Vital Sign    Days of Exercise per Week: 3 days    Minutes of Exercise per Session: 30 min  Stress: No Stress Concern Present (06/04/2018)   [employer] of Occupational Health - Occupational Stress Questionnaire    Feeling of Stress : Not at all  Social Connections: Moderately Isolated (06/04/2018)   Social Connection and Isolation Panel [NHANES]    Frequency of Communication with Friends and Family: Three times a week    Frequency of Social Gatherings with Friends and Family: Three times a week    Attends Religious Services: Never    Active Member of Clubs or Organizations: No    Attends Banker Meetings: Never    Marital Status: Never married  Intimate Partner Violence: Not At Risk (06/04/2018)   Humiliation, Afraid, Rape, and Kick questionnaire    Fear of Current or Ex-Partner: No    Emotionally Abused: No    Physically Abused: No    Sexually Abused: No    FAMILY HISTORY: Family History  Problem Relation Age of Onset   Cancer Father    Dementia Sister    Breast cancer Maternal Grandmother    Breast cancer Paternal Grandmother     ALLERGIES:  has no known allergies.  MEDICATIONS:  Current Outpatient Medications  Medication Sig Dispense Refill   denosumab  (PROLIA ) 60 MG/ML SOSY injection Inject 60 mg into the skin every 6 (six) months. 60 mL 0   feeding supplement (BOOST HIGH PROTEIN) LIQD Take 2 Containers by mouth daily.     lisinopril  (ZESTRIL ) 10 MG tablet TAKE 1 TABLET(10 MG) BY MOUTH DAILY 90 tablet 1    simvastatin  (ZOCOR ) 20 MG tablet Take 1 tablet (20 mg total) by mouth daily. 90 tablet 1   Current Facility-Administered Medications  Medication Dose Route Frequency Provider Last Rate Last Admin   [START ON 08/29/2024] denosumab  (PROLIA ) injection 60 mg  60 mg Subcutaneous Q6 months Eubanks, Jessica K, NP        REVIEW OF SYSTEMS:    10 Point review of Systems was done is negative except as noted above.   PHYSICAL EXAMINATION: Telemedicine visit  LABORATORY DATA:  I have reviewed the data as listed  .    Latest Ref Rng & Units 03/14/2024    9:00 AM 10/13/2023    9:29 AM 07/14/2023    9:17 AM  CBC  WBC  4.0 - 10.5 K/uL 3.6  3.4  3.3   Hemoglobin 12.0 - 15.0 g/dL 9.9  9.8  78.2   Hematocrit 36.0 - 46.0 % 31.7  30.5  31.9   Platelets 150 - 400 K/uL 207  191  183     .    Latest Ref Rng & Units 03/14/2024    9:00 AM 02/15/2024   10:04 AM 10/13/2023    9:29 AM  CMP  Glucose 70 - 99 mg/dL 84  69  77   BUN 8 - 23 mg/dL 18  18  16    Creatinine 0.44 - 1.00 mg/dL 9.56  2.13  0.86   Sodium 135 - 145 mmol/L 135  134  136   Potassium 3.5 - 5.1 mmol/L 4.4  4.2  4.3   Chloride 98 - 111 mmol/L 105  101  107   CO2 22 - 32 mmol/L 28  30  27    Calcium  8.9 - 10.3 mg/dL 9.6  57.8  9.3   Total Protein 6.5 - 8.1 g/dL 46.9  62.9  52.8   Total Bilirubin 0.0 - 1.2 mg/dL 0.3  0.2  0.3   Alkaline Phos 38 - 126 U/L 45   41   AST 15 - 41 U/L 13  14  13    ALT 0 - 44 U/L 6  6  7      09/02/2019 Bone Marrow Biopsy ([id number])   08/05/2019 Bone Marrow Biopsy ([id number])        RADIOGRAPHIC STUDIES: I have personally reviewed the radiological images as listed and agreed with the findings in the report. No results found.   ASSESSMENT & PLAN:  Jasmine Fletcher is a 89 y.o. female with:  #1 IgG Kappa Monoclonal paraproteinemia with M protein of 3.9 -highly concerning for MM  06/14/2019 SPEP revealed total protein at 10.1, alpha 2 at 1.0, gamma globulin at 4.1, Abnormal protein band1 at  3.9 UPEP +ve for Bence Jones protein  08/08/2019 PET scan with results revealing "Two right thyroid  nodules are both highly hypermetabolic. A significant minority of hypermetabolic thyroid  lesions represent thyroid  malignancy. Further workup with thyroid  ultrasound is recommended. A small left upper para-aortic lymph node is mildly hypermetabolic with maximum SUV 4.7, significance uncertain. No significant abnormal accentuated osseous activity or compelling focal findings of active multiple myeloma in the skeleton. Hypodense lesions in the liver are photopenic and likely benign Hyperdense small lesions of the left kidney upper pole are too small to characterize but statistically most likely to be complex cysts. If the patient has hematuria or if otherwise clinically warranted, renal protocol MRI with and without contrast could be utilized for further characterization. Mild mid to distal accentuated thoracic esophageal activity with maximum SUV of 4.8; mildly accentuated activity in the gastric cardia/gastroesophageal junction with maximum SUV 5.8. Both could be physiologic but rarely low-grade malignancy could have a similar appearance, upper endoscopy could be utilized for further workup if clinically warranted. Other imaging findings of potential clinical significance: Chronic ischemic microvascular white matter disease intracranially. Aortic Atherosclerosis (ICD10-I70.0). Mild cardiomegaly. Biapical pleuroparenchymal scarring and probable scarring in the superior segment left lower lobe. Calcified uterine fibroids. Thoracolumbar scoliosis. No clear bone lesions."  08/05/2019 bone marrow biopsy ([id number]) with results revealing "no monoclonal b cell population or abnormal T cell phenotype identified. Atypical lymphoplasmacytoid aggregates - not clear for myeloma  09/02/2019 bone marrow biopsy ([id number]) with results revealing small popoulation of monoclonal plasma cell. Does not appear to co-related  with M protein levels  and seem to be underrepresented.  #2. FDG avid thyroid  nodules  #3.  Patient Active Problem List   Diagnosis Date Noted   Multiple myeloma not having achieved remission (HCC) 02/10/2022   Depression, major, single episode, complete remission (HCC) 02/13/2020   Smoldering myeloma 10/17/2019   Vitamin D  deficiency 10/17/2019   Dementia without behavioral disturbance (HCC) 07/01/2017   Mixed hyperlipidemia 11/29/2013   Osteoporosis 03/15/2013   BRADYCARDIA 02/17/2009   HYPERTENSION, BENIGN ESSENTIAL 02/16/2009   ANEMIA, HYPOCHROMIC 07/26/2007   DISORDER, TOBACCO USE 07/26/2007   DEPRESSION 07/26/2007   CARDIAC CATHETERIZATION, LEFT, HX OF 07/26/2007    PLAN:  -Discussed lab results from 03/14/2024 in detail with the patient. CBC shows slightly low WBC of 3.6 K, mild anemia with Hgb of 9.9 g/dL with Hct of 16.1%. CMP is stable with improved calcium  level from 10.5 mg/dL with 9.6 mg/dL.  -Discussed multiple myeloma panel results from 03/14/2024 in detail with the patient. Showed elevated IgG level of 6,903 and elevated M-protein of 3.9.  -Discussed the option of bone marrow biopsy and treatment consideration for high risk smoldering myeloma with daratumumab -Answered all of patient's questions.  -patient prefers to continue to take a conservative approach with monitoring labs at this time.  FOLLOW UP: Phone visit with Dr Salomon Cree in 4 months Labs 2 weeks prior to phone visit   The total time spent in the appointment was 20 minutes* .  All of the patient's questions were answered with apparent satisfaction. The patient knows to call the clinic with any problems, questions or concerns.   Jacquelyn Matt MD MS AAHIVMS [facility] Main Hematology/Oncology Physician [facility]  .*Total Encounter Time as defined by the Centers for Medicare and Medicaid Services includes, in addition to the face-to-face time of a patient visit (documented in the note above)  non-face-to-face time: obtaining and reviewing outside history, ordering and reviewing medications, tests or procedures, care coordination (communications with other health care professionals or caregivers) and documentation in the medical record.   I,Param Shah,acting as a Neurosurgeon for Jacquelyn Matt, MD.,have documented all relevant documentation on the behalf of Jacquelyn Matt, MD,as directed by  Jacquelyn Matt, MD while in the presence of Jacquelyn Matt, MD.  .I have reviewed the above documentation for accuracy and completeness, and I agree with the above. .Kazuo Durnil Kishore Kyshaun Barnette MD

## 2024-03-29 ENCOUNTER — Telehealth: Payer: Self-pay | Admitting: Hematology

## 2024-03-29 NOTE — Telephone Encounter (Signed)
 Spoke with patient confirming upcoming appointment

## 2024-07-01 ENCOUNTER — Ambulatory Visit: Payer: 59 | Admitting: Nurse Practitioner

## 2024-07-04 ENCOUNTER — Ambulatory Visit: Admitting: Nurse Practitioner

## 2024-07-08 ENCOUNTER — Other Ambulatory Visit: Payer: Self-pay

## 2024-07-08 DIAGNOSIS — D472 Monoclonal gammopathy: Secondary | ICD-10-CM

## 2024-07-11 ENCOUNTER — Inpatient Hospital Stay: Attending: Hematology

## 2024-07-11 DIAGNOSIS — R5383 Other fatigue: Secondary | ICD-10-CM | POA: Insufficient documentation

## 2024-07-11 DIAGNOSIS — D472 Monoclonal gammopathy: Secondary | ICD-10-CM | POA: Diagnosis not present

## 2024-07-11 DIAGNOSIS — E042 Nontoxic multinodular goiter: Secondary | ICD-10-CM | POA: Diagnosis not present

## 2024-07-11 DIAGNOSIS — Z87891 Personal history of nicotine dependence: Secondary | ICD-10-CM | POA: Diagnosis not present

## 2024-07-11 DIAGNOSIS — D649 Anemia, unspecified: Secondary | ICD-10-CM | POA: Diagnosis not present

## 2024-07-11 LAB — CBC WITH DIFFERENTIAL (CANCER CENTER ONLY)
Abs Immature Granulocytes: 0 K/uL (ref 0.00–0.07)
Basophils Absolute: 0 K/uL (ref 0.0–0.1)
Basophils Relative: 1 %
Eosinophils Absolute: 0 K/uL (ref 0.0–0.5)
Eosinophils Relative: 0 %
HCT: 29 % — ABNORMAL LOW (ref 36.0–46.0)
Hemoglobin: 9.1 g/dL — ABNORMAL LOW (ref 12.0–15.0)
Immature Granulocytes: 0 %
Lymphocytes Relative: 34 %
Lymphs Abs: 1.2 K/uL (ref 0.7–4.0)
MCH: 26.6 pg (ref 26.0–34.0)
MCHC: 31.4 g/dL (ref 30.0–36.0)
MCV: 84.8 fL (ref 80.0–100.0)
Monocytes Absolute: 0.5 K/uL (ref 0.1–1.0)
Monocytes Relative: 14 %
Neutro Abs: 1.8 K/uL (ref 1.7–7.7)
Neutrophils Relative %: 51 %
Platelet Count: 178 K/uL (ref 150–400)
RBC: 3.42 MIL/uL — ABNORMAL LOW (ref 3.87–5.11)
RDW: 14.9 % (ref 11.5–15.5)
WBC Count: 3.4 K/uL — ABNORMAL LOW (ref 4.0–10.5)
nRBC: 0 % (ref 0.0–0.2)

## 2024-07-11 LAB — CMP (CANCER CENTER ONLY)
ALT: 6 U/L (ref 0–44)
AST: 14 U/L — ABNORMAL LOW (ref 15–41)
Albumin: 3.1 g/dL — ABNORMAL LOW (ref 3.5–5.0)
Alkaline Phosphatase: 29 U/L — ABNORMAL LOW (ref 38–126)
Anion gap: 2 — ABNORMAL LOW (ref 5–15)
BUN: 20 mg/dL (ref 8–23)
CO2: 28 mmol/L (ref 22–32)
Calcium: 9.9 mg/dL (ref 8.9–10.3)
Chloride: 103 mmol/L (ref 98–111)
Creatinine: 0.92 mg/dL (ref 0.44–1.00)
GFR, Estimated: 59 mL/min — ABNORMAL LOW (ref >60)
Glucose, Bld: 75 mg/dL (ref 70–99)
Potassium: 4 mmol/L (ref 3.5–5.1)
Sodium: 133 mmol/L — ABNORMAL LOW (ref 135–145)
Total Bilirubin: 0.2 mg/dL (ref 0.0–1.2)
Total Protein: 11 g/dL — ABNORMAL HIGH (ref 6.5–8.1)

## 2024-07-13 LAB — MULTIPLE MYELOMA PANEL, SERUM
Albumin SerPl Elph-Mcnc: 3.9 g/dL (ref 2.9–4.4)
Albumin/Glob SerPl: 0.6 — ABNORMAL LOW (ref 0.7–1.7)
Alpha 1: 0.2 g/dL (ref 0.0–0.4)
Alpha2 Glob SerPl Elph-Mcnc: 0.6 g/dL (ref 0.4–1.0)
B-Globulin SerPl Elph-Mcnc: 0.8 g/dL (ref 0.7–1.3)
Gamma Glob SerPl Elph-Mcnc: 5.1 g/dL — ABNORMAL HIGH (ref 0.4–1.8)
Globulin, Total: 6.7 g/dL — ABNORMAL HIGH (ref 2.2–3.9)
IgA: 41 mg/dL — ABNORMAL LOW (ref 64–422)
IgG (Immunoglobin G), Serum: 6581 mg/dL — ABNORMAL HIGH (ref 586–1602)
IgM (Immunoglobulin M), Srm: 10 mg/dL — ABNORMAL LOW (ref 26–217)
M Protein SerPl Elph-Mcnc: 4.9 g/dL — ABNORMAL HIGH
Total Protein ELP: 10.6 g/dL — ABNORMAL HIGH (ref 6.0–8.5)

## 2024-07-14 ENCOUNTER — Other Ambulatory Visit: Payer: Self-pay | Admitting: *Deleted

## 2024-07-14 MED ORDER — DENOSUMAB 60 MG/ML ~~LOC~~ SOSY: 60.0000 mg | PREFILLED_SYRINGE | SUBCUTANEOUS | 0 refills | Status: DC

## 2024-07-14 NOTE — Telephone Encounter (Signed)
 Patient due for her Prolia  injection Pended medication and sent to [facility] for approval due to HIGH ALERT Warning.

## 2024-07-14 NOTE — Telephone Encounter (Signed)
 Patient gets Prolia  through her pharmacy and brings it to the clinic for injections.

## 2024-07-14 NOTE — Addendum Note (Signed)
 Addended by: LYNNANN COUGH A on: 07/14/2024 01:22 PM   Modules accepted: Orders

## 2024-07-21 ENCOUNTER — Other Ambulatory Visit: Payer: Self-pay | Admitting: *Deleted

## 2024-07-21 MED ORDER — DENOSUMAB 60 MG/ML ~~LOC~~ SOSY
60.0000 mg | PREFILLED_SYRINGE | SUBCUTANEOUS | 0 refills | Status: AC
Start: 2024-07-21 — End: ?

## 2024-07-21 NOTE — Telephone Encounter (Signed)
 Patient due for Prolia  08/29/2024. Rx sent to pharmacy.

## 2024-07-25 ENCOUNTER — Inpatient Hospital Stay (HOSPITAL_BASED_OUTPATIENT_CLINIC_OR_DEPARTMENT_OTHER): Admitting: Hematology

## 2024-07-25 DIAGNOSIS — C9000 Multiple myeloma not having achieved remission: Secondary | ICD-10-CM | POA: Diagnosis not present

## 2024-07-25 DIAGNOSIS — D649 Anemia, unspecified: Secondary | ICD-10-CM | POA: Diagnosis not present

## 2024-07-25 DIAGNOSIS — R5383 Other fatigue: Secondary | ICD-10-CM | POA: Diagnosis not present

## 2024-07-25 DIAGNOSIS — Z87891 Personal history of nicotine dependence: Secondary | ICD-10-CM | POA: Diagnosis not present

## 2024-07-25 DIAGNOSIS — D472 Monoclonal gammopathy: Secondary | ICD-10-CM | POA: Diagnosis not present

## 2024-07-25 DIAGNOSIS — E042 Nontoxic multinodular goiter: Secondary | ICD-10-CM | POA: Diagnosis not present

## 2024-07-25 NOTE — Progress Notes (Signed)
 HEMATOLOGY/ONCOLOGY PHONE VISIT NOTE  Date of Service: 07/25/24   Patient Care Team: Caro Harlene POUR, NP as PCP - General (Geriatric Medicine)   CHIEF COMPLAINTS:  Follow-up for evaluation of smoldering myeloma with concerns for progression of active disease   HISTORY OF PRESENTING ILLNESS:   Jasmine Fletcher is a wonderful 89 y.o. female who has been referred to us  by Caro Harlene POUR, NP for evaluation and management of elevated serum globulin levels. The pt reports that she is doing well overall.  The pt reports that her appetite has decreased recently but that she eats a healthy diet. She notes mild fatigue. Denies new bone pain, back pain, hip pain, infection concerns, and any other new concerns.  She has been getting a Prolia  shot every 6 months for the past 2 years. She has also been taking Aricept  but notes that her memory is worsening. Her daughter is her health care POA, but the pt makes her own healthcare decisions at this time.  Most recent lab results (06/14/2019) of CBC is as follows: all values are WNL except for HGB at 11.4, MCH at 26.8. 06/21/2019 UPEP revealed protein/creat ratio at 209, total protein at 29, and two abnormal protein bands 06/14/2019 SPEP revealed total protein at 10.1, Alpha 2 at 1.0, gamma globulin at 4.1, abnormal protein band1 at 3.9   INTERVAL HISTORY:  Jasmine Fletcher is a 89 y.o. female, who is called for continued evaluation and management of her smoldering myeloma.  [email or web address] connected with Jasmine Fletcher on .07/25/2024 at  8:40 AM EDT by telephone visit and verified that I am speaking with the correct person using two identifiers.   I discussed the limitations, risks, security and privacy concerns of performing an evaluation and management service by telemedicine and the availability of in-person appointments. I also discussed with the patient that there may be a patient responsible charge related to this service. The patient expressed  understanding and agreed to proceed.   Other persons participating in the visit and their role in the encounter: Patient's daughter  Patient's location: Home Provider's location: [facility]   Chief Complaint: Follow-up for multiple myeloma  Patient notes some increased fatigue since her last phone visit about 4 months ago.  She has some arthritic pains but does not note any obvious focal bone pains.  Notes that she has been drinking more water and consuming calcium  rich foods and also taking additional supplements in terms of using boost.  She notes having lost about 5 pounds despite good diet and good p.o. intake. We discussed her recent labs which show concerns for progression of her smoldering myeloma to active myeloma with worsening anemia and significant drop in her M protein. She is hesitant to repeat a bone marrow biopsy given her previous experience with this and indeterminate results but is agreeable to getting a repeat PET scan. We discussed that we shall meet after the PET scan to discuss specific treatment considerations. Daughter was on the phone with us  and was actively involved in the discussion and decision making along with the patient.  MEDICAL HISTORY:  Past Medical History:  Diagnosis Date   History of skin cancer    Dr.John Hall   Hyperlipidemia    Hypertension    Osteoporosis     SURGICAL HISTORY: Past Surgical History:  Procedure Laterality Date   ABDOMINAL HYSTERECTOMY  1998    SOCIAL HISTORY: Social History   Socioeconomic History   Marital status: Single  Spouse name: Not on file   Number of children: Not on file   Years of education: Not on file   Highest education level: Not on file  Occupational History   Not on file  Tobacco Use   Smoking status: Former    Current packs/day: 0.00    Types: Cigarettes    Quit date: 12/08/2002    Years since quitting: 21.6   Smokeless tobacco: Never   Tobacco comments:    Quit t age 66   Vaping Use   Vaping  status: Never Used  Substance and Sexual Activity   Alcohol use: No    Alcohol/week: 0.0 standard drinks of alcohol   Drug use: No   Sexual activity: Not Currently  Other Topics Concern   Not on file  Social History Narrative   ** Merged History Encounter **       Social Drivers of Health   Financial Resource Strain: Low Risk  (06/04/2018)   Overall Financial Resource Strain (CARDIA)    Difficulty of Paying Living Expenses: Not hard at all  Food Insecurity: No Food Insecurity (06/04/2018)   Hunger Vital Sign    Worried About Running Out of Food in the Last Year: Never true    Ran Out of Food in the Last Year: Never true  Transportation Needs: No Transportation Needs (06/04/2018)   PRAPARE - Administrator, Civil Service (Medical): No    Lack of Transportation (Non-Medical): No  Physical Activity: Insufficiently Active (06/04/2018)   Exercise Vital Sign    Days of Exercise per Week: 3 days    Minutes of Exercise per Session: 30 min  Stress: No Stress Concern Present (06/04/2018)   [employer] of Occupational Health - Occupational Stress Questionnaire    Feeling of Stress : Not at all  Social Connections: Moderately Isolated (06/04/2018)   Social Connection and Isolation Panel    Frequency of Communication with Friends and Family: Three times a week    Frequency of Social Gatherings with Friends and Family: Three times a week    Attends Religious Services: Never    Active Member of Clubs or Organizations: No    Attends Banker Meetings: Never    Marital Status: Never married  Intimate Partner Violence: Not At Risk (06/04/2018)   Humiliation, Afraid, Rape, and Kick questionnaire    Fear of Current or Ex-Partner: No    Emotionally Abused: No    Physically Abused: No    Sexually Abused: No    FAMILY HISTORY: Family History  Problem Relation Age of Onset   Cancer Father    Dementia Sister    Breast cancer Maternal Grandmother    Breast cancer  Paternal Grandmother     ALLERGIES:  has no known allergies.  MEDICATIONS:  Current Outpatient Medications  Medication Sig Dispense Refill   denosumab  (PROLIA ) 60 MG/ML SOSY injection Inject 60 mg into the skin every 6 (six) months. 60 mL 0   feeding supplement (BOOST HIGH PROTEIN) LIQD Take 2 Containers by mouth daily.     lisinopril  (ZESTRIL ) 10 MG tablet TAKE 1 TABLET(10 MG) BY MOUTH DAILY 90 tablet 1   simvastatin  (ZOCOR ) 20 MG tablet Take 1 tablet (20 mg total) by mouth daily. 90 tablet 1   Current Facility-Administered Medications  Medication Dose Route Frequency Provider Last Rate Last Admin   [START ON 08/29/2024] denosumab  (PROLIA ) injection 60 mg  60 mg Subcutaneous Q6 months Eubanks, Jessica K, NP  REVIEW OF SYSTEMS:    10 Point review of Systems was done is negative except as noted above.  PHYSICAL EXAMINATION: Telemedicine visit  LABORATORY DATA:  I have reviewed the data as listed  .    Latest Ref Rng & Units 07/11/2024    9:20 AM 03/14/2024    9:00 AM 10/13/2023    9:29 AM  CBC  WBC 4.0 - 10.5 K/uL 3.4  3.6  3.4   Hemoglobin 12.0 - 15.0 g/dL 9.1  9.9  9.8   Hematocrit 36.0 - 46.0 % 29.0  31.7  30.5   Platelets 150 - 400 K/uL 178  207  191     .    Latest Ref Rng & Units 07/11/2024    9:20 AM 03/14/2024    9:00 AM 02/15/2024   10:04 AM  CMP  Glucose 70 - 99 mg/dL 75  84  69   BUN 8 - 23 mg/dL 20  18  18    Creatinine 0.44 - 1.00 mg/dL 9.07  9.07  9.05   Sodium 135 - 145 mmol/L 133  135  134   Potassium 3.5 - 5.1 mmol/L 4.0  4.4  4.2   Chloride 98 - 111 mmol/L 103  105  101   CO2 22 - 32 mmol/L 28  28  30    Calcium  8.9 - 10.3 mg/dL 9.9  9.6  89.4   Total Protein 6.5 - 8.1 g/dL 88.9  88.8  88.4   Total Bilirubin 0.0 - 1.2 mg/dL 0.2  0.3  0.2   Alkaline Phos 38 - 126 U/L 29  45    AST 15 - 41 U/L 14  13  14    ALT 0 - 44 U/L 6  6  6     Multiple Myeloma Panel (SPEP&IFE w/QIG) Order: [id number]  Status: Edited Result - FINAL     Next appt: 08/05/2024  at 10:20 AM in Internal Medicine [facility] MARLA An, NP)     Dx: Smoldering myeloma   Test Result Released: No (inaccessible in MyChart)   0 Result Notes          Component Ref Range & Units (hover) 2 wk ago (07/11/24) 4 mo ago (03/14/24) 9 mo ago (10/13/23) 1 yr ago (07/14/23) 1 yr ago (04/03/23) 1 yr ago (01/21/23) 1 yr ago (09/12/22)  IgG (Immunoglobin G), Serum 6,581 High  6,903 High  CM 6,012 High  CM 5,690 High  CM 5,416 High  CM 5,093 High  CM 5,155 High  CM  Comment: (NOTE) Results confirmed on dilution.  IgA 41 Low  52 Low  48 Low  CM 55 Low  58 Low  64 56 Low   Comment: Result confirmed on concentration.  IgM (Immunoglobulin M), Srm 10 Low  11 Low  CM 10 Low  CM 12 Low  CM 12 Low  CM 13 Low  CM 12 Low  CM  Comment: Result confirmed on concentration.  Total Protein ELP 10.6 High  VC 10.2 High  VC 9.9 High  VC 9.9 High  VC 9.4 High  VC 9.7 High  VC 9.7 High  VC  Albumin SerPl Elph-Mcnc 3.9 VC 3.7 VC 3.9 VC 3.8 VC 3.6 VC 3.8 VC 3.8 VC  Alpha 1 0.2 VC 0.4 VC 0.3 VC 0.3 VC 0.3 VC 0.3 VC 0.3 VC  Alpha2 Glob SerPl Elph-Mcnc 0.6 VC 0.8 VC 0.7 VC 0.7 VC 0.8 VC 0.7 VC 0.7 VC  B-Globulin SerPl Elph-Mcnc 0.8 VC 1.0 VC 0.8 VC  0.9 VC 0.9 VC 0.9 VC 0.9 VC  Gamma Glob SerPl Elph-Mcnc 5.1 High  VC 4.3 High  VC 4.3 High  VC 4.3 High  VC 3.8 High  VC 4.0 High  VC 3.9 High  VC  M Protein SerPl Elph-Mcnc 4.9 High  VC 3.9 High  VC 3.8 High  VC 4.0 High  VC 3.2 High  VC 3.7 High  VC 3.3 High  VC      09/02/2019 Bone Marrow Biopsy ([id number])   08/05/2019 Bone Marrow Biopsy ([id number])        RADIOGRAPHIC STUDIES: I have personally reviewed the radiological images as listed and agreed with the findings in the report. No results found.   ASSESSMENT & PLAN:  ORRA NOLDE is a 89 y.o. female with:  #1 IgG Kappa Monoclonal paraproteinemia with M protein of 3.9 -highly concerning for MM  06/14/2019 SPEP revealed total protein at 10.1, alpha 2 at 1.0, gamma globulin at 4.1, Abnormal  protein band1 at 3.9 UPEP +ve for Bence Jones protein  08/08/2019 PET scan with results revealing Two right thyroid  nodules are both highly hypermetabolic. A significant minority of hypermetabolic thyroid  lesions represent thyroid  malignancy. Further workup with thyroid  ultrasound is recommended. A small left upper para-aortic lymph node is mildly hypermetabolic with maximum SUV 4.7, significance uncertain. No significant abnormal accentuated osseous activity or compelling focal findings of active multiple myeloma in the skeleton. Hypodense lesions in the liver are photopenic and likely benign Hyperdense small lesions of the left kidney upper pole are too small to characterize but statistically most likely to be complex cysts. If the patient has hematuria or if otherwise clinically warranted, renal protocol MRI with and without contrast could be utilized for further characterization. Mild mid to distal accentuated thoracic esophageal activity with maximum SUV of 4.8; mildly accentuated activity in the gastric cardia/gastroesophageal junction with maximum SUV 5.8. Both could be physiologic but rarely low-grade malignancy could have a similar appearance, upper endoscopy could be utilized for further workup if clinically warranted. Other imaging findings of potential clinical significance: Chronic ischemic microvascular white matter disease intracranially. Aortic Atherosclerosis (ICD10-I70.0). Mild cardiomegaly. Biapical pleuroparenchymal scarring and probable scarring in the superior segment left lower lobe. Calcified uterine fibroids. Thoracolumbar scoliosis. No clear bone lesions.  08/05/2019 bone marrow biopsy ([id number]) with results revealing no monoclonal b cell population or abnormal T cell phenotype identified. Atypical lymphoplasmacytoid aggregates - not clear for myeloma  09/02/2019 bone marrow biopsy ([id number]) with results revealing small popoulation of monoclonal plasma cell. Does not  appear to co-related with M protein levels and seem to be underrepresented.  #2. FDG avid thyroid  nodules  #3.  Patient Active Problem List   Diagnosis Date Noted   Multiple myeloma not having achieved remission (HCC) 02/10/2022   Depression, major, single episode, complete remission (HCC) 02/13/2020   Smoldering myeloma 10/17/2019   Vitamin D  deficiency 10/17/2019   Dementia without behavioral disturbance (HCC) 07/01/2017   Mixed hyperlipidemia 11/29/2013   Osteoporosis 03/15/2013   BRADYCARDIA 02/17/2009   HYPERTENSION, BENIGN ESSENTIAL 02/16/2009   ANEMIA, HYPOCHROMIC 07/26/2007   DISORDER, TOBACCO USE 07/26/2007   DEPRESSION 07/26/2007   CARDIAC CATHETERIZATION, LEFT, HX OF 07/26/2007    PLAN:  - I discussed patient's labs from 8//2025 in details with her and her daughter. CBC shows worsening anemia with a progressive drop in her hemoglobin from 11 to 10 to 9.1 now.  WBC count and platelets are stable CMP shows significantly elevated total protein level of 11 with creatinine  that is normal at 0.92 and a calcium  of 9.9 Myeloma panel shows a total IgG level of 6.5 g/dL with an M spike of 4.9 g/dL which is significant increase from her previous level you of 3.9 g/dL. - Patient on previous discussions has wanted to be conservative with her workup and treatment. - She had declined consideration of daratumumab for high risk smoldering myeloma previously. - We discussed that this point there is a concern for progression to active myeloma with worsening anemia and fatigue and increasing M spike. - We discussed that we would typically repeat a bone marrow biopsy to characterize the extent of bone marrow involvement and to get cytogenetics and molecular cytogenetics to define treatment options and for prognosis and will get a PET CT scan to evaluate for bone lesions. - The patient declines option for bone marrow biopsy since the previous ones were uncomfortable and indeterminate. - She is  agreeable to a PET scan which has been ordered. - We will see her back in clinic in 2 to 3 weeks after she has had the PET scan and might need to consider treatment options without repeat bone marrow biopsy or knowing molecular cytogenetics. - Continue to optimize nutrition, p.o. fluid intake and fall precautions and infection precautions. - Counseled to stay up to speed with her age-appropriate vaccinations. - Patient currently has no symptoms suggestive of hyperviscosity syndrome  FOLLOW UP: PET scan in 1 week Return to clinic with Dr. Onesimo with labs in 3 weeks  .The total time spent in the appointment was 25 minutes* .  All of the patient's questions were answered with apparent satisfaction. The patient knows to call the clinic with any problems, questions or concerns.   Emaline Onesimo MD MS AAHIVMS [facility] Hematology/Oncology Physician [facility]  .*Total Encounter Time as defined by the Centers for Medicare and Medicaid Services includes, in addition to the face-to-face time of a patient visit (documented in the note above) non-face-to-face time: obtaining and reviewing outside history, ordering and reviewing medications, tests or procedures, care coordination (communications with other health care professionals or caregivers) and documentation in the medical record.

## 2024-08-05 ENCOUNTER — Encounter: Payer: Self-pay | Admitting: Nurse Practitioner

## 2024-08-05 ENCOUNTER — Ambulatory Visit: Admitting: Nurse Practitioner

## 2024-08-05 VITALS — BP 138/80 | HR 73 | Temp 98.5°F | Ht 69.0 in | Wt 110.8 lb

## 2024-08-05 DIAGNOSIS — F0390 Unspecified dementia without behavioral disturbance: Secondary | ICD-10-CM

## 2024-08-05 DIAGNOSIS — E782 Mixed hyperlipidemia: Secondary | ICD-10-CM | POA: Diagnosis not present

## 2024-08-05 DIAGNOSIS — I10 Essential (primary) hypertension: Secondary | ICD-10-CM

## 2024-08-05 DIAGNOSIS — M810 Age-related osteoporosis without current pathological fracture: Secondary | ICD-10-CM

## 2024-08-05 DIAGNOSIS — R634 Abnormal weight loss: Secondary | ICD-10-CM | POA: Diagnosis not present

## 2024-08-05 DIAGNOSIS — D472 Monoclonal gammopathy: Secondary | ICD-10-CM | POA: Diagnosis not present

## 2024-08-05 MED ORDER — MIRTAZAPINE 7.5 MG PO TABS: 7.5000 mg | ORAL_TABLET | Freq: Every day | ORAL | 1 refills | Status: DC

## 2024-08-05 MED ORDER — LISINOPRIL 10 MG PO TABS
ORAL_TABLET | ORAL | 1 refills | Status: AC
Start: 2024-08-05 — End: ?

## 2024-08-05 MED ORDER — SIMVASTATIN 20 MG PO TABS: 20.0000 mg | ORAL_TABLET | Freq: Every day | ORAL | 1 refills | Status: AC

## 2024-08-05 MED ORDER — DENOSUMAB 60 MG/ML ~~LOC~~ SOSY
60.0000 mg | PREFILLED_SYRINGE | Freq: Once | SUBCUTANEOUS | Status: AC
  Administered 2024-08-05: 60 mg via SUBCUTANEOUS

## 2024-08-05 NOTE — Assessment & Plan Note (Signed)
 Continues on prolia , received today.

## 2024-08-05 NOTE — Assessment & Plan Note (Signed)
 10 lbs since January. Reports decrease in appetite and intake Will add remeron  to help stimulate appetite.  encouraged to liberalize diet. To have protein supplement in addition to smallest meal of the day.

## 2024-08-05 NOTE — Patient Instructions (Signed)
 Start remeron  7.5 mg by mouth at bedtime to help with appetite.

## 2024-08-05 NOTE — Progress Notes (Signed)
 Careteam: Patient Care Team: Caro Harlene POUR, NP as PCP - General (Geriatric Medicine)  PLACE OF SERVICE:  [facility]  Advanced Directive information    No Known Allergies  Chief Complaint  Patient presents with   Follow-up    6 month follow up Discussed the need for Flu vaaccine    HPI:  Discussed the use of AI scribe software for clinical note transcription with the patient, who gave verbal consent to proceed.  History of Present Illness Jasmine Fletcher is a 89 year old female who is here for routine follow up. She has no concerns today but noted she has unintentional weight loss and decrease in appetite.  She has experienced a weight loss of ten pounds since January. Her appetite varies, with periods of eating well and times when she does not feel like eating. She tends to eat better in the company of others. She enjoys vegetables but is not fond of meats. There is no difficulty swallowing, but she mentions some difficulty with chewing (but no pain) which may affect her food intake.  Her sleep patterns are variable, sometimes sleeping well at night and other times sleeping during the day. No significant anxiety or depression reported.  She is taking lisinopril  10 mg daily for blood pressure and Zocor  for cholesterol.  No issues with constipation, diarrhea, vaginal bleeding, blood in the urine, or blood in the stool. She reports occasional headaches and mild swelling, which she manages by sitting with her feet elevated.    Review of Systems:  Review of Systems  Constitutional:  Positive for weight loss. Negative for chills and fever.  HENT:  Negative for tinnitus.   Respiratory:  Negative for cough, sputum production and shortness of breath.   Cardiovascular:  Negative for chest pain, palpitations and leg swelling.  Gastrointestinal:  Negative for abdominal pain, constipation, diarrhea and heartburn.  Genitourinary:  Negative for dysuria, frequency and urgency.   Musculoskeletal:  Negative for back pain, falls, joint pain and myalgias.  Skin: Negative.   Neurological:  Negative for dizziness and headaches.  Psychiatric/Behavioral:  Negative for depression and memory loss. The patient does not have insomnia.     Past Medical History:  Diagnosis Date   History of skin cancer    Dr.John Hall   Hyperlipidemia    Hypertension    Osteoporosis    Past Surgical History:  Procedure Laterality Date   ABDOMINAL HYSTERECTOMY  1998   Social History:   reports that she quit smoking about 21 years ago. Her smoking use included cigarettes. She has never used smokeless tobacco. She reports that she does not drink alcohol and does not use drugs.  Family History  Problem Relation Age of Onset   Cancer Father    Dementia Sister    Breast cancer Maternal Grandmother    Breast cancer Paternal Grandmother     Medications: Patient's Medications  New Prescriptions   No medications on file  Previous Medications   DENOSUMAB  (PROLIA ) 60 MG/ML SOSY INJECTION    Inject 60 mg into the skin every 6 (six) months.   FEEDING SUPPLEMENT (BOOST HIGH PROTEIN) LIQD    Take 2 Containers by mouth daily.   LISINOPRIL  (ZESTRIL ) 10 MG TABLET    TAKE 1 TABLET(10 MG) BY MOUTH DAILY   SIMVASTATIN  (ZOCOR ) 20 MG TABLET    Take 1 tablet (20 mg total) by mouth daily.  Modified Medications   No medications on file  Discontinued Medications   No medications  on file    Physical Exam:  Vitals:   08/05/24 1013  BP: 138/80  Pulse: 73  Temp: 98.5 F (36.9 C)  SpO2: 98%  Weight: 110 lb 12.8 oz (50.3 kg)  Height: 5' 9 (1.753 m)   Body mass index is 16.36 kg/m. Wt Readings from Last 3 Encounters:  08/05/24 110 lb 12.8 oz (50.3 kg)  01/01/24 121 lb 3.2 oz (55 kg)  08/24/23 118 lb (53.5 kg)    Physical Exam Constitutional:      General: She is not in acute distress.    Appearance: She is well-developed. She is not diaphoretic.  HENT:     Head: Normocephalic and  atraumatic.     Mouth/Throat:     Pharynx: No oropharyngeal exudate.  Eyes:     Conjunctiva/sclera: Conjunctivae normal.     Pupils: Pupils are equal, round, and reactive to light.  Cardiovascular:     Rate and Rhythm: Normal rate and regular rhythm.     Heart sounds: Normal heart sounds.  Pulmonary:     Effort: Pulmonary effort is normal.     Breath sounds: Normal breath sounds.  Abdominal:     General: Bowel sounds are normal.     Palpations: Abdomen is soft.  Musculoskeletal:     Cervical back: Normal range of motion and neck supple.     Right lower leg: No edema.     Left lower leg: No edema.  Skin:    General: Skin is warm and dry.  Neurological:     Mental Status: She is alert.  Psychiatric:        Mood and Affect: Mood normal.     Labs reviewed: Basic Metabolic Panel: Recent Labs    02/15/24 1004 03/14/24 0900 07/11/24 0920  NA 134* 135 133*  K 4.2 4.4 4.0  CL 101 105 103  CO2 30 28 28   GLUCOSE 69 84 75  BUN 18 18 20   CREATININE 0.94 0.92 0.92  CALCIUM  10.5* 9.6 9.9   Liver Function Tests: Recent Labs    10/13/23 0929 02/15/24 1004 03/14/24 0900 07/11/24 0920  AST 13* 14 13* 14*  ALT 7 6 6 6   ALKPHOS 41  --  45 29*  BILITOT 0.3 0.2 0.3 0.2  PROT 10.6* 11.5* 11.1* 11.0*  ALBUMIN 3.3*  --  3.2* 3.1*   No results for input(s): LIPASE, AMYLASE in the last 8760 hours. No results for input(s): AMMONIA in the last 8760 hours. CBC: Recent Labs    10/13/23 0929 03/14/24 0900 07/11/24 0920  WBC 3.4* 3.6* 3.4*  NEUTROABS 1.9 1.9 1.8  HGB 9.8* 9.9* 9.1*  HCT 30.5* 31.7* 29.0*  MCV 85.9 85.4 84.8  PLT 191 207 178   Lipid Panel: Recent Labs    01/01/24 1021  CHOL 127  HDL 49*  LDLCALC 61  TRIG 87  CHOLHDL 2.6   TSH: No results for input(s): TSH in the last 8760 hours. A1C: No results found for: HGBA1C   Assessment/Plan  Smoldering myeloma Assessment & Plan: Stable, Under the care of oncology.   HYPERTENSION, BENIGN  ESSENTIAL Assessment & Plan: Blood pressure well controlled, goal bp <140/90 Continue lisinopril  and dietary modifications follow metabolic panel   Orders: -     Lisinopril ; TAKE 1 TABLET(10 MG) BY MOUTH DAILY  Dispense: 90 tablet; Refill: 1  Mixed hyperlipidemia Assessment & Plan: Continues on zocor . LDL at goal  Orders: -     Simvastatin ; Take 1 tablet (20 mg total) by  mouth daily.  Dispense: 90 tablet; Refill: 1  Osteoporosis, unspecified osteoporosis type, unspecified pathological fracture presence Assessment & Plan: Continues on prolia , received today.   Orders: -     Denosumab   Dementia without behavioral disturbance (HCC) Assessment & Plan: Stable, no acute changes in cognitive or functional status, continue supportive care with family support   Weight loss Assessment & Plan: 10 lbs since January. Reports decrease in appetite and intake Will add remeron  to help stimulate appetite.  encouraged to liberalize diet. To have protein supplement in addition to smallest meal of the day.  Orders: -     Mirtazapine ; Take 1 tablet (7.5 mg total) by mouth at bedtime.  Dispense: 30 tablet; Refill: 1     Return in about 2 months (around 10/05/2024) for weight.  Jerral Mccauley K. Caro BODILY [facility] [phone]

## 2024-08-05 NOTE — Assessment & Plan Note (Signed)
 Stable, no acute changes in cognitive or functional status, continue supportive care with family support

## 2024-08-05 NOTE — Assessment & Plan Note (Signed)
 Continues on zocor . LDL at goal

## 2024-08-05 NOTE — Assessment & Plan Note (Signed)
 Stable, Under the care of oncology.

## 2024-08-05 NOTE — Assessment & Plan Note (Signed)
 Blood pressure well controlled, goal bp <140/90 Continue lisinopril  and dietary modifications follow metabolic panel

## 2024-08-15 ENCOUNTER — Inpatient Hospital Stay: Admitting: Hematology

## 2024-08-15 ENCOUNTER — Other Ambulatory Visit: Payer: Self-pay | Admitting: Hematology

## 2024-08-15 ENCOUNTER — Ambulatory Visit (HOSPITAL_COMMUNITY)
Admission: RE | Admit: 2024-08-15 | Discharge: 2024-08-15 | Disposition: A | Discharge status: Home / Self Care | Source: Ambulatory Visit | Attending: Hematology | Admitting: Hematology

## 2024-08-15 ENCOUNTER — Inpatient Hospital Stay: Attending: Hematology

## 2024-08-15 DIAGNOSIS — C9000 Multiple myeloma not having achieved remission: Secondary | ICD-10-CM

## 2024-08-15 DIAGNOSIS — D472 Monoclonal gammopathy: Secondary | ICD-10-CM | POA: Diagnosis not present

## 2024-08-15 DIAGNOSIS — D649 Anemia, unspecified: Secondary | ICD-10-CM | POA: Insufficient documentation

## 2024-08-15 LAB — CBC WITH DIFFERENTIAL (CANCER CENTER ONLY)
Abs Immature Granulocytes: 0 K/uL (ref 0.00–0.07)
Basophils Absolute: 0 K/uL (ref 0.0–0.1)
Basophils Relative: 1 %
Eosinophils Absolute: 0 K/uL (ref 0.0–0.5)
Eosinophils Relative: 0 %
HCT: 30.8 % — ABNORMAL LOW (ref 36.0–46.0)
Hemoglobin: 9.7 g/dL — ABNORMAL LOW (ref 12.0–15.0)
Immature Granulocytes: 0 %
Lymphocytes Relative: 34 %
Lymphs Abs: 1.1 K/uL (ref 0.7–4.0)
MCH: 26.6 pg (ref 26.0–34.0)
MCHC: 31.5 g/dL (ref 30.0–36.0)
MCV: 84.6 fL (ref 80.0–100.0)
Monocytes Absolute: 0.5 K/uL (ref 0.1–1.0)
Monocytes Relative: 15 %
Neutro Abs: 1.6 K/uL — ABNORMAL LOW (ref 1.7–7.7)
Neutrophils Relative %: 50 %
Platelet Count: 176 K/uL (ref 150–400)
RBC: 3.64 MIL/uL — ABNORMAL LOW (ref 3.87–5.11)
RDW: 15.1 % (ref 11.5–15.5)
WBC Count: 3.3 K/uL — ABNORMAL LOW (ref 4.0–10.5)
nRBC: 0 % (ref 0.0–0.2)

## 2024-08-15 LAB — IRON AND IRON BINDING CAPACITY (CC-WL,HP ONLY)
Iron: 39 ug/dL (ref 28–170)
Saturation Ratios: 18 % (ref 10.4–31.8)
TIBC: 211 ug/dL — ABNORMAL LOW (ref 250–450)
UIBC: 172 ug/dL (ref 148–442)

## 2024-08-15 LAB — CMP (CANCER CENTER ONLY)
ALT: 7 U/L (ref 0–44)
AST: 15 U/L (ref 15–41)
Albumin: 3.3 g/dL — ABNORMAL LOW (ref 3.5–5.0)
Alkaline Phosphatase: 34 U/L — ABNORMAL LOW (ref 38–126)
Anion gap: 2 — ABNORMAL LOW (ref 5–15)
BUN: 16 mg/dL (ref 8–23)
CO2: 27 mmol/L (ref 22–32)
Calcium: 9.2 mg/dL (ref 8.9–10.3)
Chloride: 105 mmol/L (ref 98–111)
Creatinine: 0.79 mg/dL (ref 0.44–1.00)
GFR, Estimated: >60 mL/min (ref >60)
Glucose, Bld: 80 mg/dL (ref 70–99)
Potassium: 4.4 mmol/L (ref 3.5–5.1)
Sodium: 134 mmol/L — ABNORMAL LOW (ref 135–145)
Total Bilirubin: 0.2 mg/dL (ref 0.0–1.2)
Total Protein: 11.6 g/dL — ABNORMAL HIGH (ref 6.5–8.1)

## 2024-08-15 LAB — FERRITIN: Ferritin: 145 ng/mL (ref 11–307)

## 2024-08-15 LAB — GLUCOSE, CAPILLARY: Glucose-Capillary: 77 mg/dL (ref 70–99)

## 2024-08-15 LAB — LACTATE DEHYDROGENASE: LDH: 110 U/L (ref 98–192)

## 2024-08-15 MED ORDER — FLUDEOXYGLUCOSE F - 18 (FDG) INJECTION
5.4700 | Freq: Once | INTRAVENOUS | Status: AC | PRN
  Administered 2024-08-15: 5.47 via INTRAVENOUS

## 2024-08-17 LAB — MULTIPLE MYELOMA PANEL, SERUM
Albumin SerPl Elph-Mcnc: 4.1 g/dL (ref 2.9–4.4)
Albumin/Glob SerPl: 0.6 — ABNORMAL LOW (ref 0.7–1.7)
Alpha 1: 0.3 g/dL (ref 0.0–0.4)
Alpha2 Glob SerPl Elph-Mcnc: 0.8 g/dL (ref 0.4–1.0)
B-Globulin SerPl Elph-Mcnc: 0.9 g/dL (ref 0.7–1.3)
Gamma Glob SerPl Elph-Mcnc: 5 g/dL — ABNORMAL HIGH (ref 0.4–1.8)
Globulin, Total: 7 g/dL — ABNORMAL HIGH (ref 2.2–3.9)
IgA: 45 mg/dL — ABNORMAL LOW (ref 64–422)
IgG (Immunoglobin G), Serum: 7337 mg/dL — ABNORMAL HIGH (ref 586–1602)
IgM (Immunoglobulin M), Srm: 11 mg/dL — ABNORMAL LOW (ref 26–217)
M Protein SerPl Elph-Mcnc: 4.7 g/dL — ABNORMAL HIGH
Total Protein ELP: 11.1 g/dL — ABNORMAL HIGH (ref 6.0–8.5)

## 2024-08-18 LAB — KAPPA/LAMBDA LIGHT CHAINS
Kappa free light chain: 272.1 mg/L — ABNORMAL HIGH (ref 3.3–19.4)
Kappa, lambda light chain ratio: 36.77 — ABNORMAL HIGH (ref 0.26–1.65)
Lambda free light chains: 7.4 mg/L (ref 5.7–26.3)

## 2024-08-18 LAB — ERYTHROPOIETIN: Erythropoietin: 9.5 m[IU]/mL (ref 2.6–18.5)

## 2024-08-18 LAB — BETA 2 MICROGLOBULIN, SERUM: Beta-2 Microglobulin: 3.7 mg/L — ABNORMAL HIGH (ref 0.6–2.4)

## 2024-08-25 ENCOUNTER — Encounter: Payer: Self-pay | Admitting: Nurse Practitioner

## 2024-08-25 ENCOUNTER — Other Ambulatory Visit: Payer: 59

## 2024-08-25 NOTE — Progress Notes (Signed)
 This encounter was created in error - please disregard.  PET/CT not done yet

## 2024-08-26 ENCOUNTER — Encounter: Payer: Self-pay | Admitting: Nurse Practitioner

## 2024-08-26 ENCOUNTER — Ambulatory Visit (INDEPENDENT_AMBULATORY_CARE_PROVIDER_SITE_OTHER): Payer: 59 | Admitting: Nurse Practitioner

## 2024-08-26 VITALS — BP 132/78 | HR 69 | Temp 97.9°F | Ht 69.0 in | Wt 110.2 lb

## 2024-08-26 DIAGNOSIS — Z23 Encounter for immunization: Secondary | ICD-10-CM

## 2024-08-26 DIAGNOSIS — Z0000 Encounter for general adult medical examination without abnormal findings: Secondary | ICD-10-CM

## 2024-08-26 NOTE — Progress Notes (Signed)
 Subjective:   Jasmine Fletcher is a 89 y.o. female who presents for Medicare Annual (Subsequent) preventive examination.  Visit Complete: In person psc  Cardiac Risk Factors include: advanced age (>46men, >34 women);hypertension;dyslipidemia;sedentary lifestyle     Objective:    Today's Vitals   08/26/24 0855  BP: 132/78  Pulse: 69  Temp: 97.9 F (36.6 C)  TempSrc: Temporal  SpO2: 97%  Weight: 110 lb 3.2 oz (50 kg)  Height: 5' 9 (1.753 m)   Body mass index is 16.27 kg/m.     08/26/2024    8:57 AM 01/01/2024    9:34 AM 08/24/2023    9:07 AM 06/04/2023    4:48 PM 04/03/2023   10:56 AM 08/19/2022    8:52 AM 06/27/2022    7:58 AM  Advanced Directives  Does Patient Have a Medical Advance Directive? Yes Yes Yes Yes Yes Yes Yes  Type of Advance Directive Out of facility DNR (pink MOST or yellow form) Healthcare Power of [place];Living will;Out of facility DNR (pink MOST or yellow form) Healthcare Power of [place];Living will Out of facility DNR (pink MOST or yellow form) Living will Out of facility DNR (pink MOST or yellow form) Out of facility DNR (pink MOST or yellow form)  Does patient want to make changes to medical advance directive? No - Patient declined No - Patient declined Yes (Inpatient - patient defers changing a medical advance directive at this time - Information given) No - Patient declined Yes (ED - Information included in AVS) No - Patient declined No - Patient declined  Copy of Healthcare Power of Attorney in Chart?  Yes - validated most recent copy scanned in chart (See row information) Yes - validated most recent copy scanned in chart (See row information)      Pre-existing out of facility DNR order (yellow form or pink MOST form) Yellow form placed in chart (order not valid for inpatient use);Pink MOST form placed in chart (order not valid for inpatient use) Yellow form placed in chart (order not valid for inpatient use)  Yellow form placed in chart (order not valid  for inpatient use);Pink MOST form placed in chart (order not valid for inpatient use)  Pink MOST form placed in chart (order not valid for inpatient use);Yellow form placed in chart (order not valid for inpatient use) Yellow form placed in chart (order not valid for inpatient use);Pink MOST form placed in chart (order not valid for inpatient use)    Current Medications (verified) Outpatient Encounter Medications as of 08/26/2024  Medication Sig   denosumab  (PROLIA ) 60 MG/ML SOSY injection Inject 60 mg into the skin every 6 (six) months.   feeding supplement (BOOST HIGH PROTEIN) LIQD Take 2 Containers by mouth daily.   lisinopril  (ZESTRIL ) 10 MG tablet TAKE 1 TABLET(10 MG) BY MOUTH DAILY   mirtazapine  (REMERON ) 7.5 MG tablet Take 1 tablet (7.5 mg total) by mouth at bedtime.   simvastatin  (ZOCOR ) 20 MG tablet Take 1 tablet (20 mg total) by mouth daily.   Facility-Administered Encounter Medications as of 08/26/2024  Medication   [START ON 08/29/2024] denosumab  (PROLIA ) injection 60 mg    Allergies (verified) Patient has no known allergies.   History: Past Medical History:  Diagnosis Date   History of skin cancer    Dr.John Hall   Hyperlipidemia    Hypertension    Osteoporosis    Past Surgical History:  Procedure Laterality Date   ABDOMINAL HYSTERECTOMY  1998   Family History  Problem Relation  Age of Onset   Cancer Father    Dementia Sister    Breast cancer Maternal Grandmother    Breast cancer Paternal Grandmother    Social History   Socioeconomic History   Marital status: Single    Spouse name: Not on file   Number of children: Not on file   Years of education: Not on file   Highest education level: Not on file  Occupational History   Not on file  Tobacco Use   Smoking status: Former    Current packs/day: 0.00    Types: Cigarettes    Quit date: 12/08/2002    Years since quitting: 21.7   Smokeless tobacco: Never   Tobacco comments:    Quit at age 82   Vaping Use    Vaping status: Never Used  Substance and Sexual Activity   Alcohol use: No    Alcohol/week: 0.0 standard drinks of alcohol   Drug use: No   Sexual activity: Not Currently  Other Topics Concern   Not on file  Social History Narrative   ** Merged History Encounter **       Social Drivers of Health   Financial Resource Strain: Low Risk  (06/04/2018)   Overall Financial Resource Strain (CARDIA)    Difficulty of Paying Living Expenses: Not hard at all  Food Insecurity: No Food Insecurity (06/04/2018)   Hunger Vital Sign    Worried About Running Out of Food in the Last Year: Never true    Ran Out of Food in the Last Year: Never true  Transportation Needs: No Transportation Needs (06/04/2018)   PRAPARE - Administrator, Civil Service (Medical): No    Lack of Transportation (Non-Medical): No  Physical Activity: Insufficiently Active (06/04/2018)   Exercise Vital Sign    Days of Exercise per Week: 3 days    Minutes of Exercise per Session: 30 min  Stress: No Stress Concern Present (06/04/2018)   [employer] of Occupational Health - Occupational Stress Questionnaire    Feeling of Stress : Not at all  Social Connections: Moderately Isolated (06/04/2018)   Social Connection and Isolation Panel    Frequency of Communication with Friends and Family: Three times a week    Frequency of Social Gatherings with Friends and Family: Three times a week    Attends Religious Services: Never    Active Member of Clubs or Organizations: No    Attends Banker Meetings: Never    Marital Status: Never married    Tobacco Counseling Counseling given: Not Answered Tobacco comments: Quit at age 27    Clinical Intake:     Pain : No/denies pain     BMI - recorded: 16 Nutritional Status: BMI <19  Underweight Nutritional Risks: Unintentional weight loss  How often do you need to have someone help you when you read instructions, pamphlets, or other written materials from  your doctor or pharmacy?: 1 - Never         Activities of Daily Living    08/26/2024    9:13 AM  In your present state of health, do you have any difficulty performing the following activities:  Hearing? 1  Vision? 0  Difficulty concentrating or making decisions? 0  Walking or climbing stairs? 1  Dressing or bathing? 0  Doing errands, shopping? 1  Comment does not drive  Preparing Food and eating ? N  Using the Toilet? N  In the past six months, have you accidently leaked urine? N  Do you have problems with loss of bowel control? N  Managing your Medications? N  Managing your Finances? Y  Housekeeping or managing your Housekeeping? Y    Patient Care Team: Caro Harlene POUR, NP as PCP - General (Geriatric Medicine)  Indicate any recent Medical Services you may have received from other than [facility] providers in the past year (date may be approximate).     Assessment:   This is a routine wellness examination for [place].  Hearing/Vision screen Hearing Screening - Comments:: Decreased hearing, seeing an audiologist.  Vision Screening - Comments:: Last eye exam less than 12 months ago with [facility] in [facility]    Goals Addressed   None    Depression Screen    08/26/2024    8:57 AM 01/01/2024    9:33 AM 08/24/2023    9:08 AM 06/05/2023    9:27 AM 11/03/2022    8:47 AM 08/19/2022    8:48 AM 06/27/2022    8:56 AM  PHQ 2/9 Scores  PHQ - 2 Score 0 1 0 0 0 0 0    Fall Risk    08/26/2024    8:57 AM 01/01/2024    9:33 AM 08/24/2023    9:08 AM 06/05/2023    9:26 AM 11/03/2022    8:47 AM  Fall Risk   Falls in the past year? 0 0 0 1 0  Number falls in past yr: 0 0  0   Injury with Fall? 0 0  0   Risk for fall due to : History of fall(s)   No Fall Risks   Follow up Falls evaluation completed   Falls evaluation completed     MEDICARE RISK AT HOME: Medicare Risk at Home Any stairs in or around the home?: Yes If so, are there any without handrails?: No Home free  of loose throw rugs in walkways, pet beds, electrical cords, etc?: Yes Adequate lighting in your home to reduce risk of falls?: No Life alert?: Yes Use of a cane, walker or w/c?: Yes Grab bars in the bathroom?: No Shower chair or bench in shower?: Yes Elevated toilet seat or a handicapped toilet?: Yes  TIMED UP AND GO:  Was the test performed?  No    Cognitive Function:    08/24/2023    9:20 AM 08/07/2021    9:38 AM 06/14/2019    8:39 AM 06/04/2018    9:23 AM 05/27/2017   10:04 AM  MMSE - Mini Mental State Exam  Orientation to time 4 5 5 5 5    Orientation to Place 5 5 4 5 5    Registration 3 3 3 3 3    Attention/ Calculation 5 2 2 3  0   Recall 1 2 2 2 3    Language- name 2 objects 2 2 2 2 2    Language- repeat 1 1 1 1 1   Language- follow 3 step command 3 3 3 3 3    Language- read & follow direction 1 1 1 1 1    Write a sentence 1 1 1 1 1    Copy design 0 0 0 0 0   Total score 26 25 24 26 24       Data saved with a previous flowsheet row definition        08/26/2024    9:02 AM 08/19/2022    8:53 AM 06/14/2020    9:40 AM  6CIT Screen  What Year? 4 points 4 points 0 points  What month? 0 points 0 points  0 points  What time? 0 points 0 points 0 points  Count back from 20 0 points 0 points 4 points  Months in reverse 4 points 4 points 4 points  Repeat phrase 8 points 10 points 10 points  Total Score 16 points 18 points 18 points    Immunizations Immunization History  Administered Date(s) Administered   Fluad Quad(high Dose 65+) 10/17/2019, 10/19/2020, 08/07/2021, 11/03/2022   Fluad Trivalent(High Dose 65+) 08/24/2023   INFLUENZA, HIGH DOSE SEASONAL PF 08/26/2024   Influenza Whole 12/24/2006   Influenza,inj,Quad PF,6+ Mos 01/30/2015, 10/02/2017, 08/06/2018   Influenza-Unspecified 09/18/2011, 08/26/2013, 09/29/2016   PFIZER(Purple Top)SARS-COV-2 Vaccination 01/14/2020, 02/04/2020, 10/06/2020   Pneumococcal Conjugate-13 07/25/2014, 04/24/2015   Pneumococcal Polysaccharide-23  12/08/1996, 10/16/2011   Td 11/05/2005   Tdap 10/16/2011   Zoster, Live 04/24/2015    TDAP status: Due, Education has been provided regarding the importance of this vaccine. Advised may receive this vaccine at local pharmacy or Health Dept. Aware to provide a copy of the vaccination record if obtained from local pharmacy or Health Dept. Verbalized acceptance and understanding.  Flu Vaccine status: Due, Education has been provided regarding the importance of this vaccine. Advised may receive this vaccine at local pharmacy or Health Dept. Aware to provide a copy of the vaccination record if obtained from local pharmacy or Health Dept. Verbalized acceptance and understanding.  Pneumococcal vaccine status: Up to date  Covid-19 vaccine status: Information provided on how to obtain vaccines.   Qualifies for Shingles Vaccine? Yes   Zostavax completed No   Shingrix Completed?: No.    Education has been provided regarding the importance of this vaccine. Patient has been advised to call insurance company to determine out of pocket expense if they have not yet received this vaccine. Advised may also receive vaccine at local pharmacy or Health Dept. Verbalized acceptance and understanding.  Screening Tests Health Maintenance  Topic Date Due   COVID-19 Vaccine (4 - 2025-26 season) 08/08/2024   DTaP/Tdap/Td (3 - Td or Tdap) 10/13/2024 (Originally 10/15/2021)   DEXA SCAN  12/08/2024 (Originally 12/19/2023)   Zoster Vaccines- Shingrix (1 of 2) 12/08/2024 (Originally 06/27/1950)   Medicare Annual Wellness (AWV)  08/26/2025   Pneumococcal Vaccine: 50+ Years  Completed   Influenza Vaccine  Completed   HPV VACCINES  Aged Out   Meningococcal B Vaccine  Aged Out    Health Maintenance  Health Maintenance Due  Topic Date Due   COVID-19 Vaccine (4 - 2025-26 season) 08/08/2024    Colorectal cancer screening: No longer required.   Mammogram status: No longer required due to age.  Declines bone density    Lung Cancer Screening: (Low Dose CT Chest recommended if Age 58-80 years, 20 pack-year currently smoking OR have quit w/in 15years.) does not qualify.   Lung Cancer Screening Referral: na  Additional Screening:  Hepatitis C Screening: does not qualify;  Vision Screening: Recommended annual ophthalmology exams for early detection of glaucoma and other disorders of the eye. Is the patient up to date with their annual eye exam?  Yes  Who is the provider or what is the name of the office in which the patient attends annual eye exams? [facility] pt is not established with a provider, would they like to be referred to a provider to establish care? No .   Dental Screening: Recommended annual dental exams for proper oral hygiene   Community Resource Referral / Chronic Care Management: CRR required this visit?  No   CCM required this visit?  No     Plan:     I have personally reviewed and noted the following in the patient's chart:   Medical and social history Use of alcohol, tobacco or illicit drugs  Current medications and supplements including opioid prescriptions. Patient is not currently taking opioid prescriptions. Functional ability and status Nutritional status Physical activity Advanced directives List of other physicians Hospitalizations, surgeries, and ER visits in previous 12 months Vitals Screenings to include cognitive, depression, and falls Referrals and appointments  In addition, I have reviewed and discussed with patient certain preventive protocols, quality metrics, and best practice recommendations. A written personalized care plan for preventive services as well as general preventive health recommendations were provided to patient.     Harlene MARLA An, NP   08/26/2024

## 2024-08-26 NOTE — Patient Instructions (Signed)
  Jasmine Fletcher , Thank you for taking time to come for your Medicare Wellness Visit. I appreciate your ongoing commitment to your health goals. Please review the following plan we discussed and let me know if I can assist you in the future.   RSV, COVID shot at pharmacy  If you want shingles and TDAP    This is a list of the screening recommended for you and due dates:  Health Maintenance  Topic Date Due   COVID-19 Vaccine (4 - 2025-26 season) 08/08/2024   DTaP/Tdap/Td vaccine (3 - Td or Tdap) 10/13/2024*   DEXA scan (bone density measurement)  12/08/2024*   Zoster (Shingles) Vaccine (1 of 2) 12/08/2024*   Medicare Annual Wellness Visit  08/26/2025   Pneumococcal Vaccine for age over 60  Completed   Flu Shot  Completed   HPV Vaccine  Aged Out   Meningitis B Vaccine  Aged Out  *Topic was postponed. The date shown is not the original due date.

## 2024-08-29 ENCOUNTER — Ambulatory Visit

## 2024-09-06 ENCOUNTER — Telehealth: Payer: Self-pay

## 2024-09-06 NOTE — Telephone Encounter (Signed)
 Noted

## 2024-09-06 NOTE — Telephone Encounter (Signed)
 Copied from CRM [id number]. Topic: Clinical - Order For Equipment >> Sep 06, 2024  4:07 PM Zane F wrote: Reason for CRM:   Equipment Needed: Ramp to the patient's front door  Reason for Equipment: To ensure it is easier for the patient to access her home.   Patient daughter, Heron, was given a form by  [employer] to have the patient's PCP fill out in order to have the ramp installed. Patient's daughter is going to drop off the document in a sealed envelope to the office to be delivered to the provider's inbox for completion.    Once received please ensure documentation is filled out and returned back to patient's daughter by calling her to pick up document.  Best Callback Number: [phone]

## 2024-09-07 ENCOUNTER — Telehealth: Payer: Self-pay | Admitting: Nurse Practitioner

## 2024-09-07 NOTE — Telephone Encounter (Signed)
 Patient daughter Heron came by dropped off forms to be completed by PCP. She was informed that Harlene is out the office but she stated that any other provider can complete it. Given to Jasmione in CI

## 2024-09-27 ENCOUNTER — Telehealth: Payer: Self-pay

## 2024-09-27 NOTE — Telephone Encounter (Signed)
 I have not seen the forms, can you follow up with that.

## 2024-09-27 NOTE — Telephone Encounter (Signed)
 The patient's daughter called to follow up on forms. Myles Raisin just returned, and I will ask that she review the form on Friday.

## 2024-09-27 NOTE — Telephone Encounter (Signed)
 Copied from CRM [id number]. Topic: Clinical - Order For Equipment >> Sep 06, 2024  4:07 PM Zane F wrote: Reason for CRM:   Equipment Needed: Ramp to the patient's front door  Reason for Equipment: To ensure it is easier for the patient to access her home.   Patient daughter, Heron, was given a form by  [employer] to have the patient's PCP fill out in order to have the ramp installed. Patient's daughter is going to drop off the document in a sealed envelope to the office to be delivered to the provider's inbox for completion.    Once received please ensure documentation is filled out and returned back to patient's daughter by calling her to pick up document.  Best Callback Number: [phone] >> Sep 27, 2024  9:57 AM Alfonso ORN wrote:   Per CAL Harlene just back in the office on Monday and form is not completed , Harlene is in the office only on mondays and fridays , will reach out to daughter when completed  Daughter callback #  [phone]  >> Sep 27, 2024  9:49 AM Alfonso ORN wrote: Heron pacini daughter calling on the status of form dropped off about a first part of this month a form to be completed for a ramp for patient to be installed

## 2024-10-03 ENCOUNTER — Telehealth: Payer: Self-pay

## 2024-10-03 NOTE — Telephone Encounter (Signed)
 Spoke with patient daughter this morning in regards to form completion, patient daughter is going to pick it the form documentation from  [employer] to have the patient's Caro Harlene POUR, NP  fill out in order to have the ramp installed.

## 2024-10-04 ENCOUNTER — Other Ambulatory Visit: Payer: Self-pay | Admitting: Nurse Practitioner

## 2024-10-04 DIAGNOSIS — R634 Abnormal weight loss: Secondary | ICD-10-CM

## 2024-10-10 ENCOUNTER — Ambulatory Visit: Payer: Self-pay | Admitting: Nurse Practitioner

## 2024-10-10 ENCOUNTER — Encounter: Payer: Self-pay | Admitting: Nurse Practitioner

## 2024-10-10 VITALS — BP 128/70 | HR 57 | Temp 97.6°F | Ht 69.0 in | Wt 113.0 lb

## 2024-10-10 DIAGNOSIS — R634 Abnormal weight loss: Secondary | ICD-10-CM | POA: Diagnosis not present

## 2024-10-10 NOTE — Progress Notes (Signed)
 Careteam: Patient Care Team: Caro Harlene POUR, NP as PCP - General (Geriatric Medicine)  PLACE OF SERVICE:  [facility]  Advanced Directive information    No Known Allergies  Chief Complaint  Patient presents with   Medical Management of Chronic Issues    2 month follow up regarding weight  Discuss COVID vaccine     HPI:  Discussed the use of AI scribe software for clinical note transcription with the patient, who gave verbal consent to proceed.  History of Present Illness Jasmine Fletcher is a 89 year old female who presents for a follow-up regarding weight management and nutritional intake.  She has been experiencing weight loss. Her weight has increased by three pounds since the last visit, and she has gained a total of four pounds since August 15, 2024. She eats regularly but finds it challenging to consume large quantities of food by herself. She is currently taking Remeron  to help with sleep and appetite, and she reports sleeping more at night with occasional improvement in appetite. She is also taking Boost nutritional supplement twice a day and consumes milk and juice regularly.  She mentions difficulty getting in and out of her chair due to its low height, which affects her ability to relax and elevate her legs. She uses a trash can to elevate her feet but lacks a recliner for proper support.  No nausea, vomiting, diarrhea, or bowel movement issues. No trouble with chewing or swallowing.    Review of Systems:  Review of Systems  Constitutional:  Negative for chills, fever and weight loss.  HENT:  Negative for tinnitus.   Respiratory:  Negative for cough, sputum production and shortness of breath.   Cardiovascular:  Negative for chest pain, palpitations and leg swelling.  Gastrointestinal:  Negative for abdominal pain, constipation, diarrhea and heartburn.  Genitourinary:  Negative for dysuria, frequency and urgency.  Musculoskeletal:  Negative for back pain,  falls, joint pain and myalgias.  Skin: Negative.   Neurological:  Negative for dizziness and headaches.  Psychiatric/Behavioral:  Negative for depression and memory loss. The patient does not have insomnia.     Past Medical History:  Diagnosis Date   History of skin cancer    Dr.John Hall   Hyperlipidemia    Hypertension    Osteoporosis    Past Surgical History:  Procedure Laterality Date   ABDOMINAL HYSTERECTOMY  1998   Social History:   reports that she quit smoking about 21 years ago. Her smoking use included cigarettes. She has never used smokeless tobacco. She reports that she does not drink alcohol and does not use drugs.  Family History  Problem Relation Age of Onset   Cancer Father    Dementia Sister    Breast cancer Maternal Grandmother    Breast cancer Paternal Grandmother     Medications: Patient's Medications  New Prescriptions   No medications on file  Previous Medications   DENOSUMAB  (PROLIA ) 60 MG/ML SOSY INJECTION    Inject 60 mg into the skin every 6 (six) months.   FEEDING SUPPLEMENT (BOOST HIGH PROTEIN) LIQD    Take 2 Containers by mouth daily.   LISINOPRIL  (ZESTRIL ) 10 MG TABLET    TAKE 1 TABLET(10 MG) BY MOUTH DAILY   MIRTAZAPINE  (REMERON ) 7.5 MG TABLET    TAKE 1 TABLET(7.5 MG) BY MOUTH AT BEDTIME   SIMVASTATIN  (ZOCOR ) 20 MG TABLET    Take 1 tablet (20 mg total) by mouth daily.  Modified Medications   No  medications on file  Discontinued Medications   No medications on file    Physical Exam:  Vitals:   10/10/24 0905  BP: 128/70  Pulse: (!) 57  Temp: 97.6 F (36.4 C)  SpO2: 98%  Weight: 113 lb (51.3 kg)  Height: 5' 9 (1.753 m)   Body mass index is 16.69 kg/m. Wt Readings from Last 3 Encounters:  10/10/24 113 lb (51.3 kg)  08/26/24 110 lb 3.2 oz (50 kg)  08/15/24 109 lb 9.6 oz (49.7 kg)    Physical Exam Constitutional:      General: She is not in acute distress.    Appearance: She is well-developed. She is not diaphoretic.   HENT:     Head: Normocephalic and atraumatic.     Mouth/Throat:     Pharynx: No oropharyngeal exudate.  Eyes:     Conjunctiva/sclera: Conjunctivae normal.     Pupils: Pupils are equal, round, and reactive to light.  Cardiovascular:     Rate and Rhythm: Normal rate and regular rhythm.     Heart sounds: Normal heart sounds.  Pulmonary:     Effort: Pulmonary effort is normal.     Breath sounds: Normal breath sounds.  Abdominal:     General: Bowel sounds are normal.     Palpations: Abdomen is soft.  Musculoskeletal:     Cervical back: Normal range of motion and neck supple.     Right lower leg: No edema.     Left lower leg: No edema.  Skin:    General: Skin is warm and dry.  Neurological:     Mental Status: She is alert.     Motor: Weakness present.     Gait: Gait abnormal.  Psychiatric:        Mood and Affect: Mood normal.     Labs reviewed: Basic Metabolic Panel: Recent Labs    03/14/24 0900 07/11/24 0920 08/15/24 1030  NA 135 133* 134*  K 4.4 4.0 4.4  CL 105 103 105  CO2 28 28 27   GLUCOSE 84 75 80  BUN 18 20 16   CREATININE 0.92 0.92 0.79  CALCIUM  9.6 9.9 9.2   Liver Function Tests: Recent Labs    03/14/24 0900 07/11/24 0920 08/15/24 1030  AST 13* 14* 15  ALT 6 6 7   ALKPHOS 45 29* 34*  BILITOT 0.3 0.2 0.2  PROT 11.1* 11.0* 11.6*  ALBUMIN 3.2* 3.1* 3.3*   No results for input(s): LIPASE, AMYLASE in the last 8760 hours. No results for input(s): AMMONIA in the last 8760 hours. CBC: Recent Labs    03/14/24 0900 07/11/24 0920 08/15/24 1030  WBC 3.6* 3.4* 3.3*  NEUTROABS 1.9 1.8 1.6*  HGB 9.9* 9.1* 9.7*  HCT 31.7* 29.0* 30.8*  MCV 85.4 84.8 84.6  PLT 207 178 176   Lipid Panel: Recent Labs    01/01/24 1021  CHOL 127  HDL 49*  LDLCALC 61  TRIG 87  CHOLHDL 2.6   TSH: No results for input(s): TSH in the last 8760 hours. A1C: No results found for: HGBA1C   Assessment/Plan  Assessment & Plan weight loss weight gain noted  with increased appetite and improved sleep on Remeron . - Continue Remeron  as prescribed. - Continue Boost nutritional supplement twice daily.  Mobility issues Difficulty with chair due to low height. - Contact [employer] [facility] for assistance with recliner chair. - Provide documentation if required by insurance.  General Health Maintenance Prolia  injection due in four months; labs needed one month prior. - Schedule  Prolia  injection in four months. - Ensure labs are done within a month prior to Prolia  injection. - Coordinate lab work and Prolia  injection timing with [place].    Return in about 4 months (around 02/07/2025) for routine follow up and prolia  .:  Jamol Ginyard K. Caro BODILY [facility] [phone]

## 2024-10-31 ENCOUNTER — Inpatient Hospital Stay: Admitting: Hematology

## 2024-10-31 NOTE — Progress Notes (Incomplete)
 HEMATOLOGY ONCOLOGY PROGRESS NOTE  Date of service: 10/31/2024  Patient Care Team: Caro Harlene POUR, NP as PCP - General (Geriatric Medicine)  CHIEF COMPLAINT/PURPOSE OF CONSULTATION: Follow-up for continued evaluation and management of smoldering myeloma with concerns for progression of active disease.  HISTORY OF PRESENTING ILLNESS: Jasmine Fletcher is a wonderful 89 y.o. female who has been referred to us  by Caro Harlene POUR, NP for evaluation and management of elevated serum globulin levels. The pt reports that she is doing well overall.   The pt reports that her appetite has decreased recently but that she eats a healthy diet. She notes mild fatigue. Denies new bone pain, back pain, hip pain, infection concerns, and any other new concerns.   She has been getting a Prolia  shot every 6 months for the past 2 years. She has also been taking Aricept  but notes that her memory is worsening. Her daughter is her health care POA, but the pt makes her own healthcare decisions at this time.   Most recent lab results (06/14/2019) of CBC is as follows: all values are WNL except for HGB at 11.4, MCH at 26.8. 06/21/2019 UPEP revealed protein/creat ratio at 209, total protein at 29, and two abnormal protein bands 06/14/2019 SPEP revealed total protein at 10.1, Alpha 2 at 1.0, gamma globulin at 4.1, abnormal protein band1 at 3.9.  INTERVAL HISTORY: I connected with Jasmine Fletcher on 10/31/2024 at  8:40 AM EST by telephone and verified that I am speaking with the correct person using two identifiers.   I discussed the limitations, risks, security and privacy concerns of performing an evaluation and management service by telemedicine and the availability of in-person appointments. I also discussed with the patient that there may be a patient responsible charge related to this service. The patient expressed understanding and agreed to proceed.   Other persons participating in the visit and their role  in the encounter: Medical Scribe, Damien Blanks, and {[email or web address]:[id number]}  Patient's location: Home Provider's location: [facility]   Chief Complaint: continued evaluation and management of smoldering myeloma with concerns for progression of active disease.  I last connected with her on 08/15/2024; at the time she  mentioned experiencing some increase fatigue and arthritic pain.   Today, she  Denies any  []  new infection issues,  []  fevers/chills,  []  drenching night sweats,  []  unexpected weight change,  []  back pain,  []  chest pain,  []  abdominal pain,  []  new bone pains,  []  new lumps/bumps,  []  leg swelling,  []  bleeding issues (nose bleeds, gum bleeds, abnormal/spontaneous bruising),  []  nausea/vomiting,  []  diarrhea,  []  bowel/urinary changes, []  SOB,  []  change in breathing     REVIEW OF SYSTEMS:   10 Point review of systems of done and is negative except as noted above.  MEDICAL HISTORY Past Medical History:  Diagnosis Date   History of skin cancer    Dr.John Hall   Hyperlipidemia    Hypertension    Osteoporosis     SURGICAL HISTORY Past Surgical History:  Procedure Laterality Date   ABDOMINAL HYSTERECTOMY  1998    SOCIAL HISTORY Social History   Tobacco Use   Smoking status: Former    Current packs/day: 0.00    Types: Cigarettes    Quit date: 12/08/2002    Years since quitting: 21.9   Smokeless tobacco: Never   Tobacco comments:    Quit at age 89   Vaping Use   Vaping status: Never Used  Substance  Use Topics   Alcohol use: No    Alcohol/week: 0.0 standard drinks of alcohol   Drug use: No    Social History   Social History Narrative   ** Merged History Encounter **        SOCIAL DRIVERS OF HEALTH SDOH Screenings   Food Insecurity: No Food Insecurity (06/04/2018)  Transportation Needs: No Transportation Needs (06/04/2018)  Alcohol Screen: Low Risk  (08/06/2018)  Depression (PHQ2-9): Low Risk  (08/26/2024)  Financial Resource Strain: Low Risk   (06/04/2018)  Physical Activity: Insufficiently Active (06/04/2018)  Social Connections: Moderately Isolated (06/04/2018)  Stress: No Stress Concern Present (06/04/2018)  Tobacco Use: Medium Risk (10/10/2024)     FAMILY HISTORY Family History  Problem Relation Age of Onset   Cancer Father    Dementia Sister    Breast cancer Maternal Grandmother    Breast cancer Paternal Grandmother      ALLERGIES: has no known allergies.  MEDICATIONS  Current Outpatient Medications  Medication Sig Dispense Refill   denosumab  (PROLIA ) 60 MG/ML SOSY injection Inject 60 mg into the skin every 6 (six) months. 60 mL 0   feeding supplement (BOOST HIGH PROTEIN) LIQD Take 2 Containers by mouth daily.     lisinopril  (ZESTRIL ) 10 MG tablet TAKE 1 TABLET(10 MG) BY MOUTH DAILY 90 tablet 1   mirtazapine  (REMERON ) 7.5 MG tablet TAKE 1 TABLET(7.5 MG) BY MOUTH AT BEDTIME 30 tablet 1   simvastatin  (ZOCOR ) 20 MG tablet Take 1 tablet (20 mg total) by mouth daily. 90 tablet 1   Current Facility-Administered Medications  Medication Dose Route Frequency Provider Last Rate Last Admin   denosumab  (PROLIA ) injection 60 mg  60 mg Subcutaneous Q6 months Eubanks, Jessica K, NP        PHYSICAL EXAMINATION TELEPHONE VISIT:  GENERAL: sounds alert, in no acute distress and comfortable PSYCH: sounds alert & oriented x 3 with fluent speech  LABORATORY DATA:   I have reviewed the data as listed     Latest Ref Rng & Units 08/15/2024   10:30 AM 07/11/2024    9:20 AM 03/14/2024    9:00 AM  CBC EXTENDED  WBC 4.0 - 10.5 K/uL 3.3  3.4  3.6   RBC 3.87 - 5.11 MIL/uL 3.64  3.42  3.71   Hemoglobin 12.0 - 15.0 g/dL 9.7  9.1  9.9   HCT 63.9 - 46.0 % 30.8  29.0  31.7   Platelets 150 - 400 K/uL 176  178  207   NEUT# 1.7 - 7.7 K/uL 1.6  1.8  1.9   Lymph# 0.7 - 4.0 K/uL 1.1  1.2  1.1    Iron Studies:  Lab Results  Component Value Date   IRON 39 08/15/2024   UIBC 172 08/15/2024   TIBC 211 (L) 08/15/2024   IRONPCTSAT 18 08/15/2024    FERRITIN 145 08/15/2024   Component     Latest Ref Rng 08/15/2024  Erythropoietin      2.6 - 18.5 mIU/mL 9.5   Beta-2  Microglobulin     0.6 - 2.4 mg/L 3.7 (H)      LDH:  Lab Results  Component Value Date   LDH 110 08/15/2024       Latest Ref Rng & Units 08/15/2024   10:30 AM 07/11/2024    9:20 AM 03/14/2024    9:00 AM  CMP  Glucose 70 - 99 mg/dL 80  75  84   BUN 8 - 23 mg/dL 16  20  18    Creatinine 0.44 -  1.00 mg/dL 9.20  9.07  9.07   Sodium 135 - 145 mmol/L 134  133  135   Potassium 3.5 - 5.1 mmol/L 4.4  4.0  4.4   Chloride 98 - 111 mmol/L 105  103  105   CO2 22 - 32 mmol/L 27  28  28    Calcium  8.9 - 10.3 mg/dL 9.2  9.9  9.6   Total Protein 6.5 - 8.1 g/dL 88.3  88.9  88.8   Total Bilirubin 0.0 - 1.2 mg/dL 0.2  0.2  0.3   Alkaline Phos 38 - 126 U/L 34  29  45   AST 15 - 41 U/L 15  14  13    ALT 0 - 44 U/L 7  6  6     MULTIPLE MYELOMA 01/2023 - 08/2024 & KAPPA/LAMBDA LIGHT CHAINS 07/2019 - 08/2024     09/02/2019 Bone Marrow Biopsy ([id number])    08/05/2019 Bone Marrow Biopsy ([id number])            RADIOGRAPHIC STUDIES: I have personally reviewed the radiological images as listed and agreed with the findings in the report. NM PET Image Initial (PI) Whole Body (F-18 FDG) Result Date: 08/16/2024 CLINICAL DATA:  Subsequent treatment strategy for multiple myeloma. EXAM: NUCLEAR MEDICINE PET WHOLE BODY TECHNIQUE: 5.5 mCi F-18 FDG was injected intravenously. Full-ring PET imaging was performed from the head to foot after the radiotracer. CT data was obtained and used for attenuation correction and anatomic localization. Fasting blood glucose: 77 mg/dl COMPARISON:  91/68/7979. FINDINGS: Mediastinal blood pool activity: SUV max 2.4 HEAD/NECK: No abnormal hypermetabolism. Incidental CT findings: None. CHEST: Hypermetabolic right thyroid  nodules with index 2.9 cm nodule, SUV max 11.2. Similar metabolism in the subcarinal station and both hila. No additional abnormal hypermetabolism.  Incidental CT findings: Atherosclerotic calcification of the aorta, aortic valve and coronary arteries. Heart is enlarged. Scarring in the posterior left upper lobe and superior segment left lower lobe. ABDOMEN/PELVIS: Mildly hypermetabolic bilateral inguinal nodes. Index lymph node on the right measures 8 mm (4/196), SUV max 3.1. Findings are stable from 08/08/2019. No additional abnormal hypermetabolism. Incidental CT findings: Small hepatic cysts. High and low-attenuation lesions in the kidneys, difficult to further characterize without IV contrast. SKELETON: No abnormal hypermetabolism. Incidental CT findings: Degenerative changes in the spine. EXTREMITIES: No abnormal hypermetabolism. Incidental CT findings: None. IMPRESSION: 1. No evidence of metabolically active multiple myeloma. 2. Chronically hypermetabolic mediastinal, hilar and inguinal lymph nodes. 3. Right lower leg right thyroid  nodules. Recommend thyroid  US  and biopsy. (Ref: J Am Coll Radiol. 2015 Feb;12(2): 143-50). 4. Aortic atherosclerosis (ICD10-I70.0). Coronary artery calcification. Electronically Signed   By: Newell Eke M.D.   On: 08/16/2024 14:12     ASSESSMENT & PLAN:  89 y.o. female with  #1 IgG Kappa Monoclonal paraproteinemia with M protein of 3.9 -highly concerning for MM   06/14/2019 SPEP revealed total protein at 10.1, alpha 2 at 1.0, gamma globulin at 4.1, Abnormal protein band1 at 3.9 UPEP +ve for Bence Jones protein   08/08/2019 PET scan with results revealing Two right thyroid  nodules are both highly hypermetabolic. A significant minority of hypermetabolic thyroid  lesions represent thyroid  malignancy. Further workup with thyroid  ultrasound is recommended. A small left upper para-aortic lymph node is mildly hypermetabolic with maximum SUV 4.7, significance uncertain. No significant abnormal accentuated osseous activity or compelling focal findings of active multiple myeloma in the skeleton. Hypodense lesions in the  liver are photopenic and likely benign Hyperdense small lesions of the left kidney upper pole  are too small to characterize but statistically most likely to be complex cysts. If the patient has hematuria or if otherwise clinically warranted, renal protocol MRI with and without contrast could be utilized for further characterization. Mild mid to distal accentuated thoracic esophageal activity with maximum SUV of 4.8; mildly accentuated activity in the gastric cardia/gastroesophageal junction with maximum SUV 5.8. Both could be physiologic but rarely low-grade malignancy could have a similar appearance, upper endoscopy could be utilized for further workup if clinically warranted. Other imaging findings of potential clinical significance: Chronic ischemic microvascular white matter disease intracranially. Aortic Atherosclerosis (ICD10-I70.0). Mild cardiomegaly. Biapical pleuroparenchymal scarring and probable scarring in the superior segment left lower lobe. Calcified uterine fibroids. Thoracolumbar scoliosis. No clear bone lesions.   08/05/2019 bone marrow biopsy ([id number]) with results revealing no monoclonal b cell population or abnormal T cell phenotype identified. Atypical lymphoplasmacytoid aggregates - not clear for myeloma   09/02/2019 bone marrow biopsy ([id number]) with results revealing small popoulation of monoclonal plasma cell. Does not appear to co-related with M protein levels and seem to be underrepresented.   #2. FDG avid thyroid  nodules   #3.  Patient Active Problem List   Diagnosis Date Noted   Weight loss 08/05/2024   Multiple myeloma not having achieved remission (HCC) 02/10/2022   Depression, major, single episode, complete remission 02/13/2020   Smoldering myeloma 10/17/2019   Vitamin D  deficiency 10/17/2019   Dementia without behavioral disturbance (HCC) 07/01/2017   Mixed hyperlipidemia 11/29/2013   Osteoporosis 03/15/2013   BRADYCARDIA 02/17/2009   HYPERTENSION,  BENIGN ESSENTIAL 02/16/2009   ANEMIA, HYPOCHROMIC 07/26/2007   DISORDER, TOBACCO USE 07/26/2007   DEPRESSION 07/26/2007   CARDIAC CATHETERIZATION, LEFT, HX OF 07/26/2007   PLAN: - Discussed lab results on 08/15/2024 in detail with patient: CBC showed WBC of 3.3 decreased from 3.4, Hemoglobin of 9.7 increased from 9.1, and PLTs of 176K. CMP with Total Protein 11. 6 elevated from 11.  M protein 4.7 Kappa/Lambda Lights Chains with elevated Kappa Light Chains and Ratio Iron Panel and Ferritin: Iron% 18 and Ferritin 145 Erythropoietin  9.5 Beta-2  Microglobulin 3.7 LDH 110  - Reviewed 08/16/2024 PET scan:  1. No evidence of metabolically active multiple myeloma.  2. Chronically hypermetabolic mediastinal, hilar and inguinal lymph nodes.  3. Right lower leg right thyroid  nodules.  4. Aortic atherosclerosis (ICD10-I70.0). Coronary artery calcification.  FOLLOW-UP in {WEEKS/MONTHS:[id number]} for labs and follow-up with Dr. Onesimo.  The total time spent in the appointment was *** minutes* .  All of the patient's questions were answered and the patient knows to call the clinic with any problems, questions, or concerns.  Emaline Onesimo MD MS AAHIVMS [facility] Hematology/Oncology Physician [facility]  *Total Encounter Time as defined by the Centers for Medicare and Medicaid Services includes, in addition to the face-to-face time of a patient visit (documented in the note above) non-face-to-face time: obtaining and reviewing outside history, ordering and reviewing medications, tests or procedures, care coordination (communications with other health care professionals or caregivers) and documentation in the medical record.  I,Emily Lagle,acting as a neurosurgeon for Emaline Onesimo, MD.,have documented all relevant documentation on the behalf of Emaline Onesimo, MD,as directed by  Emaline Onesimo, MD while in the presence of Emaline Onesimo, MD.  I have reviewed the above documentation for accuracy and  completeness, and I agree with the above.  Gautam Kale, MD

## 2024-11-01 ENCOUNTER — Inpatient Hospital Stay: Attending: Hematology

## 2024-11-01 ENCOUNTER — Other Ambulatory Visit: Payer: Self-pay

## 2024-11-01 DIAGNOSIS — D649 Anemia, unspecified: Secondary | ICD-10-CM | POA: Diagnosis not present

## 2024-11-01 DIAGNOSIS — C9000 Multiple myeloma not having achieved remission: Secondary | ICD-10-CM

## 2024-11-01 DIAGNOSIS — D472 Monoclonal gammopathy: Secondary | ICD-10-CM | POA: Insufficient documentation

## 2024-11-01 LAB — CBC WITH DIFFERENTIAL (CANCER CENTER ONLY)
Abs Immature Granulocytes: 0 K/uL (ref 0.00–0.07)
Basophils Absolute: 0 K/uL (ref 0.0–0.1)
Basophils Relative: 1 %
Eosinophils Absolute: 0 K/uL (ref 0.0–0.5)
Eosinophils Relative: 0 %
HCT: 28.3 % — ABNORMAL LOW (ref 36.0–46.0)
Hemoglobin: 8.8 g/dL — ABNORMAL LOW (ref 12.0–15.0)
Immature Granulocytes: 0 %
Lymphocytes Relative: 30 %
Lymphs Abs: 1 K/uL (ref 0.7–4.0)
MCH: 26.8 pg (ref 26.0–34.0)
MCHC: 31.1 g/dL (ref 30.0–36.0)
MCV: 86.3 fL (ref 80.0–100.0)
Monocytes Absolute: 0.4 K/uL (ref 0.1–1.0)
Monocytes Relative: 13 %
Neutro Abs: 1.8 K/uL (ref 1.7–7.7)
Neutrophils Relative %: 56 %
Platelet Count: 176 K/uL (ref 150–400)
RBC: 3.28 MIL/uL — ABNORMAL LOW (ref 3.87–5.11)
RDW: 14.5 % (ref 11.5–15.5)
WBC Count: 3.2 K/uL — ABNORMAL LOW (ref 4.0–10.5)
nRBC: 0 % (ref 0.0–0.2)

## 2024-11-01 LAB — CMP (CANCER CENTER ONLY)
ALT: 9 U/L — ABNORMAL LOW (ref 10–47)
AST: 19 U/L (ref 15–41)
Albumin: 3.4 g/dL — ABNORMAL LOW (ref 3.5–5.0)
Alkaline Phosphatase: 36 U/L — ABNORMAL LOW (ref 38–126)
Anion gap: 7 (ref 5–15)
BUN: 23 mg/dL (ref 8–23)
CO2: 25 mmol/L (ref 22–32)
Calcium: 10 mg/dL (ref 8.9–10.3)
Chloride: 104 mmol/L (ref 98–111)
Creatinine: 0.93 mg/dL (ref 0.60–1.20)
GFR, Estimated: 58 mL/min — ABNORMAL LOW (ref >60)
Glucose, Bld: 74 mg/dL (ref 70–99)
Potassium: 4.2 mmol/L (ref 3.5–5.1)
Sodium: 136 mmol/L (ref 135–145)
Total Protein: 11.7 g/dL — ABNORMAL HIGH (ref 6.5–8.1)

## 2024-11-01 LAB — IRON AND IRON BINDING CAPACITY (CC-WL,HP ONLY)
Iron: 40 ug/dL (ref 28–170)
Saturation Ratios: 17 % (ref 10.4–31.8)
TIBC: 237 ug/dL — ABNORMAL LOW (ref 250–450)
UIBC: 196 ug/dL (ref 148–442)

## 2024-11-01 LAB — FERRITIN: Ferritin: 115 ng/mL (ref 11–307)

## 2024-11-02 LAB — KAPPA/LAMBDA LIGHT CHAINS
Kappa free light chain: 470.4 mg/L — ABNORMAL HIGH (ref 3.3–19.4)
Kappa, lambda light chain ratio: 71.27 — ABNORMAL HIGH (ref 0.26–1.65)
Lambda free light chains: 6.6 mg/L (ref 5.7–26.3)

## 2024-11-02 LAB — ERYTHROPOIETIN: Erythropoietin: 13.9 m[IU]/mL (ref 2.6–18.5)

## 2024-11-06 LAB — MULTIPLE MYELOMA PANEL, SERUM
Albumin SerPl Elph-Mcnc: 4 g/dL (ref 2.9–4.4)
Albumin/Glob SerPl: 0.6 — ABNORMAL LOW (ref 0.7–1.7)
Alpha 1: 0.3 g/dL (ref 0.0–0.4)
Alpha2 Glob SerPl Elph-Mcnc: 0.7 g/dL (ref 0.4–1.0)
B-Globulin SerPl Elph-Mcnc: 0.9 g/dL (ref 0.7–1.3)
Gamma Glob SerPl Elph-Mcnc: 5.1 g/dL — ABNORMAL HIGH (ref 0.4–1.8)
Globulin, Total: 7.1 g/dL — ABNORMAL HIGH (ref 2.2–3.9)
IgA: 44 mg/dL — ABNORMAL LOW (ref 64–422)
IgG (Immunoglobin G), Serum: 7263 mg/dL — ABNORMAL HIGH (ref 586–1602)
IgM (Immunoglobulin M), Srm: 11 mg/dL — ABNORMAL LOW (ref 26–217)
M Protein SerPl Elph-Mcnc: 4.9 g/dL — ABNORMAL HIGH
Total Protein ELP: 11.1 g/dL — ABNORMAL HIGH (ref 6.0–8.5)

## 2024-11-07 ENCOUNTER — Inpatient Hospital Stay: Attending: Hematology | Admitting: Hematology

## 2024-11-07 DIAGNOSIS — E042 Nontoxic multinodular goiter: Secondary | ICD-10-CM | POA: Insufficient documentation

## 2024-11-07 DIAGNOSIS — D472 Monoclonal gammopathy: Secondary | ICD-10-CM | POA: Insufficient documentation

## 2024-11-07 DIAGNOSIS — C9000 Multiple myeloma not having achieved remission: Secondary | ICD-10-CM

## 2024-11-07 NOTE — Progress Notes (Signed)
 HEMATOLOGY ONCOLOGY PROGRESS NOTE  Date of service: .@ENCDATE @  Patient Care Team: Caro Harlene POUR, NP as PCP - General (Geriatric Medicine)  Diagnosis: Follow-up for continued evaluation and management of smoldering myeloma with concerns for progression of active disease.   Current Treatment: consideration of rpt BM BX and consideration of Dara faspro/Dex treatment.  SUMMARY OF ONCOLOGIC HISTORY: Oncology History   No history exists.    INTERVAL HISTORY:  I connected with Jasmine Fletcher on 11/07/2024 at  8:40 AM EST by telephone visit and verified that I am speaking with the correct person using two /identifiers.   I discussed the limitations, risks, security and privacy concerns of performing an evaluation and management service by telemedicine and the availability of in-person appointments. I also discussed with the patient that there may be a patient responsible charge related to this service. The patient expressed understanding and agreed to proceed.   Other persons participating in the visit and their role in the encounter: patients daughter   Patient's location: home  Provider's location: [facility]   Chief Complaint: Follow-up for continued evaluation and management of smoldering myeloma with concerns for progression of active disease.      Patient notes persistent fatigue grade 1-2 which remains unchanged at this time.  Reasonable p.o. intake.  No new focal bone pains.  No fevers no chills no night sweats no unexpected weight loss. Labs were discussed in detail and we discussed development of anemia and increasing M protein concerning for at least high risk smoldering myeloma versus active myeloma. I discussed and recommended bone marrow biopsy and consideration of initiating treatment.  Patient wants to talk to other family members and decide this over the holidays.      REVIEW OF SYSTEMS:    10 Point review of systems of done and is negative except as noted  above.  . Past Medical History:  Diagnosis Date   History of skin cancer    Dr.John Hall   Hyperlipidemia    Hypertension    Osteoporosis     . Past Surgical History:  Procedure Laterality Date   ABDOMINAL HYSTERECTOMY  1998    . Social History   Tobacco Use   Smoking status: Former    Current packs/day: 0.00    Types: Cigarettes    Quit date: 12/08/2002    Years since quitting: 21.9   Smokeless tobacco: Never   Tobacco comments:    Quit at age 47   Vaping Use   Vaping status: Never Used  Substance Use Topics   Alcohol use: No    Alcohol/week: 0.0 standard drinks of alcohol   Drug use: No    ALLERGIES:  has no known allergies.  MEDICATIONS:  Current Outpatient Medications  Medication Sig Dispense Refill   denosumab  (PROLIA ) 60 MG/ML SOSY injection Inject 60 mg into the skin every 6 (six) months. 60 mL 0   feeding supplement (BOOST HIGH PROTEIN) LIQD Take 2 Containers by mouth daily.     lisinopril  (ZESTRIL ) 10 MG tablet TAKE 1 TABLET(10 MG) BY MOUTH DAILY 90 tablet 1   mirtazapine  (REMERON ) 7.5 MG tablet TAKE 1 TABLET(7.5 MG) BY MOUTH AT BEDTIME 30 tablet 1   simvastatin  (ZOCOR ) 20 MG tablet Take 1 tablet (20 mg total) by mouth daily. 90 tablet 1   Current Facility-Administered Medications  Medication Dose Route Frequency Provider Last Rate Last Admin   denosumab  (PROLIA ) injection 60 mg  60 mg Subcutaneous Q6 months Eubanks, Jessica K, NP  PHYSICAL EXAMINATION telemedicine visit LABORATORY DATA:   I have reviewed the data as listed  .    Latest Ref Rng & Units 11/01/2024    9:14 AM 08/15/2024   10:30 AM 07/11/2024    9:20 AM  CBC  WBC 4.0 - 10.5 K/uL 3.2  3.3  3.4   Hemoglobin 12.0 - 15.0 g/dL 8.8  9.7  9.1   Hematocrit 36.0 - 46.0 % 28.3  30.8  29.0   Platelets 150 - 400 K/uL 176  176  178     .    Latest Ref Rng & Units 11/01/2024    9:14 AM 08/15/2024   10:30 AM 07/11/2024    9:20 AM  CMP  Glucose 70 - 99 mg/dL 74  80  75   BUN 8 - 23  mg/dL 23  16  20    Creatinine 0.60 - 1.20 mg/dL 9.06  9.20  9.07   Sodium 135 - 145 mmol/L 136  134  133   Potassium 3.5 - 5.1 mmol/L 4.2  4.4  4.0   Chloride 98 - 111 mmol/L 104  105  103   CO2 22 - 32 mmol/L 25  27  28    Calcium  8.9 - 10.3 mg/dL 89.9  9.2  9.9   Total Protein 6.5 - 8.1 g/dL 88.2  88.3  88.9   Total Bilirubin 0.0 - 1.2 mg/dL RESULTS UNAVAILABLE DUE TO INTERFERING SUBSTANCE  0.2  0.2   Alkaline Phos 38 - 126 U/L 36  34  29   AST 15 - 41 U/L 19  15  14    ALT 10 - 47 U/L 9  7  6     MULTIPLE MYELOMA 01/2023 - 08/2024 & KAPPA/LAMBDA LIGHT CHAINS 07/2019 - 08/2024       09/02/2019 Bone Marrow Biopsy ([id number])    08/05/2019 Bone Marrow Biopsy ([id number])         RADIOGRAPHIC STUDIES: I have personally reviewed the radiological images as listed and agreed with the findings in the report. No results found.  ASSESSMENT & PLAN:  89 y.o. female with   #1 IgG Kappa Monoclonal paraproteinemia with M protein of 4.9 -highly concerning for MM   06/14/2019 SPEP revealed total protein at 10.1, alpha 2 at 1.0, gamma globulin at 4.1, Abnormal protein band1 at 3.9 UPEP +ve for Bence Jones protein   08/08/2019 PET scan with results revealing Two right thyroid  nodules are both highly hypermetabolic. A significant minority of hypermetabolic thyroid  lesions represent thyroid  malignancy. Further workup with thyroid  ultrasound is recommended. A small left upper para-aortic lymph node is mildly hypermetabolic with maximum SUV 4.7, significance uncertain. No significant abnormal accentuated osseous activity or compelling focal findings of active multiple myeloma in the skeleton. Hypodense lesions in the liver are photopenic and likely benign Hyperdense small lesions of the left kidney upper pole are too small to characterize but statistically most likely to be complex cysts. If the patient has hematuria or if otherwise clinically warranted, renal protocol MRI with and without contrast  could be utilized for further characterization. Mild mid to distal accentuated thoracic esophageal activity with maximum SUV of 4.8; mildly accentuated activity in the gastric cardia/gastroesophageal junction with maximum SUV 5.8. Both could be physiologic but rarely low-grade malignancy could have a similar appearance, upper endoscopy could be utilized for further workup if clinically warranted. Other imaging findings of potential clinical significance: Chronic ischemic microvascular white matter disease intracranially. Aortic Atherosclerosis (ICD10-I70.0). Mild cardiomegaly. Biapical pleuroparenchymal scarring and probable scarring  in the superior segment left lower lobe. Calcified uterine fibroids. Thoracolumbar scoliosis. No clear bone lesions.   08/05/2019 bone marrow biopsy ([id number]) with results revealing no monoclonal b cell population or abnormal T cell phenotype identified. Atypical lymphoplasmacytoid aggregates - not clear for myeloma   09/02/2019 bone marrow biopsy ([id number]) with results revealing small popoulation of monoclonal plasma cell. Does not appear to co-related with M protein levels and seem to be underrepresented.   #2. FDG avid thyroid  nodules   #3.  Patient Active Problem List   Diagnosis Date Noted   Weight loss 08/05/2024   Multiple myeloma not having achieved remission (HCC) 02/10/2022   Depression, major, single episode, complete remission 02/13/2020   Smoldering myeloma 10/17/2019   Vitamin D  deficiency 10/17/2019   Dementia without behavioral disturbance (HCC) 07/01/2017   Mixed hyperlipidemia 11/29/2013   Osteoporosis 03/15/2013   BRADYCARDIA 02/17/2009   HYPERTENSION, BENIGN ESSENTIAL 02/16/2009   ANEMIA, HYPOCHROMIC 07/26/2007   DISORDER, TOBACCO USE 07/26/2007   DEPRESSION 07/26/2007   CARDIAC CATHETERIZATION, LEFT, HX OF 07/26/2007   PLAN - Patient's labs from 11/01/2024 were discussed in details with her and her daughter CBC shows anemia with  hemoglobin of 8.8 with an MCV of 86 WBC count of 3.2k and platelets of 176k CMP shows normal creatinine of 0.93 with a calcium  of 10.  Total protein level significantly elevated at 11.7 Ferritin of 115 with an iron saturation of 17% Myeloma panel shows M spike of 4.9 elevated IgG level 7263. Kappa free light chains are elevated to 470 with a kappa lambda ratio of 71 which also suggests progression. Erythropoietin  levels of 13.9 Recommend patient consider repeat bone marrow aspiration and biopsy to confirm diagnosis of likely active myeloma versus high risk smoldering myeloma. We might need to consider initiating treatment with daratumumab/dexamethasone especially in the context of worsening anemia. Patient was recommended to start oral iron and polysaccharide 150 mg p.o. 3 times a week and if tolerated then can switch to daily. Will offer the option of IV iron. Patient will discuss option for bone marrow biopsy and treatment considerations with her family over the holidays and let us  know.  Awaiting patient's choice.  and more than 50% was on counseling and direct patient cares.  The total time spent in the appointment was 30 minutes*.  All of the patient's questions were answered with apparent satisfaction. The patient knows to call the clinic with any problems, questions or concerns.   Emaline Saran MD MS AAHIVMS [facility] Hematology/Oncology Physician [facility]  .*Total Encounter Time as defined by the Centers for Medicare and Medicaid Services includes, in addition to the face-to-face time of a patient visit (documented in the note above) non-face-to-face time: obtaining and reviewing outside history, ordering and reviewing medications, tests or procedures, care coordination (communications with other health care professionals or caregivers) and documentation in the medical record.    I, Marijo Sharps, acting as a neurosurgeon for Emaline Saran, MD., have documented all relevant  documentation on the behalf of Emaline Saran, MD, as directed by  Emaline Saran, MD while in the presence of Emaline Saran, MD.   Emaline Saran MD MS AAHIVMS [facility] Hematology/Oncology Physician [facility]  (Office):       [phone] (Work cell):  [phone] (Fax):           [phone]

## 2024-12-06 NOTE — Progress Notes (Signed)
 This encounter was created in error - please disregard.

## 2024-12-11 ENCOUNTER — Other Ambulatory Visit: Payer: Self-pay | Admitting: Nurse Practitioner

## 2024-12-11 DIAGNOSIS — R634 Abnormal weight loss: Secondary | ICD-10-CM

## 2025-01-23 ENCOUNTER — Inpatient Hospital Stay: Attending: Hematology

## 2025-01-30 ENCOUNTER — Inpatient Hospital Stay: Admitting: Hematology

## 2025-02-10 ENCOUNTER — Ambulatory Visit: Admitting: Nurse Practitioner

## 2025-08-28 ENCOUNTER — Ambulatory Visit: Payer: Self-pay | Admitting: Nurse Practitioner
